# Patient Record
Sex: Female | Born: 1955
Health system: Southern US, Community
[De-identification: ages and names within clinical notes are randomized; demographics above are authoritative.]

## PROBLEM LIST (undated history)

## (undated) DIAGNOSIS — T4145XA Adverse effect of unspecified anesthetic, initial encounter: Secondary | ICD-10-CM

## (undated) DIAGNOSIS — T8859XA Other complications of anesthesia, initial encounter: Secondary | ICD-10-CM

## (undated) DIAGNOSIS — E119 Type 2 diabetes mellitus without complications: Secondary | ICD-10-CM

## (undated) DIAGNOSIS — N951 Menopausal and female climacteric states: Secondary | ICD-10-CM

## (undated) DIAGNOSIS — M7750 Other enthesopathy of unspecified foot: Secondary | ICD-10-CM

## (undated) DIAGNOSIS — M109 Gout, unspecified: Secondary | ICD-10-CM

## (undated) DIAGNOSIS — R740 Nonspecific elevation of levels of transaminase and lactic acid dehydrogenase [LDH]: Secondary | ICD-10-CM

## (undated) DIAGNOSIS — K760 Fatty (change of) liver, not elsewhere classified: Principal | ICD-10-CM

## (undated) DIAGNOSIS — K589 Irritable bowel syndrome without diarrhea: Secondary | ICD-10-CM

## (undated) DIAGNOSIS — I10 Essential (primary) hypertension: Secondary | ICD-10-CM

## (undated) DIAGNOSIS — K529 Noninfective gastroenteritis and colitis, unspecified: Secondary | ICD-10-CM

## (undated) DIAGNOSIS — G35 Multiple sclerosis: Secondary | ICD-10-CM

## (undated) DIAGNOSIS — K7581 Nonalcoholic steatohepatitis (NASH): Secondary | ICD-10-CM

## (undated) DIAGNOSIS — Z86010 Personal history of colonic polyps: Secondary | ICD-10-CM

## (undated) DIAGNOSIS — Z973 Presence of spectacles and contact lenses: Secondary | ICD-10-CM

## (undated) DIAGNOSIS — K219 Gastro-esophageal reflux disease without esophagitis: Secondary | ICD-10-CM

## (undated) DIAGNOSIS — Z9889 Other specified postprocedural states: Secondary | ICD-10-CM

## (undated) DIAGNOSIS — B009 Herpesviral infection, unspecified: Secondary | ICD-10-CM

## (undated) DIAGNOSIS — E785 Hyperlipidemia, unspecified: Secondary | ICD-10-CM

## (undated) DIAGNOSIS — E039 Hypothyroidism, unspecified: Secondary | ICD-10-CM

## (undated) DIAGNOSIS — I82409 Acute embolism and thrombosis of unspecified deep veins of unspecified lower extremity: Secondary | ICD-10-CM

## (undated) DIAGNOSIS — R112 Nausea with vomiting, unspecified: Secondary | ICD-10-CM

## (undated) DIAGNOSIS — I2699 Other pulmonary embolism without acute cor pulmonale: Secondary | ICD-10-CM

## (undated) DIAGNOSIS — G589 Mononeuropathy, unspecified: Secondary | ICD-10-CM

## (undated) DIAGNOSIS — R7401 Elevation of levels of liver transaminase levels: Secondary | ICD-10-CM

## (undated) DIAGNOSIS — E8840 Mitochondrial metabolism disorder, unspecified: Secondary | ICD-10-CM

## (undated) HISTORY — DX: Menopausal and female climacteric states: N95.1

## (undated) HISTORY — DX: Irritable bowel syndrome, unspecified: K58.9

## (undated) HISTORY — DX: Gastro-esophageal reflux disease without esophagitis: K21.9

## (undated) HISTORY — DX: Herpesviral infection, unspecified: B00.9

## (undated) HISTORY — DX: Fatty (change of) liver, not elsewhere classified: K76.0

## (undated) HISTORY — DX: Mitochondrial metabolism disorder, unspecified: E88.40

## (undated) HISTORY — DX: Multiple sclerosis: G35

## (undated) HISTORY — DX: Noninfective gastroenteritis and colitis, unspecified: K52.9

## (undated) HISTORY — DX: Gout, unspecified: M10.9

## (undated) HISTORY — DX: Type 2 diabetes mellitus without complications: E11.9

## (undated) HISTORY — DX: Nonspecific elevation of levels of transaminase and lactic acid dehydrogenase (ldh): R74.0

## (undated) HISTORY — DX: Acute embolism and thrombosis of unspecified deep veins of unspecified lower extremity: I82.409

## (undated) HISTORY — DX: Essential (primary) hypertension: I10

## (undated) HISTORY — DX: Hyperlipidemia, unspecified: E78.5

## (undated) HISTORY — DX: Hypothyroidism, unspecified: E03.9

## (undated) HISTORY — PX: COLONOSCOPY: SHX174

## (undated) HISTORY — DX: Other enthesopathy of unspecified foot and ankle: M77.50

## (undated) HISTORY — DX: Other pulmonary embolism without acute cor pulmonale: I26.99

## (undated) HISTORY — PX: TONSILLECTOMY: SUR1361

## (undated) HISTORY — DX: Elevation of levels of liver transaminase levels: R74.01

## (undated) HISTORY — DX: Mononeuropathy, unspecified: G58.9

---

## 1988-06-23 HISTORY — PX: THYROIDECTOMY, PARTIAL: SHX18

## 2002-06-23 HISTORY — PX: ABDOMINOPLASTY: SUR9

## 2002-06-23 HISTORY — PX: CHOLECYSTECTOMY: SHX55

## 2005-06-12 ENCOUNTER — Other Ambulatory Visit: Admission: RE | Admit: 2005-06-12 | Discharge: 2005-06-12 | Payer: Self-pay | Admitting: Internal Medicine

## 2005-08-18 ENCOUNTER — Encounter: Admission: RE | Admit: 2005-08-18 | Discharge: 2005-08-18 | Payer: Self-pay | Admitting: Internal Medicine

## 2005-10-07 ENCOUNTER — Encounter: Admission: RE | Admit: 2005-10-07 | Discharge: 2005-10-07 | Payer: Self-pay | Admitting: Internal Medicine

## 2005-12-26 ENCOUNTER — Emergency Department (HOSPITAL_COMMUNITY): Admission: EM | Admit: 2005-12-26 | Discharge: 2005-12-26 | Payer: Self-pay | Admitting: Emergency Medicine

## 2006-01-27 ENCOUNTER — Ambulatory Visit (HOSPITAL_COMMUNITY): Admission: RE | Admit: 2006-01-27 | Discharge: 2006-01-27 | Payer: Self-pay | Admitting: Internal Medicine

## 2006-04-03 ENCOUNTER — Ambulatory Visit (HOSPITAL_COMMUNITY): Admission: RE | Admit: 2006-04-03 | Discharge: 2006-04-03 | Payer: Self-pay | Admitting: Internal Medicine

## 2006-10-02 ENCOUNTER — Encounter: Admission: RE | Admit: 2006-10-02 | Discharge: 2006-10-02 | Payer: Self-pay | Admitting: Gastroenterology

## 2006-11-04 ENCOUNTER — Encounter (INDEPENDENT_AMBULATORY_CARE_PROVIDER_SITE_OTHER): Payer: Self-pay | Admitting: Specialist

## 2006-11-04 ENCOUNTER — Ambulatory Visit (HOSPITAL_COMMUNITY): Admission: RE | Admit: 2006-11-04 | Discharge: 2006-11-04 | Payer: Self-pay | Admitting: Gastroenterology

## 2006-11-22 DIAGNOSIS — I2699 Other pulmonary embolism without acute cor pulmonale: Secondary | ICD-10-CM

## 2006-11-22 DIAGNOSIS — I82409 Acute embolism and thrombosis of unspecified deep veins of unspecified lower extremity: Secondary | ICD-10-CM

## 2006-11-22 HISTORY — PX: OTHER SURGICAL HISTORY: SHX169

## 2006-11-22 HISTORY — DX: Acute embolism and thrombosis of unspecified deep veins of unspecified lower extremity: I82.409

## 2006-11-22 HISTORY — DX: Other pulmonary embolism without acute cor pulmonale: I26.99

## 2006-12-04 ENCOUNTER — Encounter (INDEPENDENT_AMBULATORY_CARE_PROVIDER_SITE_OTHER): Payer: Self-pay | Admitting: Surgery

## 2006-12-04 ENCOUNTER — Inpatient Hospital Stay (HOSPITAL_COMMUNITY): Admission: RE | Admit: 2006-12-04 | Discharge: 2006-12-10 | Payer: Self-pay | Admitting: Surgery

## 2006-12-13 ENCOUNTER — Encounter (INDEPENDENT_AMBULATORY_CARE_PROVIDER_SITE_OTHER): Payer: Self-pay | Admitting: Surgery

## 2006-12-13 ENCOUNTER — Inpatient Hospital Stay (HOSPITAL_COMMUNITY): Admission: EM | Admit: 2006-12-13 | Discharge: 2006-12-28 | Payer: Self-pay | Admitting: Emergency Medicine

## 2006-12-13 ENCOUNTER — Ambulatory Visit: Payer: Self-pay | Admitting: Pulmonary Disease

## 2006-12-28 ENCOUNTER — Encounter: Payer: Self-pay | Admitting: Pulmonary Disease

## 2006-12-28 ENCOUNTER — Ambulatory Visit: Payer: Self-pay | Admitting: Vascular Surgery

## 2006-12-29 ENCOUNTER — Ambulatory Visit: Payer: Self-pay | Admitting: Pulmonary Disease

## 2006-12-30 ENCOUNTER — Ambulatory Visit: Payer: Self-pay | Admitting: Pulmonary Disease

## 2006-12-31 ENCOUNTER — Ambulatory Visit: Payer: Self-pay | Admitting: Pulmonary Disease

## 2006-12-31 LAB — CONVERTED CEMR LAB: Prothrombin Time: 16.2 s — ABNORMAL HIGH (ref 10.0–14.0)

## 2007-01-01 ENCOUNTER — Encounter: Admission: RE | Admit: 2007-01-01 | Discharge: 2007-01-01 | Payer: Self-pay | Admitting: Surgery

## 2007-01-01 ENCOUNTER — Ambulatory Visit: Payer: Self-pay | Admitting: Pulmonary Disease

## 2007-01-01 LAB — CONVERTED CEMR LAB
AST: 36 units/L (ref 0–37)
Albumin: 3.1 g/dL — ABNORMAL LOW (ref 3.5–5.2)
Alkaline Phosphatase: 213 units/L — ABNORMAL HIGH (ref 39–117)
BUN: 9 mg/dL (ref 6–23)
Basophils Absolute: 0 10*3/uL (ref 0.0–0.1)
Basophils Relative: 0.1 % (ref 0.0–1.0)
CO2: 22 meq/L (ref 19–32)
Chloride: 102 meq/L (ref 96–112)
Creatinine, Ser: 0.7 mg/dL (ref 0.4–1.2)
HCT: 35.2 % — ABNORMAL LOW (ref 36.0–46.0)
Hemoglobin: 12.1 g/dL (ref 12.0–15.0)
INR: 1.8 (ref 0.9–2.0)
Monocytes Absolute: 0.4 10*3/uL (ref 0.2–0.7)
Neutrophils Relative %: 71.8 % (ref 43.0–77.0)
Potassium: 5.1 meq/L (ref 3.5–5.1)
Prothrombin Time: 16.6 s — ABNORMAL HIGH (ref 10.0–14.0)
RBC: 4.01 M/uL (ref 3.87–5.11)
RDW: 14.4 % (ref 11.5–14.6)
Sodium: 138 meq/L (ref 135–145)
Total Bilirubin: 1.3 mg/dL — ABNORMAL HIGH (ref 0.3–1.2)
Total Protein: 7.5 g/dL (ref 6.0–8.3)

## 2007-01-04 ENCOUNTER — Ambulatory Visit: Payer: Self-pay | Admitting: Pulmonary Disease

## 2007-01-04 LAB — CONVERTED CEMR LAB
Basophils Absolute: 0.1 10*3/uL (ref 0.0–0.1)
Eosinophils Absolute: 0.2 10*3/uL (ref 0.0–0.6)
Eosinophils Relative: 3.2 % (ref 0.0–5.0)
HCT: 37.4 % (ref 36.0–46.0)
Hemoglobin: 12.8 g/dL (ref 12.0–15.0)
Lymphocytes Relative: 21.3 % (ref 12.0–46.0)
MCHC: 34.3 g/dL (ref 30.0–36.0)
MCV: 87.5 fL (ref 78.0–100.0)
Monocytes Absolute: 0.5 10*3/uL (ref 0.2–0.7)
Neutro Abs: 4.2 10*3/uL (ref 1.4–7.7)
Neutrophils Relative %: 66.8 % (ref 43.0–77.0)
Prothrombin Time: 22.9 s — ABNORMAL HIGH (ref 10.0–14.0)
WBC: 6.4 10*3/uL (ref 4.5–10.5)

## 2007-01-05 ENCOUNTER — Ambulatory Visit: Payer: Self-pay

## 2007-01-06 ENCOUNTER — Ambulatory Visit: Payer: Self-pay

## 2007-01-07 ENCOUNTER — Encounter: Admission: RE | Admit: 2007-01-07 | Discharge: 2007-01-07 | Payer: Self-pay | Admitting: Surgery

## 2007-01-10 ENCOUNTER — Emergency Department (HOSPITAL_COMMUNITY): Admission: EM | Admit: 2007-01-10 | Discharge: 2007-01-10 | Payer: Self-pay | Admitting: Emergency Medicine

## 2007-01-12 ENCOUNTER — Ambulatory Visit: Payer: Self-pay | Admitting: Pulmonary Disease

## 2007-01-15 ENCOUNTER — Ambulatory Visit: Payer: Self-pay | Admitting: Pulmonary Disease

## 2007-01-15 LAB — CONVERTED CEMR LAB
Basophils Relative: 0.9 % (ref 0.0–1.0)
Eosinophils Absolute: 0.2 10*3/uL (ref 0.0–0.6)
Eosinophils Relative: 3.6 % (ref 0.0–5.0)
HCT: 35.1 % — ABNORMAL LOW (ref 36.0–46.0)
Hemoglobin: 12.2 g/dL (ref 12.0–15.0)
INR: 2.2 — ABNORMAL HIGH (ref 0.9–2.0)
Lymphocytes Relative: 20 % (ref 12.0–46.0)
MCV: 86.7 fL (ref 78.0–100.0)
Platelets: 412 10*3/uL — ABNORMAL HIGH (ref 150–400)
RBC: 4.05 M/uL (ref 3.87–5.11)

## 2007-01-21 ENCOUNTER — Ambulatory Visit: Payer: Self-pay | Admitting: Pulmonary Disease

## 2007-01-21 LAB — CONVERTED CEMR LAB
INR: 2.1 — ABNORMAL HIGH (ref 0.9–2.0)
Prothrombin Time: 18.1 s — ABNORMAL HIGH (ref 10.0–14.0)

## 2007-01-26 ENCOUNTER — Ambulatory Visit: Payer: Self-pay | Admitting: Pulmonary Disease

## 2007-01-26 LAB — CONVERTED CEMR LAB: INR: 2.2 — ABNORMAL HIGH (ref 0.9–2.0)

## 2007-02-17 ENCOUNTER — Ambulatory Visit: Payer: Self-pay | Admitting: Pulmonary Disease

## 2007-02-17 LAB — CONVERTED CEMR LAB
INR: 1.6 — ABNORMAL HIGH (ref 0.8–1.0)
Prothrombin Time: 15.8 s — ABNORMAL HIGH (ref 10.9–13.3)

## 2007-02-18 ENCOUNTER — Ambulatory Visit: Payer: Self-pay | Admitting: Pulmonary Disease

## 2007-02-18 LAB — CONVERTED CEMR LAB
ALT: 80 units/L — ABNORMAL HIGH (ref 0–35)
AST: 60 units/L — ABNORMAL HIGH (ref 0–37)
Albumin: 3.6 g/dL (ref 3.5–5.2)
Alkaline Phosphatase: 149 units/L — ABNORMAL HIGH (ref 39–117)
BUN: 9 mg/dL (ref 6–23)
Basophils Relative: 0.3 % (ref 0.0–1.0)
GFR calc Af Amer: 136 mL/min
GFR calc non Af Amer: 112 mL/min
Monocytes Relative: 12.2 % — ABNORMAL HIGH (ref 3.0–11.0)
Platelets: 267 10*3/uL (ref 150–400)
Potassium: 3.6 meq/L (ref 3.5–5.1)
RDW: 17.4 % — ABNORMAL HIGH (ref 11.5–14.6)
Total Bilirubin: 0.6 mg/dL (ref 0.3–1.2)
Total Protein: 6.9 g/dL (ref 6.0–8.3)

## 2007-02-26 ENCOUNTER — Ambulatory Visit: Payer: Self-pay | Admitting: Pulmonary Disease

## 2007-02-26 LAB — CONVERTED CEMR LAB: Prothrombin Time: 19.2 s — ABNORMAL HIGH (ref 10.9–13.3)

## 2007-03-29 ENCOUNTER — Ambulatory Visit: Payer: Self-pay | Admitting: Pulmonary Disease

## 2007-03-29 LAB — CONVERTED CEMR LAB
INR: 1.9 — ABNORMAL HIGH (ref 0.8–1.0)
Prothrombin Time: 17 s — ABNORMAL HIGH (ref 10.9–13.3)

## 2007-04-06 DIAGNOSIS — I82409 Acute embolism and thrombosis of unspecified deep veins of unspecified lower extremity: Secondary | ICD-10-CM | POA: Insufficient documentation

## 2007-04-06 DIAGNOSIS — E039 Hypothyroidism, unspecified: Secondary | ICD-10-CM | POA: Insufficient documentation

## 2007-04-06 DIAGNOSIS — G35 Multiple sclerosis: Secondary | ICD-10-CM

## 2007-04-06 DIAGNOSIS — F411 Generalized anxiety disorder: Secondary | ICD-10-CM | POA: Insufficient documentation

## 2007-04-06 DIAGNOSIS — K219 Gastro-esophageal reflux disease without esophagitis: Secondary | ICD-10-CM | POA: Insufficient documentation

## 2007-04-07 ENCOUNTER — Ambulatory Visit: Payer: Self-pay | Admitting: Pulmonary Disease

## 2007-04-10 DIAGNOSIS — I2699 Other pulmonary embolism without acute cor pulmonale: Secondary | ICD-10-CM

## 2007-04-24 HISTORY — PX: OTHER SURGICAL HISTORY: SHX169

## 2007-04-28 ENCOUNTER — Encounter (INDEPENDENT_AMBULATORY_CARE_PROVIDER_SITE_OTHER): Payer: Self-pay | Admitting: Surgery

## 2007-04-28 ENCOUNTER — Inpatient Hospital Stay (HOSPITAL_COMMUNITY): Admission: RE | Admit: 2007-04-28 | Discharge: 2007-05-03 | Payer: Self-pay | Admitting: Surgery

## 2007-05-07 ENCOUNTER — Encounter: Payer: Self-pay | Admitting: Cardiology

## 2007-05-07 LAB — CONVERTED CEMR LAB
INR: 1.1 — ABNORMAL HIGH (ref 0.8–1.0)
Prothrombin Time: 12.9 s (ref 10.9–13.3)

## 2007-05-10 ENCOUNTER — Encounter: Payer: Self-pay | Admitting: Pulmonary Disease

## 2007-05-10 ENCOUNTER — Encounter: Payer: Self-pay | Admitting: Cardiology

## 2007-05-10 LAB — CONVERTED CEMR LAB
Prothrombin Time: 16.1 s — ABNORMAL HIGH (ref 10.9–13.3)
Prothrombin Time: 16.1 s — ABNORMAL HIGH (ref 10.9–13.3)

## 2007-05-12 ENCOUNTER — Ambulatory Visit: Payer: Self-pay

## 2007-05-12 LAB — CONVERTED CEMR LAB: INR: 1.9 — ABNORMAL HIGH (ref 0.8–1.0)

## 2007-05-14 ENCOUNTER — Ambulatory Visit: Payer: Self-pay | Admitting: Pulmonary Disease

## 2007-05-24 ENCOUNTER — Ambulatory Visit: Payer: Self-pay | Admitting: Pulmonary Disease

## 2007-05-24 LAB — CONVERTED CEMR LAB: INR: 2.5 — ABNORMAL HIGH (ref 0.8–1.0)

## 2007-06-04 ENCOUNTER — Ambulatory Visit: Payer: Self-pay | Admitting: Pulmonary Disease

## 2007-06-04 LAB — CONVERTED CEMR LAB: Prothrombin Time: 16.5 s — ABNORMAL HIGH (ref 10.9–13.3)

## 2007-06-07 LAB — CONVERTED CEMR LAB: INR: 1.8 — ABNORMAL HIGH (ref 0.8–1.0)

## 2007-06-11 ENCOUNTER — Ambulatory Visit: Payer: Self-pay | Admitting: Pulmonary Disease

## 2007-07-14 ENCOUNTER — Ambulatory Visit: Payer: Self-pay | Admitting: Pulmonary Disease

## 2007-07-14 LAB — CONVERTED CEMR LAB: Prothrombin Time: 17.1 s — ABNORMAL HIGH (ref 10.9–13.3)

## 2007-07-21 ENCOUNTER — Encounter: Payer: Self-pay | Admitting: Pulmonary Disease

## 2007-07-21 ENCOUNTER — Ambulatory Visit: Payer: Self-pay | Admitting: Pulmonary Disease

## 2007-08-04 ENCOUNTER — Ambulatory Visit: Payer: Self-pay | Admitting: Pulmonary Disease

## 2007-08-25 ENCOUNTER — Ambulatory Visit: Payer: Self-pay | Admitting: Pulmonary Disease

## 2007-08-25 LAB — CONVERTED CEMR LAB
INR: 2.3 — ABNORMAL HIGH (ref 0.8–1.0)
Prothrombin Time: 18.9 s — ABNORMAL HIGH (ref 10.9–13.3)

## 2007-09-03 ENCOUNTER — Ambulatory Visit: Payer: Self-pay | Admitting: Internal Medicine

## 2007-09-07 ENCOUNTER — Encounter: Payer: Self-pay | Admitting: Internal Medicine

## 2007-10-04 ENCOUNTER — Ambulatory Visit: Payer: Self-pay | Admitting: Cardiology

## 2008-02-10 ENCOUNTER — Ambulatory Visit: Payer: Self-pay | Admitting: Pulmonary Disease

## 2008-02-10 LAB — CONVERTED CEMR LAB: Prothrombin Time: 25.1 s — ABNORMAL HIGH (ref 10.9–13.3)

## 2008-02-14 ENCOUNTER — Encounter: Admission: RE | Admit: 2008-02-14 | Discharge: 2008-02-14 | Payer: Self-pay | Admitting: Internal Medicine

## 2008-03-30 ENCOUNTER — Ambulatory Visit (HOSPITAL_COMMUNITY): Admission: RE | Admit: 2008-03-30 | Discharge: 2008-03-30 | Payer: Self-pay | Admitting: Internal Medicine

## 2008-04-05 ENCOUNTER — Encounter: Admission: RE | Admit: 2008-04-05 | Discharge: 2008-04-05 | Payer: Self-pay | Admitting: Internal Medicine

## 2008-04-10 IMAGING — CT CT ABDOMEN W/ CM
2 of 5 series · 16 of 46 positions shown, 18 images · IV contrast (READICAT/WATER)
Comparison: 01/01/2007

ABDOMEN CT WITH CONTRAST:

CLINICAL DATA: Right colectomy. Abscess.
TECHNIQUE: Multidetector CT imaging of the abdomen and pelvis was performed
following the standard protocol during bolus administration of intravenous
contrast.

Contrast:  100 cc Omnipaque 300

[Series 2: abdomen w/ · axial · 0.86mm/px · z∈[-422,+3]mm · 13 of 97 slices shown, 15 images]
[im 6/97  soft-tissue]
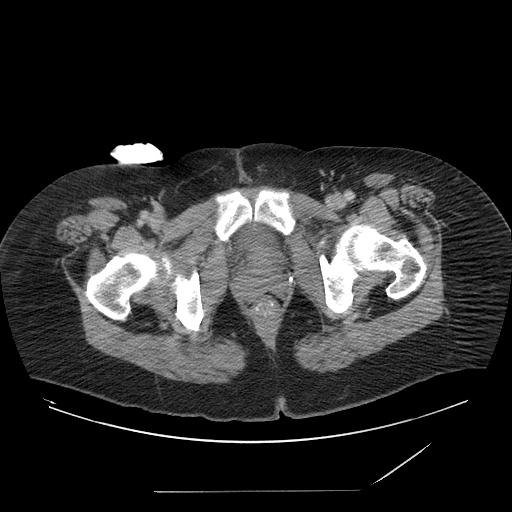
[im 6/97  bone]
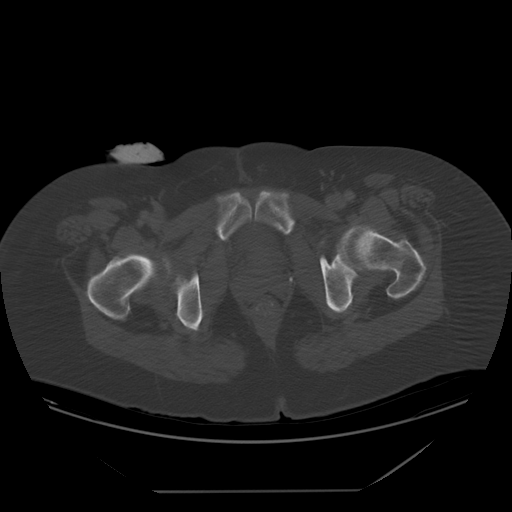
[im 16/97  soft-tissue]
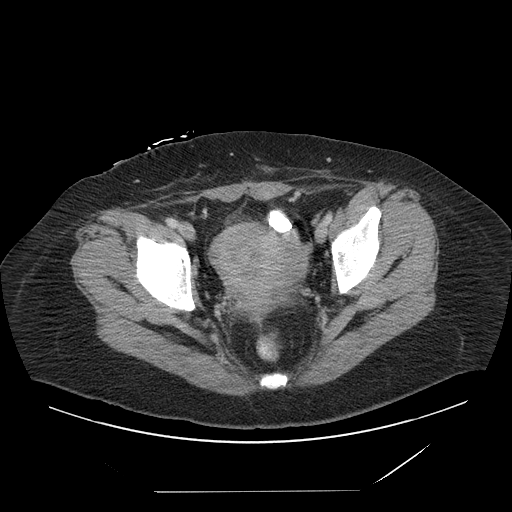
[im 21/97  soft-tissue]
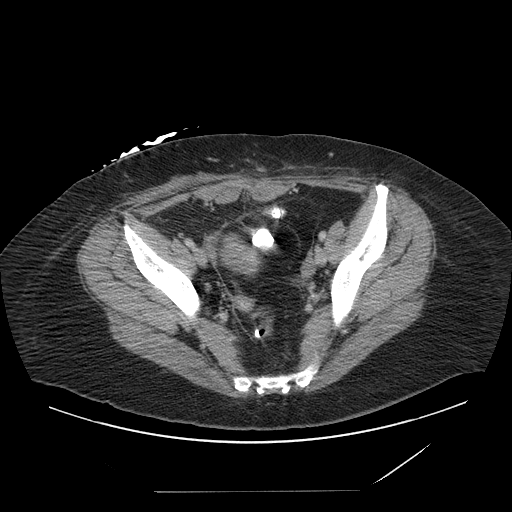
[im 26/97  soft-tissue]
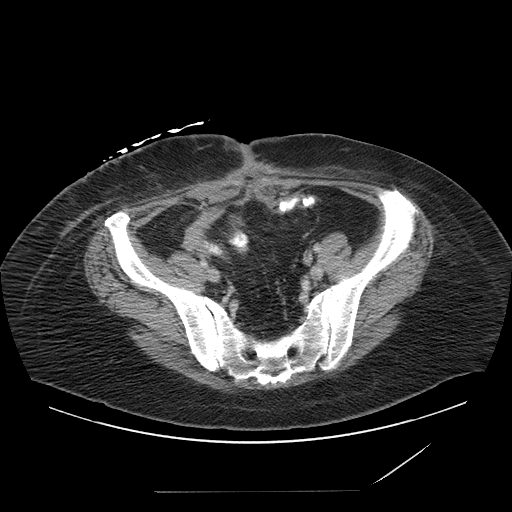
[im 36/97  soft-tissue]
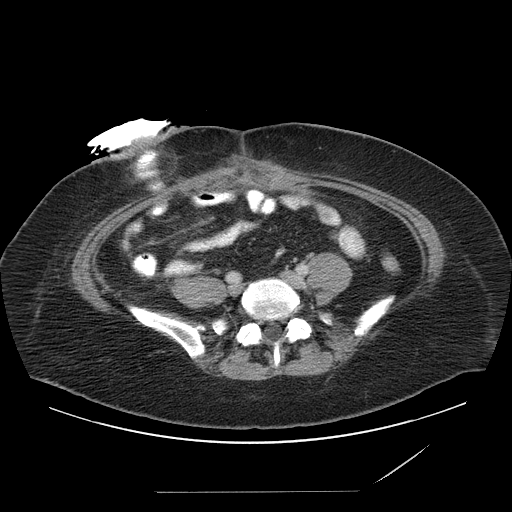
[im 41/97  soft-tissue]
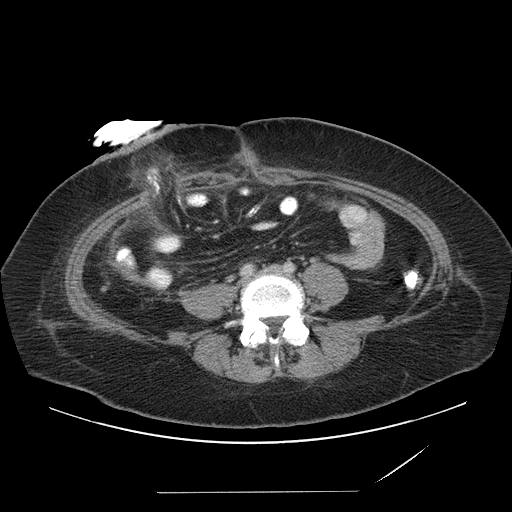
[im 51/97  soft-tissue]
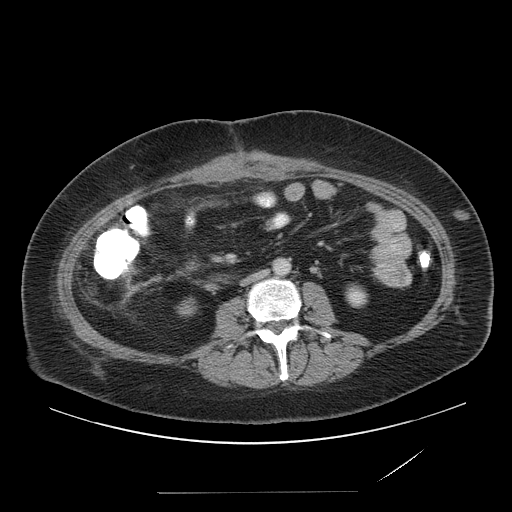
[im 56/97  soft-tissue]
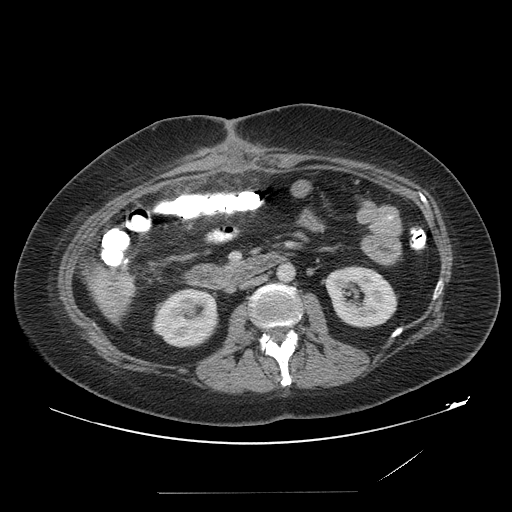
[im 61/97  soft-tissue]
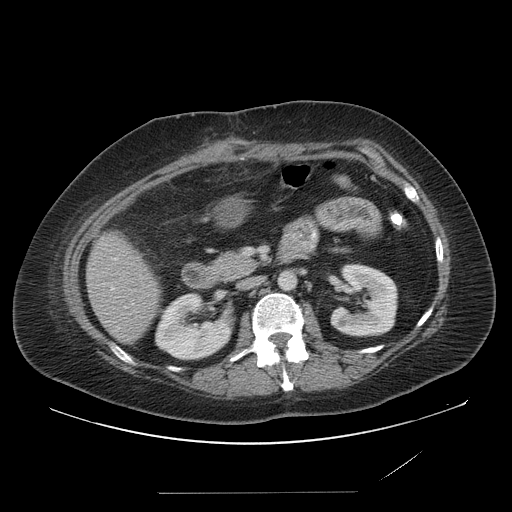
[im 61/97  bone]
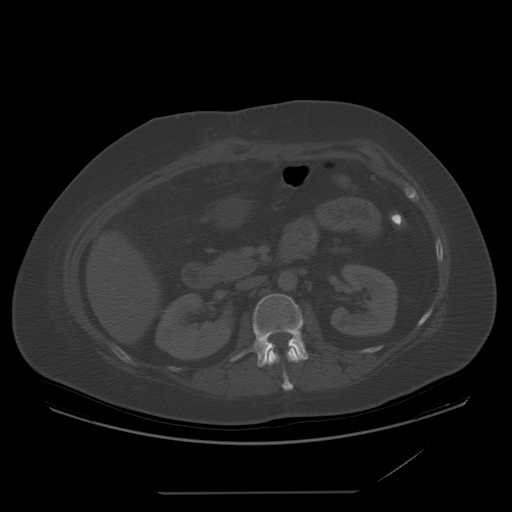
[im 71/97  soft-tissue]
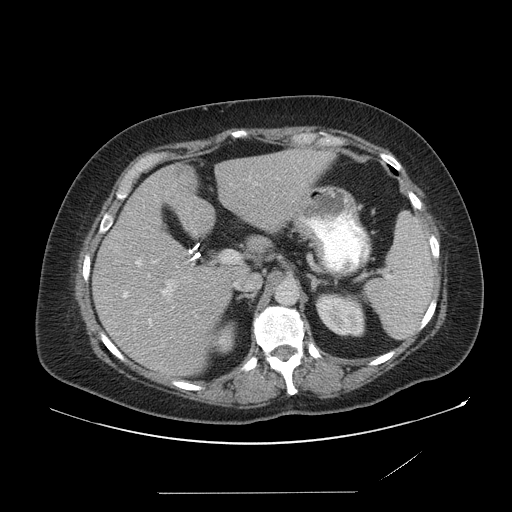
[im 76/97  soft-tissue]
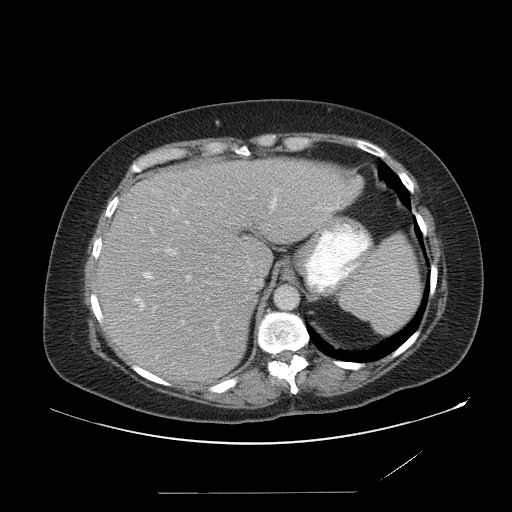
[im 81/97  soft-tissue]
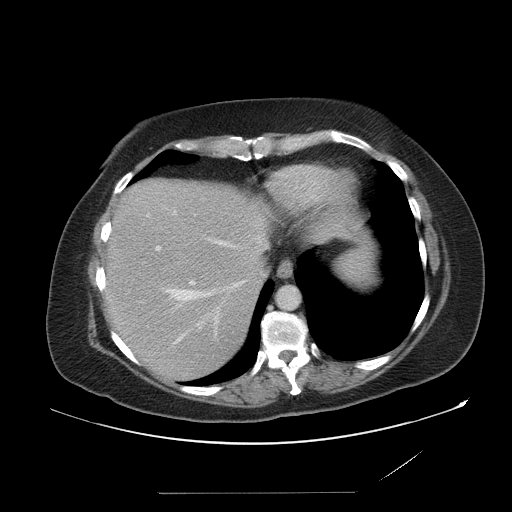
[im 91/97  soft-tissue]
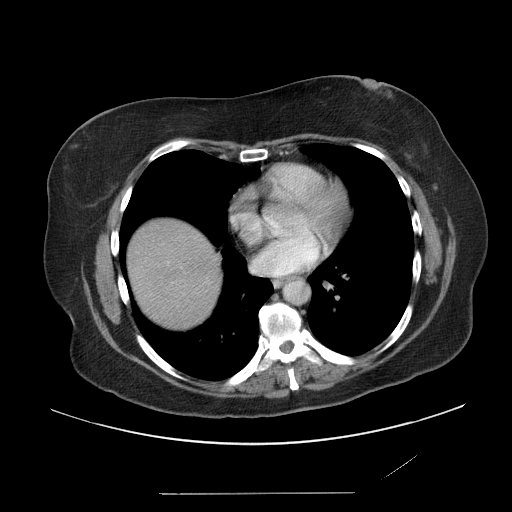

[Series 401: coronal · coronal · 0.99mm/px · 3 of 137 slices shown]
[im 46/137  soft-tissue]
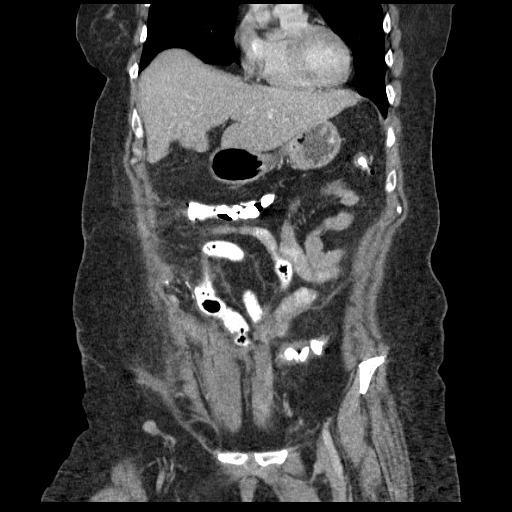
[im 61/137  soft-tissue]
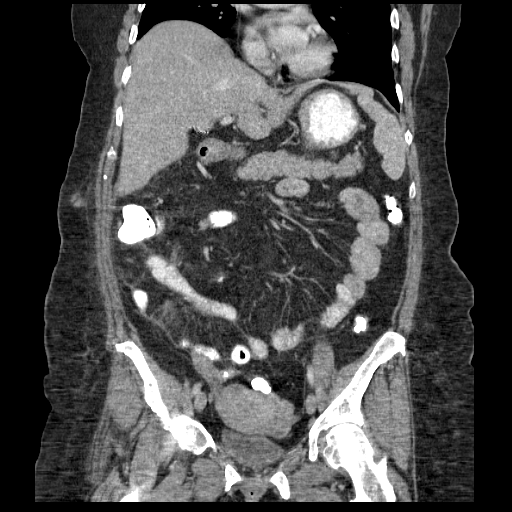
[im 76/137  soft-tissue]
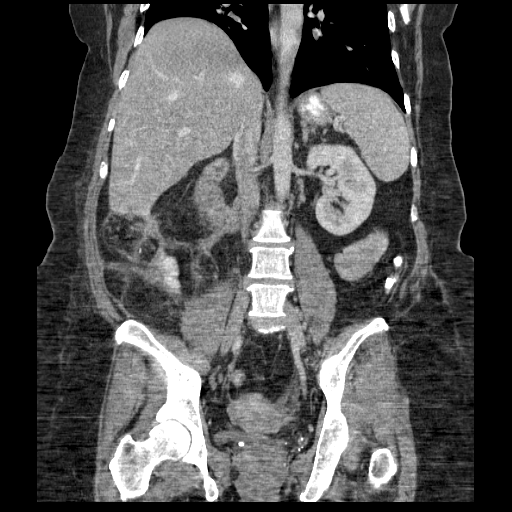

[16 of 46 positions shown; findings below may reference images not displayed]

FINDINGS: Focal low attenuation in the liver at the falciform ligament is in a
characteristic location for focal fatty infiltration. The spleen, stomach,
duodenum, pancreas, adrenal glands, and kidneys have normal imaging features.
Small lymph nodes in the hepatoduodenal ligament are stable. There is no
abdominal aortic aneurysm.

There is a persistent soft tissue focus in the right colectomy bed on today's
study, measuring 3.9 x 1.1 cm compared to 4.1 x 1.7 cm previously. I cannot say
at this time that this is a fluid collection and may represent an area of
residual inflammation/granulation. It does appear slightly smaller than on the
previous exam.

There is ill-definition of fascial planes in the midline anterior abdominal
wall. Several tiny air locules are seen in the soft tissues superficial to the
rectus fascia, at the midline. This may be related to a residual midline wound.
The patient did appear to have some packing material in the midline on the
previous study. There is not a focal fluid collection in the ventral abdominal
wall to suggest an associated abdominal wall abscess.
IMPRESSION: Interval slight decrease in size of the previous small fluid collection in the
right colectomy bed. This does not have features on today's study for a definite
fluid collection at this location and may represent focal
inflammation/granulation. 

There is persistent gas in the midline anterior abdominal wall presumably
secondary to a midline wound. No associated fluid collection is identified in
this region although infection is a possibility.

PELVIS CT WITH CONTRAST:
FINDINGS: There is no intraperitoneal free fluid. No pelvic lymphadenopathy.
Fibroid changes seen in the uterus. There is no adnexal mass. Right lower
quadrant ileostomy is stable. Some mild stranding in the deep right hemipelvis
is stable in the interval.

Degenerative changes are noted in the facets of the lower lumbar spine.
IMPRESSION: No appreciable interval change.

## 2008-04-27 ENCOUNTER — Ambulatory Visit (HOSPITAL_COMMUNITY): Admission: RE | Admit: 2008-04-27 | Discharge: 2008-04-27 | Payer: Self-pay | Admitting: Internal Medicine

## 2008-05-03 ENCOUNTER — Ambulatory Visit: Payer: Self-pay | Admitting: Cardiology

## 2008-05-19 ENCOUNTER — Encounter: Payer: Self-pay | Admitting: Cardiology

## 2008-05-19 ENCOUNTER — Ambulatory Visit: Payer: Self-pay | Admitting: Cardiology

## 2008-08-08 ENCOUNTER — Inpatient Hospital Stay (HOSPITAL_COMMUNITY): Admission: RE | Admit: 2008-08-08 | Discharge: 2008-08-11 | Payer: Self-pay | Admitting: Surgery

## 2008-08-17 ENCOUNTER — Encounter: Payer: Self-pay | Admitting: Internal Medicine

## 2008-09-06 ENCOUNTER — Encounter: Payer: Self-pay | Admitting: Internal Medicine

## 2008-09-21 ENCOUNTER — Telehealth: Payer: Self-pay | Admitting: Internal Medicine

## 2008-09-21 DIAGNOSIS — Z860100 Personal history of colon polyps, unspecified: Secondary | ICD-10-CM | POA: Insufficient documentation

## 2008-09-21 HISTORY — DX: Personal history of colon polyps, unspecified: Z86.0100

## 2008-09-21 HISTORY — DX: Personal history of colonic polyps: Z86.010

## 2008-09-25 ENCOUNTER — Encounter: Payer: Self-pay | Admitting: Internal Medicine

## 2008-09-28 ENCOUNTER — Encounter: Admission: RE | Admit: 2008-09-28 | Discharge: 2008-09-28 | Payer: Self-pay | Admitting: Internal Medicine

## 2008-10-04 ENCOUNTER — Encounter (INDEPENDENT_AMBULATORY_CARE_PROVIDER_SITE_OTHER): Payer: Self-pay

## 2008-10-19 ENCOUNTER — Encounter (INDEPENDENT_AMBULATORY_CARE_PROVIDER_SITE_OTHER): Payer: Self-pay

## 2008-11-16 ENCOUNTER — Encounter: Payer: Self-pay | Admitting: Internal Medicine

## 2008-11-17 ENCOUNTER — Ambulatory Visit: Payer: Self-pay | Admitting: Internal Medicine

## 2008-11-17 ENCOUNTER — Encounter: Payer: Self-pay | Admitting: Internal Medicine

## 2008-11-17 ENCOUNTER — Ambulatory Visit (HOSPITAL_COMMUNITY): Admission: RE | Admit: 2008-11-17 | Discharge: 2008-11-17 | Payer: Self-pay | Admitting: Internal Medicine

## 2008-11-23 DIAGNOSIS — E785 Hyperlipidemia, unspecified: Secondary | ICD-10-CM | POA: Insufficient documentation

## 2008-11-24 ENCOUNTER — Ambulatory Visit: Payer: Self-pay | Admitting: Internal Medicine

## 2008-12-01 LAB — CONVERTED CEMR LAB
Cholesterol: 229 mg/dL — ABNORMAL HIGH (ref 0–200)
HDL: 30.2 mg/dL — ABNORMAL LOW (ref >39.00)
Total CHOL/HDL Ratio: 8
Triglycerides: 310 mg/dL — ABNORMAL HIGH (ref 0.0–149.0)
VLDL: 62 mg/dL — ABNORMAL HIGH (ref 0.0–40.0)

## 2009-02-07 ENCOUNTER — Ambulatory Visit: Payer: Self-pay | Admitting: Internal Medicine

## 2009-02-08 LAB — CONVERTED CEMR LAB
Basophils Absolute: 0 10*3/uL (ref 0.0–0.1)
Chloride: 101 meq/L (ref 96–112)
Eosinophils Absolute: 0.2 10*3/uL (ref 0.0–0.7)
GFR calc non Af Amer: 69.38 mL/min (ref >60)
Lymphocytes Relative: 20.9 % (ref 12.0–46.0)
MCHC: 33.4 g/dL (ref 30.0–36.0)
Neutrophils Relative %: 69.3 % (ref 43.0–77.0)
Platelets: 311 10*3/uL (ref 150.0–400.0)
Potassium: 3.5 meq/L (ref 3.5–5.1)
RDW: 13.5 % (ref 11.5–14.6)

## 2009-02-16 ENCOUNTER — Ambulatory Visit: Payer: Self-pay | Admitting: Internal Medicine

## 2009-02-16 ENCOUNTER — Encounter (INDEPENDENT_AMBULATORY_CARE_PROVIDER_SITE_OTHER): Payer: Self-pay

## 2009-02-16 DIAGNOSIS — E119 Type 2 diabetes mellitus without complications: Secondary | ICD-10-CM

## 2009-02-16 DIAGNOSIS — I10 Essential (primary) hypertension: Secondary | ICD-10-CM

## 2009-02-16 LAB — CONVERTED CEMR LAB
Cholesterol: 216 mg/dL — ABNORMAL HIGH (ref 0–200)
HDL: 32.3 mg/dL — ABNORMAL LOW (ref >39.00)
Hgb A1c MFr Bld: 6.1 % (ref 4.6–6.5)
VLDL: 67.4 mg/dL — ABNORMAL HIGH (ref 0.0–40.0)

## 2009-02-19 ENCOUNTER — Telehealth: Payer: Self-pay | Admitting: Internal Medicine

## 2009-02-21 ENCOUNTER — Telehealth: Payer: Self-pay | Admitting: Internal Medicine

## 2009-02-28 ENCOUNTER — Encounter: Payer: Self-pay | Admitting: Internal Medicine

## 2009-02-28 ENCOUNTER — Telehealth: Payer: Self-pay | Admitting: Internal Medicine

## 2009-03-27 ENCOUNTER — Ambulatory Visit: Payer: Self-pay | Admitting: Internal Medicine

## 2009-03-27 DIAGNOSIS — M722 Plantar fascial fibromatosis: Secondary | ICD-10-CM | POA: Insufficient documentation

## 2009-03-27 DIAGNOSIS — M109 Gout, unspecified: Secondary | ICD-10-CM

## 2009-04-16 ENCOUNTER — Encounter: Admission: RE | Admit: 2009-04-16 | Discharge: 2009-04-16 | Payer: Self-pay | Admitting: Internal Medicine

## 2009-06-11 ENCOUNTER — Ambulatory Visit: Payer: Self-pay | Admitting: Internal Medicine

## 2009-06-18 ENCOUNTER — Encounter: Payer: Self-pay | Admitting: Internal Medicine

## 2009-06-18 ENCOUNTER — Telehealth: Payer: Self-pay | Admitting: Internal Medicine

## 2009-06-18 ENCOUNTER — Encounter: Payer: Self-pay | Admitting: Sports Medicine

## 2009-06-18 DIAGNOSIS — N951 Menopausal and female climacteric states: Secondary | ICD-10-CM | POA: Insufficient documentation

## 2009-06-19 ENCOUNTER — Ambulatory Visit: Payer: Self-pay | Admitting: Internal Medicine

## 2009-06-25 ENCOUNTER — Ambulatory Visit: Payer: Self-pay | Admitting: Sports Medicine

## 2009-06-25 DIAGNOSIS — M7750 Other enthesopathy of unspecified foot: Secondary | ICD-10-CM | POA: Insufficient documentation

## 2009-07-05 ENCOUNTER — Telehealth: Payer: Self-pay | Admitting: Internal Medicine

## 2009-07-10 ENCOUNTER — Telehealth: Payer: Self-pay | Admitting: Internal Medicine

## 2009-07-16 ENCOUNTER — Encounter: Payer: Self-pay | Admitting: Internal Medicine

## 2009-07-23 ENCOUNTER — Ambulatory Visit: Payer: Self-pay | Admitting: Internal Medicine

## 2009-07-23 DIAGNOSIS — H6690 Otitis media, unspecified, unspecified ear: Secondary | ICD-10-CM | POA: Insufficient documentation

## 2009-07-26 ENCOUNTER — Encounter: Payer: Self-pay | Admitting: Internal Medicine

## 2009-08-23 ENCOUNTER — Encounter: Payer: Self-pay | Admitting: Internal Medicine

## 2009-09-15 ENCOUNTER — Encounter: Payer: Self-pay | Admitting: Emergency Medicine

## 2009-09-15 ENCOUNTER — Inpatient Hospital Stay (HOSPITAL_COMMUNITY): Admission: EM | Admit: 2009-09-15 | Discharge: 2009-09-16 | Payer: Self-pay | Admitting: Internal Medicine

## 2009-09-15 ENCOUNTER — Ambulatory Visit: Payer: Self-pay | Admitting: Interventional Radiology

## 2009-09-16 ENCOUNTER — Encounter: Payer: Self-pay | Admitting: Internal Medicine

## 2009-09-17 ENCOUNTER — Telehealth: Payer: Self-pay | Admitting: Internal Medicine

## 2009-09-26 ENCOUNTER — Telehealth: Payer: Self-pay | Admitting: Internal Medicine

## 2009-10-02 ENCOUNTER — Ambulatory Visit (HOSPITAL_COMMUNITY): Admission: RE | Admit: 2009-10-02 | Discharge: 2009-10-02 | Payer: Self-pay | Admitting: Psychiatry

## 2009-10-09 ENCOUNTER — Telehealth: Payer: Self-pay | Admitting: Internal Medicine

## 2009-10-15 ENCOUNTER — Encounter (INDEPENDENT_AMBULATORY_CARE_PROVIDER_SITE_OTHER): Payer: Self-pay | Admitting: *Deleted

## 2009-10-30 ENCOUNTER — Encounter: Payer: Self-pay | Admitting: Internal Medicine

## 2009-11-05 ENCOUNTER — Encounter: Admission: RE | Admit: 2009-11-05 | Discharge: 2009-11-05 | Payer: Self-pay | Admitting: Internal Medicine

## 2009-11-06 ENCOUNTER — Ambulatory Visit: Payer: Self-pay | Admitting: Family Medicine

## 2009-11-14 ENCOUNTER — Telehealth: Payer: Self-pay | Admitting: Internal Medicine

## 2009-11-20 ENCOUNTER — Ambulatory Visit: Payer: Self-pay | Admitting: Family Medicine

## 2009-11-26 ENCOUNTER — Telehealth: Payer: Self-pay | Admitting: Internal Medicine

## 2009-11-27 ENCOUNTER — Telehealth: Payer: Self-pay | Admitting: Internal Medicine

## 2009-12-05 ENCOUNTER — Telehealth: Payer: Self-pay | Admitting: Internal Medicine

## 2009-12-10 ENCOUNTER — Encounter: Payer: Self-pay | Admitting: Internal Medicine

## 2009-12-25 ENCOUNTER — Telehealth: Payer: Self-pay | Admitting: Internal Medicine

## 2009-12-27 ENCOUNTER — Encounter: Payer: Self-pay | Admitting: Internal Medicine

## 2009-12-28 ENCOUNTER — Telehealth: Payer: Self-pay | Admitting: Internal Medicine

## 2009-12-28 ENCOUNTER — Encounter: Payer: Self-pay | Admitting: Internal Medicine

## 2009-12-31 ENCOUNTER — Encounter: Payer: Self-pay | Admitting: Internal Medicine

## 2010-01-16 ENCOUNTER — Encounter: Payer: Self-pay | Admitting: Internal Medicine

## 2010-01-16 ENCOUNTER — Encounter (HOSPITAL_COMMUNITY)
Admission: RE | Admit: 2010-01-16 | Discharge: 2010-03-19 | Payer: Self-pay | Source: Home / Self Care | Admitting: Internal Medicine

## 2010-01-17 ENCOUNTER — Telehealth: Payer: Self-pay | Admitting: Internal Medicine

## 2010-01-23 ENCOUNTER — Telehealth: Payer: Self-pay | Admitting: Internal Medicine

## 2010-01-31 ENCOUNTER — Telehealth: Payer: Self-pay | Admitting: Internal Medicine

## 2010-02-04 ENCOUNTER — Telehealth: Payer: Self-pay | Admitting: Internal Medicine

## 2010-02-07 ENCOUNTER — Telehealth: Payer: Self-pay | Admitting: Internal Medicine

## 2010-02-11 ENCOUNTER — Encounter: Payer: Self-pay | Admitting: Internal Medicine

## 2010-02-11 ENCOUNTER — Telehealth: Payer: Self-pay | Admitting: Internal Medicine

## 2010-02-13 ENCOUNTER — Encounter: Payer: Self-pay | Admitting: Internal Medicine

## 2010-02-26 ENCOUNTER — Telehealth: Payer: Self-pay | Admitting: Internal Medicine

## 2010-03-13 ENCOUNTER — Encounter: Payer: Self-pay | Admitting: Internal Medicine

## 2010-04-03 ENCOUNTER — Encounter: Payer: Self-pay | Admitting: Internal Medicine

## 2010-04-03 LAB — CONVERTED CEMR LAB
ALT: 89 units/L
AST: 75 units/L
BUN: 11 mg/dL
Basophils Relative: 1 %
Chloride: 101 meq/L
Cholesterol: 149 mg/dL
Eosinophils Relative: 3 %
HCT: 38.9 %
HDL: 37 mg/dL
Monocytes Relative: 8 %
Neutrophils Relative %: 52 %
Potassium: 4 meq/L
RBC: 4.1 M/uL
Total Bilirubin: 0.7 mg/dL
Total Protein: 7.4 g/dL
Triglyceride fasting, serum: 199 mg/dL
WBC: 9 10*3/uL

## 2010-04-08 ENCOUNTER — Encounter: Payer: Self-pay | Admitting: Internal Medicine

## 2010-04-10 ENCOUNTER — Encounter (HOSPITAL_COMMUNITY)
Admission: RE | Admit: 2010-04-10 | Discharge: 2010-07-09 | Payer: Self-pay | Source: Home / Self Care | Attending: Internal Medicine | Admitting: Internal Medicine

## 2010-04-12 ENCOUNTER — Encounter: Payer: Self-pay | Admitting: Internal Medicine

## 2010-04-12 ENCOUNTER — Telehealth: Payer: Self-pay | Admitting: Internal Medicine

## 2010-04-23 ENCOUNTER — Telehealth: Payer: Self-pay | Admitting: Internal Medicine

## 2010-05-08 ENCOUNTER — Encounter: Payer: Self-pay | Admitting: Internal Medicine

## 2010-05-21 ENCOUNTER — Ambulatory Visit: Payer: Self-pay | Admitting: Internal Medicine

## 2010-05-21 ENCOUNTER — Encounter: Payer: Self-pay | Admitting: Internal Medicine

## 2010-05-22 ENCOUNTER — Ambulatory Visit: Payer: Self-pay | Admitting: Internal Medicine

## 2010-05-27 ENCOUNTER — Ambulatory Visit: Payer: Self-pay | Admitting: Internal Medicine

## 2010-05-27 ENCOUNTER — Encounter: Payer: Self-pay | Admitting: Internal Medicine

## 2010-05-27 LAB — CONVERTED CEMR LAB
ALT: 87 units/L — ABNORMAL HIGH (ref 0–35)
Alkaline Phosphatase: 109 units/L (ref 39–117)
BUN: 12 mg/dL (ref 6–23)
Basophils Relative: 0.4 % (ref 0.0–3.0)
Bilirubin, Direct: 0.1 mg/dL (ref 0.0–0.3)
CO2: 30 meq/L (ref 19–32)
Chloride: 101 meq/L (ref 96–112)
Eosinophils Relative: 5.9 % — ABNORMAL HIGH (ref 0.0–5.0)
Glucose, Bld: 125 mg/dL — ABNORMAL HIGH (ref 70–99)
HDL: 33.9 mg/dL — ABNORMAL LOW (ref >39.00)
Hgb A1c MFr Bld: 6.5 % (ref 4.6–6.5)
LDL Cholesterol: 85 mg/dL (ref 0–99)
Lymphocytes Relative: 35.2 % (ref 12.0–46.0)
MCV: 95.9 fL (ref 78.0–100.0)
Monocytes Absolute: 0.5 10*3/uL (ref 0.1–1.0)
Neutrophils Relative %: 52 % (ref 43.0–77.0)
Platelets: 274 10*3/uL (ref 150.0–400.0)
Potassium: 4.2 meq/L (ref 3.5–5.1)
RBC: 3.93 M/uL (ref 3.87–5.11)
Sodium: 140 meq/L (ref 135–145)
Total Bilirubin: 0.7 mg/dL (ref 0.3–1.2)
Total CHOL/HDL Ratio: 4
Total Protein: 7 g/dL (ref 6.0–8.3)
VLDL: 31.6 mg/dL (ref 0.0–40.0)
WBC: 7.9 10*3/uL (ref 4.5–10.5)

## 2010-05-28 ENCOUNTER — Telehealth: Payer: Self-pay | Admitting: Internal Medicine

## 2010-05-28 ENCOUNTER — Encounter: Payer: Self-pay | Admitting: Cardiovascular Disease

## 2010-05-28 ENCOUNTER — Ambulatory Visit: Payer: Self-pay | Admitting: Cardiovascular Disease

## 2010-06-10 ENCOUNTER — Telehealth (INDEPENDENT_AMBULATORY_CARE_PROVIDER_SITE_OTHER): Payer: Self-pay | Admitting: Radiology

## 2010-06-10 ENCOUNTER — Encounter: Payer: Self-pay | Admitting: Internal Medicine

## 2010-06-11 ENCOUNTER — Ambulatory Visit: Payer: Self-pay

## 2010-06-11 ENCOUNTER — Ambulatory Visit (HOSPITAL_COMMUNITY)
Admission: RE | Admit: 2010-06-11 | Discharge: 2010-06-11 | Payer: Self-pay | Source: Home / Self Care | Attending: Cardiovascular Disease | Admitting: Cardiovascular Disease

## 2010-06-11 ENCOUNTER — Encounter (HOSPITAL_COMMUNITY)
Admission: RE | Admit: 2010-06-11 | Discharge: 2010-07-23 | Payer: Self-pay | Source: Home / Self Care | Attending: Cardiology | Admitting: Cardiology

## 2010-06-11 ENCOUNTER — Encounter: Payer: Self-pay | Admitting: Cardiovascular Disease

## 2010-06-11 ENCOUNTER — Encounter: Payer: Self-pay | Admitting: Cardiology

## 2010-06-11 ENCOUNTER — Encounter: Payer: Self-pay | Admitting: *Deleted

## 2010-06-19 ENCOUNTER — Ambulatory Visit: Payer: Self-pay | Admitting: Cardiology

## 2010-06-20 ENCOUNTER — Encounter
Admission: RE | Admit: 2010-06-20 | Discharge: 2010-06-20 | Payer: Self-pay | Source: Home / Self Care | Attending: Internal Medicine | Admitting: Internal Medicine

## 2010-06-20 LAB — HM MAMMOGRAPHY

## 2010-06-25 ENCOUNTER — Encounter: Payer: Self-pay | Admitting: Internal Medicine

## 2010-06-26 ENCOUNTER — Encounter: Payer: Self-pay | Admitting: Internal Medicine

## 2010-07-01 ENCOUNTER — Telehealth: Payer: Self-pay | Admitting: Internal Medicine

## 2010-07-05 ENCOUNTER — Encounter: Payer: Self-pay | Admitting: Internal Medicine

## 2010-07-08 ENCOUNTER — Telehealth: Payer: Self-pay | Admitting: Internal Medicine

## 2010-07-08 ENCOUNTER — Encounter: Payer: Self-pay | Admitting: Internal Medicine

## 2010-07-09 ENCOUNTER — Encounter (HOSPITAL_COMMUNITY)
Admission: RE | Admit: 2010-07-09 | Discharge: 2010-07-23 | Payer: Self-pay | Source: Home / Self Care | Attending: Internal Medicine | Admitting: Internal Medicine

## 2010-07-10 ENCOUNTER — Encounter: Payer: Self-pay | Admitting: Internal Medicine

## 2010-07-13 ENCOUNTER — Encounter: Payer: Self-pay | Admitting: *Deleted

## 2010-07-14 ENCOUNTER — Encounter: Payer: Self-pay | Admitting: Psychiatry

## 2010-07-14 ENCOUNTER — Encounter: Payer: Self-pay | Admitting: Pulmonary Disease

## 2010-07-16 ENCOUNTER — Encounter: Payer: Self-pay | Admitting: Internal Medicine

## 2010-07-17 ENCOUNTER — Encounter: Payer: Self-pay | Admitting: Internal Medicine

## 2010-07-17 ENCOUNTER — Encounter
Admission: RE | Admit: 2010-07-17 | Discharge: 2010-07-23 | Payer: Self-pay | Source: Home / Self Care | Attending: Internal Medicine | Admitting: Internal Medicine

## 2010-07-18 ENCOUNTER — Telehealth: Payer: Self-pay | Admitting: Internal Medicine

## 2010-07-19 ENCOUNTER — Telehealth: Payer: Self-pay | Admitting: Internal Medicine

## 2010-07-23 NOTE — Progress Notes (Signed)
Summary: med refills  Phone Note Refill Request Message from:  Patient  Refills Requested: Medication #1:  DIFLUCAN 150 MG TABS take 1 tab once daily repeat daily as needed  Medication #2:  METROGEL-VAGINAL 0.75 % GEL apply PV at bedtime x 5 days [facility]  Initial call taken by: Orlan Leavens RMA,  January 31, 2010 1:49 PM  Follow-up for Phone Call        Per Dr. Felicity Coyer pls send refills into [facility] Follow-up by: Orlan Leavens RMA,  January 31, 2010 1:50 PM    Prescriptions: DIFLUCAN 150 MG TABS (FLUCONAZOLE) take 1 tab once daily repeat daily as needed  #3 x 0   Entered by:   Orlan Leavens RMA   Authorized by:   Newt Lukes MD   Signed by:   Orlan Leavens RMA on 01/31/2010   Method used:   Electronically to        [facility] [address] [phone]* (retail)       [address] [place], [place]  [address]       Ph: [phone]       Fax: [phone]   RxID:   [id number] METROGEL-VAGINAL 0.75 % GEL (METRONIDAZOLE) apply PV at bedtime x 5 days  #1 tube x 0   Entered by:   Orlan Leavens RMA   Authorized by:   Newt Lukes MD   Signed by:   Orlan Leavens RMA on 01/31/2010   Method used:   Electronically to        [facility] [address] [phone]* (retail)       [address] [place], [place]  [address]       Ph: [phone]       Fax: [phone]   RxID:   [id number]

## 2010-07-23 NOTE — Progress Notes (Signed)
Summary: CALL A NURSE  Phone Note Other Incoming   Summary of Call: Call-A-Nurse Triage Call Report Caswell Corwin Operator: [id number] Triage Record Num: Call Date & Time: Valen Mascaro Patient Name: 09/14/2009 7:28:12PM Rene Paci PCP: [phone] Patient Phone: PCP Fax : [phone] Patient Gender: Female Mar 11, 1956 Patient DOB: Practice Name: [facility] Reason for Call: Pt calling that she returned from [place] 09/14/09 @ 2400 and now a fever of 102.6. Now having more diarrhea , no vomiting and back hurts. Had the flu and had a Z-Pack and then had ear inf and then had vaginitis and used cream. Temp started 09/13/09. No specific sx of UTI. Triaged abd pain and fever and home care and call back inst given. Inst to call in AM 09/15/09 for appt. Abdominal Pain / Discomfort Protocol(s) Used: Fever - Adult Protocol(s) Used: See Provider within 24 hours Recommended Outcome per Protocol: Reason for Outcome: Abdominal pain is present Pain/discomfort over bladder AND urinary tract symptoms Care Advice: Call provider if urine becomes pink, red, or smoky in color.  ~ Call provider if you develop flank or low back pain, fever, chills, or generally feel sick.  ~ Increase intake of fluids. Try to drink 8 oz. (.2 liter) every hour when awake, including unsweetened cranberry juice, unless on restricted fluids for other medical reasons. Take sips of fluid or eat ice chips if nauseated or vomiting.  ~ Limit carbonated, alcoholic, and caffeinated beverages such as coffee, tea and soda. Avoid OTC cold and allergy medications that contain caffeine. Limit intake of tomatoes, fruit juices (except for unsweetened cranberry juice), dairy products, spicy foods, sugar, and artificial sweeteners (aspartame or saccharine). Stop or decrease smoking. Reducing exposure to bladder irritants may help lessen urgency.  ~ Consider acetaminophen as directed on label or by pharmacist/provider for pain or  fever.   Follow-up for Phone Call        PRECAUTIONS: - Use only If there is no history of liver disease, alcoholism, or intake of three or more alcohol drinks per day. - If approved by provider when breastfeeding. - Do not exceed recommended dose or frequency.  ~ Analgesic Advice: Consider aspirin, ibuprofen, naproxen or ketoprofen for pain or fever as directed on label or by pharmacist/provider. PRECAUTION: - If over 61 years of age, should not take longer than 1 week without consulting provider. EXCEPTIONS: - Should not be used if taking blood thinners. - Or if have history of sensitivity/allergy to any of these medications; or history of ulcer or kidney disease.  ~ Page 1 of 1 09/14/2009 7:48:11PM [id number] Follow-up by: Lamar Sprinkles, CMA,  September 17, 2009 3:03 PM  Additional Follow-up for Phone Call Additional follow up Details #1::        noted - thx Additional Follow-up by: Newt Lukes MD,  September 17, 2009 3:28 PM

## 2010-07-23 NOTE — Progress Notes (Signed)
Summary: zovirax  Phone Note Call from Patient Call back at Work Phone [phone]   Caller: Patient Reason for Call: Refill Medication, Talk to Nurse Summary of Call: Requesting refill on Zovirax. Need to go to [facility]. Is this ok? Initial call taken by: Orlan Leavens RMA,  April 23, 2010 9:08 AM  Follow-up for Phone Call        yes - ok to fill as prev rx'd - thanks Follow-up by: Newt Lukes MD,  April 23, 2010 9:15 AM  Additional Follow-up for Phone Call Additional follow up Details #1::        Notified pt rx sent to [facility].  Additional Follow-up by: Orlan Leavens RMA,  April 23, 2010 12:05 PM    Prescriptions: ZOVIRAX 400 MG TABS (ACYCLOVIR) take 1 three times a day for 5 days  #30 x 1   Entered by:   Orlan Leavens RMA   Authorized by:   Newt Lukes MD   Signed by:   Orlan Leavens RMA on 04/23/2010   Method used:   Electronically to        [facility]* (retail)       [address]       [address] Shipping/mailing       [place], [place]  [address]       Ph: [phone]       Fax: [phone]   RxID:   [id number]

## 2010-07-23 NOTE — Assessment & Plan Note (Signed)
Summary: ORTHOTICS,MC   Vital Signs:  Patient profile:   55 year old female BP sitting:   140 / 79  Vitals Entered By: Lillia Pauls CMA (Nov 20, 2009 2:22 PM)  History of Present Illness: Patient seen in f/u with B lateral foot pain - fitted with thin orthotic and base. Feet are feeling much better, but many pairs of shoes cannot fit her with the thin orthotic. Here to recheck and for orthotic production for dress shoes  quite a bit better  for first time she had a gout attach in RT foot recently -- none recently and feet feeling much better   ROS: o/w has MS, other medical problems, generally doing OK right now. Recent hosp noted.  PE:  WDWN, NAD  slight tremor   bilateral pronation with RT > left on standing walking gait confirms that she pronates and more so on RT sitting she actually has a high arch shape more suggestive of cavus foot but this collapses partially with standing sinus tarsi swollen on both feet  on RT ant third of PF there is a nodule that is tender and thickened suggestive of partial tear with retraction, less tender    Allergies: No Known Drug Allergies  Past History:  Past medical, surgical, family and social histories (including risk factors) reviewed, and no changes noted (except as noted below).  Past Medical History: Reviewed history from 07/23/2009 and no changes required. Hypertension Diabetes mellitus type 2, diet controlled Multiple Sclerosis Anxiety Pulmonary Embolus/DVT - postsurg - 6/08 Hypothyroidism GERD  physician rooster - GI-[facility] neuro - Jeffries cards - [facility]  Past Surgical History: Reviewed history from 02/16/2009 and no changes required. Laparoscopic cecal polyp resection 6/08 (Right hemicolectomy) Ex lap and ileostomy/Hartmann's pouch for anastamotic leak 6/08 Ileostomy reversal 11/08 Cholecystectomy Cesarean section x 2 Abdominoplasty Total thyroidectomy - 1990  Family History: Reviewed history from  02/16/2009 and no changes required. mom - A&W - hx breast cancer dad - died - stomach cancer age 74 both GMs - DM one sis - A&W  Social History: Reviewed history from 02/07/2009 and no changes required. Full Time Married  Patient has never smoked.  Alcohol Use - no Daily Caffeine Use  Physical Exam  Ears:  no external deformities.   Nose:  no external deformity.   Lungs:  normal respiratory effort, no intercostal retractions, and no accessory muscle use.   Psych:  Cognition and judgment appear intact. Alert and cooperative with normal attention span and concentration. No apparent delusions, illusions, hallucinations   Impression & Recommendations:  Problem # 1:  PLANTAR FASCIITIS, RIGHT (ICD-728.71)  Patient was fitted for a standard, cushioned, semi-rigid orthotic - thin fast tech dress orthotic.  The orthotic was heated and the patient stood on the orthotic blank positioned on the orthotic stand. The patient was positioned in subtalar neutral position and 10 degrees of ankle dorsiflexion in a weight bearing stance. After molding, a blue poron base was applied to the orthotic blank.   The blank was ground to a stable position for weight bearing. size:8 w base: blue poron posting: none additional orthotic padding: none   >30 minutes spent in face to face time with patient, prep time, discussion. feet are feeling better. base of prior orthotic also ground down some  Orders: [employer], each unit [phone])  Problem # 2:  FOOT PAIN, BILATERAL (ICD-729.5)  Orders: [employer], each unit (U0454)  Problem # 3:  SINUS TARSI SYNDROME (ICD-726.79)  Orders: [employer], each unit (  Z6109)  Problem # 4:  ACUTE GOUTY ARTHROPATHY (ICD-274.01)  Her updated medication list for this problem includes:    Allopurinol 100 Mg Tabs (Allopurinol) .Marland Kitchen... 1 by mouth once daily  Orders: [employer], each unit [phone])  Problem # 5:  DIABETES MELLITUS, TYPE II  (ICD-250.00)  Her updated medication list for this problem includes:    Benicar Hct 20-12.5 Mg Tabs (Olmesartan medoxomil-hctz) .Marland Kitchen... 1 by mouth once daily  Orders: [employer], each unit [phone])  Problem # 6:  MULTIPLE SCLEROSIS (ICD-340)  Orders: [employer], each unit (B1478)  Complete Medication List: 1)  Synthroid 100 Mcg Tabs (Levothyroxine sodium) .... Take one by mouth once daily 2)  Inderal La 120 Mg Xr24h-cap (Propranolol hcl) .Marland Kitchen.. 1 cap once daily 3)  Prilosec 20 Mg Cpdr (Omeprazole) .... Take one tab by mouth once daily 4)  Benicar Hct 20-12.5 Mg Tabs (Olmesartan medoxomil-hctz) .Marland Kitchen.. 1 by mouth once daily 5)  Provigil 200 Mg Tabs (Modafinil) .... Take one tab by mouth two times a day as needed 6)  Imodium A-d 2 Mg Tabs (Loperamide hcl) .... 6-8 tablets daily prn 7)  Copaxone 20 Mg/ml Kit (Glatiramer acetate) .... Daily injection 8)  Multivitamins Tabs (Multiple vitamin) .Marland Kitchen.. 1 by mouth once daily 9)  Cvs Calcium-600 600 Mg Tabs (Calcium carbonate) .Marland Kitchen.. 1 by mouth qd 10)  Lomotil 2.5-0.025 Mg Tabs (Diphenoxylate-atropine) .Marland Kitchen.. 1-2 by mouth q 6 hours as needed diarrhea 11)  Lovaza 1 Gm Caps (Omega-3-acid ethyl esters) .... Take 2 tablets two times a day 12)  Lipitor 10 Mg Tabs (Atorvastatin calcium) .... Take 1 by mouth once daily 13)  Freestyle Lite Test Strp (Glucose blood) .... Check blood sugar two times a day 14)  Freestyle Lancets Misc (Lancets) .... Use as directed 15)  Furosemide 40 Mg Tabs (Furosemide) .... Take 1 once daily prn 16)  Zovirax 400 Mg Tabs (Acyclovir) .... Take 1 three times a day for 5 days 17)  Allopurinol 100 Mg Tabs (Allopurinol) .Marland Kitchen.. 1 by mouth once daily 18)  Metrogel-vaginal 0.75 % Gel (Metronidazole) .... Apply pv at bedtime x 5 days 19)  Diflucan 150 Mg Tabs (Fluconazole) .... Take 1 tab once daily repeat daily as needed 20)  Clonazepam 0.5 Mg Tabs (Clonazepam) .... Take 1 three times a day as needed

## 2010-07-23 NOTE — Assessment & Plan Note (Signed)
Summary: chest pain/cd   Vital Signs:  Patient profile:   55 year old female O2 Sat:      96 % on Room air Pulse rate:   74 / minute BP sitting:   120 / 82  (left arm) Cuff size:   large  Vitals Entered By: Orlan Leavens RMA (May 27, 2010 8:58 AM)  O2 Flow:  Room air CC: chest pain Is Patient Diabetic? Yes Did you bring your meter with you today? No Pain Assessment Patient in pain? no      Comments Pt states las night was having some chest pain. Radiating down into rib cage. SOB, Tingley in (L) arm.    Primary Care Vadie Principato:  Newt Lukes MD  CC:  chest pain.  History of Present Illness:       This is a 55 year old female who presents with Chest pain.  The symptoms began 12-24 hrs ago.  On a scale of 1 to 10, the intensity is described as a 6.  no pain present at this time but severe last PM. >13yr hx same symptoms intermittent, prev attributed to stress, but not stressed at this time. no known CAD, last stress test >63months.  The patient reports resting chest pain and palpitations, but denies exertional chest pain, nausea, vomiting, diaphoresis, shortness of breath, dizziness, light headedness, syncope, and indigestion.  The pain is described as intermittent, pressure-like, and band-like "tingling and fluttering".  The pain is located in the left anterior chest, right anterior chest, and neck.  The pain radiates to the left arm, right arm, and jaw.  Episodes of chest pain last 10-20 minutes.  The pain is brought on or made worse by upper body movement and, previously, caused by emotional stress.  The pain is relieved or improved with time and NSAIDs.    also reviewed chronic med issues DM2 - on amyaryl - no hypoglycemic symptoms/events, AM fasting cbgs <120s -   MS - follows with dr. Lin Givens in [place] - recent exac in symptoms summer 2011 - on tysabri infusions since 01/2010 - currently on medical leave for same per neuro  dyslipidemia - reports compliance with ongoing  medical treatment and no changes in medication dose or frequency. denies adverse side effects related to current therapy.   hypothyroid - no recent changes in dosing - no skin changes, +weight changes - lost 20lbs/last 8mos unintentional  HTN - reports compliance with ongoing medical treatment and no changes in medication dose or frequency. denies adverse side effects related to current therapy.   Clinical Review Panels:  Lipid Management   Cholesterol:  149 (04/03/2010)   LDL (bad choesterol):  72 (04/03/2010)   HDL (good cholesterol):  37 (04/03/2010)   Triglycerides:  199 (04/03/2010)  Diabetes Management   HgBA1C:  6.1 (02/16/2009)   Creatinine:  0.8 (04/03/2010)   Last Flu Vaccine:  Historical given @ [facility] (03/23/2010)  CBC   WBC:  9.0 (04/03/2010)   RBC:  4.1 (04/03/2010)   Hgb:  12.6 (04/03/2010)   Hct:  38.9 (04/03/2010)   Platelets:  311 (04/03/2010)   MCV  94.4 (04/03/2010)   MCHC  33.4 (02/07/2009)   RDW  16.2 (04/03/2010)   PMN:  52.0 (04/03/2010)   Lymphs:  20.9 (02/07/2009)   Monos:  8.0 (04/03/2010)   Eosinophils:  3.0 (04/03/2010)   Basophil:  1.0 (04/03/2010)  Complete Metabolic Panel   Glucose:  94 (04/03/2010)   Sodium:  142 (04/03/2010)   Potassium:  4.0 (04/03/2010)   Chloride:  101 (04/03/2010)   CO2:  28 (04/03/2010)   BUN:  11 (04/03/2010)   Creatinine:  0.8 (04/03/2010)   Albumin:  4.7 (04/03/2010)   Total Protein:  7.4 (04/03/2010)   Calcium:  10.0 (04/03/2010)   Total Bili:  0.7 (04/03/2010)   Alk Phos:  122 (04/03/2010)   SGPT (ALT):  89.0 (04/03/2010)   SGOT (AST):  75.0 (04/03/2010)   Current Medications (verified): 1)  Synthroid 100 Mcg  Tabs (Levothyroxine Sodium) .... Take One By Mouth Once Daily 2)  Inderal La 120 Mg Xr24h-Cap (Propranolol Hcl) .Marland Kitchen.. 1 Cap Once Daily 3)  Prilosec 20 Mg  Cpdr (Omeprazole) .... Take One Tab By Mouth Once Daily 4)  Benicar Hct 20-12.5 Mg Tabs (Olmesartan Medoxomil-Hctz) .Marland Kitchen.. 1 By Mouth Once Daily 5)   Provigil 200 Mg  Tabs (Modafinil) .... Take One Tab By Mouth Two Times A Day As Needed 6)  Multivitamins  Tabs (Multiple Vitamin) .Marland Kitchen.. 1 By Mouth Once Daily 7)  Cvs Calcium-600 600 Mg Tabs (Calcium Carbonate) .Marland Kitchen.. 1 By Mouth Qd 8)  Lomotil 2.5-0.025 Mg  Tabs (Diphenoxylate-Atropine) .Marland Kitchen.. 1-2 By Mouth Q 6 Hours As Needed Diarrhea 9)  Lovaza 1 Gm Caps (Omega-3-Acid Ethyl Esters) .... Take 2 Tablets Two Times A Day 10)  Lipitor 10 Mg Tabs (Atorvastatin Calcium) .... Take 1 By Mouth Once Daily 11)  Freestyle Lite Test  Strp (Glucose Blood) .... Check Blood Sugar Two Times A Day 12)  Freestyle Lancets  Misc (Lancets) .... Use As Directed 13)  Furosemide 40 Mg Tabs (Furosemide) .... Take 1 Once Daily 14)  Zovirax 400 Mg Tabs (Acyclovir) .... Take 1 Three Times A Day For 5 Days As Needed 15)  Allopurinol 100 Mg Tabs (Allopurinol) .Marland Kitchen.. 1 By Mouth Once Daily 16)  Clonazepam 0.5 Mg Tabs (Clonazepam) .... Take 1 Three Times A Day As Needed 17)  Glimepiride 1 Mg Tabs (Glimepiride) .Marland Kitchen.. 1 By Mouth Every Morning 18)  Tysabri 300 Mg/22ml Conc (Natalizumab) .... Infusion Every 30days  Allergies (verified): No Known Drug Allergies  Past History:  Past Medical History: Hypertension Diabetes mellitus type 2 Multiple Sclerosis  Anxiety Pulmonary Embolus/DVT - postsurg - 11/2006 Hypothyroidism GERD  physician roster - GI - Leone Payor neuro - Jeffries cards - [facility] gyn - tavvon  Review of Systems  The patient denies fever, peripheral edema, headaches, and severe indigestion/heartburn.    Physical Exam  General:  alert, well-developed, well-nourished, and cooperative to examination.    Chest Wall:  No deformities, masses, or tenderness noted. Lungs:  normal respiratory effort, no intercostal retractions or use of accessory muscles; normal breath sounds bilaterally - no crackles and no wheezes.    Heart:  normal rate, regular rhythm, no murmur, and no rub. BLE without edema.    Impression &  Recommendations:  Problem # 1:  CHEST PAIN (ICD-786.50) long intermitt hx same, prev attributed to stress nonexertional and bilateral symptoms - none at this time  - refer for stress test -  to be seen by cards prior to ensure no cardiac cath needed given CRF: age and DM, HTN, chol also CXR - daily asa rec until further cardiac eval complete Orders: EKG w/ Interpretation (93000) T-2 View CXR (71020TC) Cardiology Referral (Cardiology)  Complete Medication List: 1)  Synthroid 100 Mcg Tabs (Levothyroxine sodium) .... Take one by mouth once daily 2)  Inderal La 120 Mg Xr24h-cap (Propranolol hcl) .Marland Kitchen.. 1 cap once daily 3)  Prilosec 20 Mg Cpdr (Omeprazole) .Marland KitchenMarland KitchenMarland Kitchen  Take one tab by mouth once daily 4)  Benicar Hct 20-12.5 Mg Tabs (Olmesartan medoxomil-hctz) .Marland Kitchen.. 1 by mouth once daily 5)  Provigil 200 Mg Tabs (Modafinil) .... Take one tab by mouth two times a day as needed 6)  Multivitamins Tabs (Multiple vitamin) .Marland Kitchen.. 1 by mouth once daily 7)  Cvs Calcium-600 600 Mg Tabs (Calcium carbonate) .Marland Kitchen.. 1 by mouth qd 8)  Lomotil 2.5-0.025 Mg Tabs (Diphenoxylate-atropine) .Marland Kitchen.. 1-2 by mouth q 6 hours as needed diarrhea 9)  Lovaza 1 Gm Caps (Omega-3-acid ethyl esters) .... Take 2 tablets two times a day 10)  Lipitor 10 Mg Tabs (Atorvastatin calcium) .... Take 1 by mouth once daily 11)  Freestyle Lite Test Strp (Glucose blood) .... Check blood sugar two times a day 12)  Freestyle Lancets Misc (Lancets) .... Use as directed 13)  Furosemide 40 Mg Tabs (Furosemide) .... Take 1 once daily 14)  Zovirax 400 Mg Tabs (Acyclovir) .... Take 1 three times a day for 5 days as needed 15)  Allopurinol 100 Mg Tabs (Allopurinol) .Marland Kitchen.. 1 by mouth once daily 16)  Clonazepam 0.5 Mg Tabs (Clonazepam) .... Take 1 three times a day as needed 17)  Glimepiride 1 Mg Tabs (Glimepiride) .Marland Kitchen.. 1 by mouth every morning 18)  Tysabri 300 Mg/49ml Conc (Natalizumab) .... Infusion every 30days 19)  Aspirin 81 Mg Tbec (Aspirin) .Marland Kitchen.. 1 by mouth  once daily  Patient Instructions: 1)  your ekg and exam look ok today - 2)  continue aspirin daily until fiurther cardiac evaluation complete 3)  we'll make referral to cardiology for definitive stress testing to exclude heart discease as cause of your recurrent sypmtoms  . Our office will contact you regarding this appointment once made.  4)  test(s) ordered today - your results will be called to you after review 5)  Please schedule a follow-up appointment as needed.   Orders Added: 1)  EKG w/ Interpretation [93000] 2)  T-2 View CXR [71020TC] 3)  Est. Patient Level IV [33007] 4)  Cardiology Referral [Cardiology]

## 2010-07-23 NOTE — Progress Notes (Signed)
Summary: Follow-up re: diarrheal illness  Medications Added CIPRO 500 MG TABS (CIPROFLOXACIN HCL) 1 by mouth bid METRONIDAZOLE 500 MG TABS (METRONIDAZOLE) 1 by mouth every 8 hrs       Phone Note Outgoing Call   Call placed by: [name] MD, [name],  September 17, 2009 5:41 PM Summary of Call: left message that I was aware she was ill with diarrhearecently and trying to follow-up with her [name] MD, [facility]  September 17, 2009 5:41 PM   Follow-up for Phone Call        she was hospitalized with fever and colitis (on CT) that developed after a trip to [place]. May have been somewhat ill before the trip. home from hospitalx 3 days and now back to work she has not had further fever c diff neg x 2, o&P neg, blood x neg she is better with less diarrhea (on her usual Lomotil)  but is having nausea and AM vomiting Plan is to hol metrondaziole and stay on cipro. she will f/u me tomorrow. Follow-up by: [name] MD, [name],  September 19, 2009 2:29 PM    New/Updated Medications: CIPRO 500 MG TABS (CIPROFLOXACIN HCL) 1 by mouth bid METRONIDAZOLE 500 MG TABS (METRONIDAZOLE) 1 by mouth every 8 hrs  Appended Document: Follow-up re: diarrheal illness she has had Augmentin and a Z-pack within past couple of months so C. diff not entirely excluded she may need empiric vancomycin or a flex sig depending upon clinical course

## 2010-07-23 NOTE — Op Note (Signed)
Summary: [name]    [name]    Imported By: Lennie Odor 05/13/2010 14:23:31  _____________________________________________________________________  External Attachment:    Type:   Image     Comment:   External Document

## 2010-07-23 NOTE — Progress Notes (Signed)
Summary: clonazepam & Provigil  Phone Note Call from Patient   Caller: Patient Reason for Call: Refill Medication Summary of Call: Pt is requesting rx for Clonazepam & Provigil 200mg  take 1 two times a day to be sent to [facility] 90 day supply is this ok? Initial call taken by: Orlan Leavens,  October 09, 2009 8:30 AM  Follow-up for Phone Call        ok to fill once dose and freq of clonazepam known - thanks Follow-up by: Newt Lukes MD,  October 09, 2009 9:21 AM  Additional Follow-up for Phone Call Additional follow up Details #1::        Contacted [facility] to get dosage of clonazepam. was taking 0.5 three times a day. last filled 04/20/09. Sending new rx to [facility] per md  Additional Follow-up by: Orlan Leavens,  October 09, 2009 9:30 AM    Additional Follow-up for Phone Call Additional follow up Details #2::    Notified [facility] spoke with crystal/pharmacist gave verbal auth for clonazepam & provigil refills. updated EMR. Pt is aware. Follow-up by: Orlan Leavens,  October 09, 2009 10:12 AM  New/Updated Medications: PROVIGIL 200 MG  TABS (MODAFINIL) take one tab by mouth two times a day as needed CLONAZEPAM 0.5 MG TABS (CLONAZEPAM) take 1 three times a day as needed Prescriptions: CLONAZEPAM 0.5 MG TABS (CLONAZEPAM) take 1 three times a day as needed  #90 x 1   Entered by:   Orlan Leavens   Authorized by:   Newt Lukes MD   Signed by:   Orlan Leavens on 10/09/2009   Method used:   Telephoned to ...       [facility]* (retail)       [address]       [address] Shipping/mailing       [place], [place]  [address]       Ph: [phone]       Fax: [phone]   RxID:   [id number] PROVIGIL 200 MG  TABS (MODAFINIL) take one tab by mouth two times a day as needed  #60 x 1   Entered by:   Orlan Leavens   Authorized by:   Newt Lukes MD   Signed by:   Orlan Leavens on 10/09/2009   Method used:   Telephoned to ...       [facility]* (retail)       [address]       [address] Shipping/mailing       [place], [place]  [address]       Ph: [phone]       Fax: [phone]   RxID:   [id number]

## 2010-07-23 NOTE — Assessment & Plan Note (Signed)
Summary: np3/chest pain/lg      Allergies Added: NKDA  Primary Provider:  Newt Lukes MD  CC:  referal from Dr. Leonides Schanz...pt states she has chest pains that comes and goes.  History of Present Illness: [name] is Catering manager of primary care.  She is a patient of Dr Daleen Squibb.  Seen by Dr Bayard Hugger and concerns about recurrent SSCP, palpitations and dyspnea.  Has had worsening MS since August.  Has significant facial tremor and difficulty with balance.  Pains are atypical, sharp, over both arms and shoulders and neck and chest  Not always exertional.  Has dyspnea with ambulation.  No cough fever, sputum or edema.  Compliant with meds.  Normal stress test and echo in 2009.    Current Problems (verified): 1)  Chest Pain  (ICD-786.50) 2)  Preventive Health Care  (ICD-V70.0) 3)  Otitis Media, Left  (ICD-382.9) 4)  Abnormality of Gait  (ICD-781.2) 5)  Sinus Tarsi Syndrome  (ICD-726.79) 6)  Menopausal Syndrome  (ICD-627.2) 7)  Foot Pain, Bilateral  (ICD-729.5) 8)  Ankle Pain, Left  (ICD-719.47) 9)  Plantar Fasciitis, Right  (ICD-728.71) 10)  Acute Gouty Arthropathy  (ICD-274.01) 11)  Hypertension  (ICD-401.9) 12)  Hypothyroidism  (ICD-244.9) 13)  Diabetes Mellitus, Type II  (ICD-250.00) 14)  Hyperlipidemia  (ICD-272.4) 15)  Multiple Sclerosis  (ICD-340) 16)  Diarrhea, Chronic  (ICD-787.91) 17)  Nonspecific Abnormal Finding in Stool Contents  (ICD-792.1) 18)  Change in Bowels  (ICD-787.99) 19)  Tubulovillous Adenoma, Colon, Hx of  (ICD-V12.72) 20)  Encounter For Long-term Use of Other Medications  (ICD-V58.69) 21)  Dyspnea  (ICD-786.05) 22)  Pulmonary Embolism  (ICD-415.19) 23)  Deep Venous Thrombophlebitis  (ICD-453.40) 24)  ? of Ibs  (ICD-564.1) 25)  ? of Panic Disorder  (ICD-300.01) 26)  Anxiety Disorder  (ICD-300.00) 27)  Gerd  (ICD-530.81)  Current Medications (verified): 1)  Synthroid 100 Mcg  Tabs (Levothyroxine Sodium) .... Take One By Mouth Once Daily 2)  Inderal La 120 Mg  Xr24h-Cap (Propranolol Hcl) .Marland Kitchen.. 1 Cap Once Daily 3)  Prilosec 20 Mg  Cpdr (Omeprazole) .... Take One Tab By Mouth Once Daily 4)  Benicar Hct 20-12.5 Mg Tabs (Olmesartan Medoxomil-Hctz) .Marland Kitchen.. 1 By Mouth Once Daily 5)  Provigil 200 Mg  Tabs (Modafinil) .... Take One Tab By Mouth Two Times A Day As Needed 6)  Multivitamins  Tabs (Multiple Vitamin) .Marland Kitchen.. 1 By Mouth Once Daily 7)  [facility] Calcium-600 600 Mg Tabs (Calcium Carbonate) .Marland Kitchen.. 1 By Mouth Qd 8)  Lomotil 2.5-0.025 Mg  Tabs (Diphenoxylate-Atropine) .Marland Kitchen.. 1-2 By Mouth Q 6 Hours As Needed Diarrhea 9)  Lovaza 1 Gm Caps (Omega-3-Acid Ethyl Esters) .... Take 2 Tablets Two Times A Day 10)  Lipitor 10 Mg Tabs (Atorvastatin Calcium) .... Take 1 By Mouth Once Daily 11)  Freestyle Lite Test  Strp (Glucose Blood) .... Check Blood Sugar Two Times A Day 12)  Freestyle Lancets  Misc (Lancets) .... Use As Directed 13)  Furosemide 40 Mg Tabs (Furosemide) .... Take 1 Once Daily 14)  Zovirax 400 Mg Tabs (Acyclovir) .... Take 1 Three Times A Day For 5 Days As Needed 15)  Allopurinol 100 Mg Tabs (Allopurinol) .Marland Kitchen.. 1 By Mouth Once Daily 16)  Clonazepam 0.5 Mg Tabs (Clonazepam) .... Take 1 Three Times A Day As Needed 17)  Glimepiride 1 Mg Tabs (Glimepiride) .Marland Kitchen.. 1 By Mouth Every Morning 18)  Tysabri 300 Mg/22ml Conc (Natalizumab) .... Infusion Every 30days 19)  Aspirin 81 Mg Tbec (Aspirin) .Marland KitchenMarland KitchenMarland Kitchen 1  By Mouth Once Daily  Allergies (verified): No Known Drug Allergies  Past History:  Past Medical History: Last updated: 05/27/2010 Hypertension Diabetes mellitus type 2 Multiple Sclerosis  Anxiety Pulmonary Embolus/DVT - postsurg - 11/2006 Hypothyroidism GERD  physician roster - GI - [facility] Payor neuro - Jeffries cards - [facility] gyn - tavvon  Past Surgical History: Last updated: 02/16/2009 Laparoscopic cecal polyp resection 6/08 (Right hemicolectomy) Ex lap and ileostomy/Hartmann's pouch for anastamotic leak 6/08 Ileostomy reversal 11/08 Cholecystectomy Cesarean  section x 2 Abdominoplasty Total thyroidectomy - 1990  Family History: Last updated: 02/16/2009 mom - A&W - hx breast cancer dad - died - stomach cancer age 64 both GMs - DM one sis - A&W  Social History: Last updated: 05/21/2010 on medical leave since 04/2010 Married - lives with spouse Patient has never smoked.  Alcohol Use - no Daily Caffeine Use  Review of Systems       Denies fever, malais, weight loss, blurry vision, decreased visual acuity, cough, sputum, hemoptysis, pleuritic pain, palpitaitons, heartburn, abdominal pain, melena, lower extremity edema, claudication, or rash.   Vital Signs:  Patient profile:   55 year old female Height:      65 inches Weight:      224 pounds BMI:     37.41 Pulse rate:   64 / minute Resp:     14 per minute BP sitting:   120 / 73  (left arm)  Vitals Entered By: Kem Parkinson (May 28, 2010 4:07 PM)  Physical Exam  General:  Affect appropriate Healthy:  appears stated age HEENT: normal Neck supple with no adenopathy JVP normal no bruits no thyromegaly Lungs clear with no wheezing and good diaphragmatic motion Heart:  S1/S2 no murmur,rub, gallop or click PMI normal Abdomen: benighn, BS positve, no tenderness, no AAA no bruit.  No HSM or HJR Distal pulses intact with no bruits No edema Neuro non-focal Skin warm and dry Facial tremor   Impression & Recommendations:  Problem # 1:  CHEST PAIN (ICD-786.50) Atypical  F/u stress myovue.  Continue ASA and BB Her updated medication list for this problem includes:    Inderal La 120 Mg Xr24h-cap (Propranolol hcl) .Marland Kitchen... 1 cap once daily    Aspirin 81 Mg Tbec (Aspirin) .Marland Kitchen... 1 by mouth once daily  Orders: Nuclear Stress Test (Nuc Stress Test)  Problem # 2:  ABNORMALITY OF GAIT (ICD-781.2) Pains may be a residual effect of MS.  Continue monthy iv infusion and F/U with neurology.    Problem # 3:  HYPERTENSION (ICD-401.9) Well controlled Her updated medication list for  this problem includes:    Inderal La 120 Mg Xr24h-cap (Propranolol hcl) .Marland Kitchen... 1 cap once daily    Benicar Hct 20-12.5 Mg Tabs (Olmesartan medoxomil-hctz) .Marland Kitchen... 1 by mouth once daily    Furosemide 40 Mg Tabs (Furosemide) .Marland Kitchen... Take 1 once daily    Aspirin 81 Mg Tbec (Aspirin) .Marland Kitchen... 1 by mouth once daily  Problem # 4:  HYPERLIPIDEMIA (ICD-272.4) At goal with no side effects Her updated medication list for this problem includes:    Lovaza 1 Gm Caps (Omega-3-acid ethyl esters) .Marland Kitchen... Take 2 tablets two times a day    Lipitor 10 Mg Tabs (Atorvastatin calcium) .Marland Kitchen... Take 1 by mouth once daily  CHOL: 150 (05/21/2010)   LDL: 85 (05/21/2010)   HDL: 33.90 (05/21/2010)   TG: 158.0 (05/21/2010)  Other Orders: Echocardiogram (Echo)  Patient Instructions: 1)  Your physician recommends that you schedule a follow-up appointment in: 2-3 WEEKS  WITH DR WALL 2)  Your physician has requested that you have an echocardiogram.  Echocardiography is a painless test that uses sound waves to create images of your heart. It provides your doctor with information about the size and shape of your heart and how well your heart's chambers and valves are working.  This procedure takes approximately one hour. There are no restrictions for this procedure. 3)  Your physician has requested that you have an exercise stress myoview.  For further information please visit [email or web address]  Please follow instruction sheet, as given.

## 2010-07-23 NOTE — Progress Notes (Signed)
Summary: sick at home, fever  Phone Note Call from Patient Call back at Home Phone [phone]   Caller: Patient Call For: Newt Lukes MD Reason for Call: Acute Illness Complaint: Cough/Sore throat Details of Complaint: fever, green sputum, chest and head congestion Details of Action Taken: Zpack to pt pharm, call to be seen if worse    New/Updated Medications: AZITHROMYCIN 250 MG TABS (AZITHROMYCIN) 2 tabs by mouth today, then 1 by mouth daily starting tomorrow Prescriptions: AZITHROMYCIN 250 MG TABS (AZITHROMYCIN) 2 tabs by mouth today, then 1 by mouth daily starting tomorrow  #6 x 0   Entered and Authorized by:   Newt Lukes MD   Signed by:   Newt Lukes MD on 07/10/2009   Method used:   Electronically to        [facility] [address] [phone]* (retail)       [address] [place], [place]  [address]       Ph: [phone]       Fax: [phone]   RxID:   [id number]

## 2010-07-23 NOTE — Medication Information (Signed)
Summary: [facility]  [facility]   Imported By: Sherian Rein 12/28/2009 13:53:16  _____________________________________________________________________  External Attachment:    Type:   Image     Comment:   External Document

## 2010-07-23 NOTE — Op Note (Signed)
Summary: [facility]  [facility]   Imported By: Sherian Rein 03/18/2010 07:13:52  _____________________________________________________________________  External Attachment:    Type:   Image     Comment:   External Document

## 2010-07-23 NOTE — Consult Note (Signed)
Summary: [facility]  [facility]   Imported By: Sherian Rein 07/31/2009 10:12:25  _____________________________________________________________________  External Attachment:    Type:   Image     Comment:   External Document

## 2010-07-23 NOTE — Assessment & Plan Note (Signed)
Summary: CPX/ RS'D FROM LAST WEEK/[facility]   Vital Signs:  Patient profile:   55 year old female Height:      65 inches (165.10 cm) Weight:      224.8 pounds (102.18 kg) BMI:     37.54 O2 Sat:      97 % on Room air Temp:     98.4 degrees F (36.89 degrees C) oral Pulse rate:   61 / minute BP sitting:   114 / 80  (left arm) Cuff size:   large  Vitals Entered By: Orlan Leavens RMA (May 21, 2010 11:39 AM)  O2 Flow:  Room air CC: CPX Is Patient Diabetic? Yes Did you bring your meter with you today? No Pain Assessment Patient in pain? no        Primary Care Provider:  Newt Lukes MD  CC:  CPX.  History of Present Illness: patient is here today for annual physical. Patient feels well and has no complaints.  not fasting but can return for labs - reviewed screening labs from recent employment labwork  also reviewed chronic med issues DM2 - on amyaryl - no hypoglycemic symptoms/events, AM fasting cbgs <120s -   MS - follows with dr. Lin Givens in [place] - recent exac in symptoms summer 2011 - on tysabri infusions since 01/2010 - currently on medical leave for same per neuro  dyslipidemia - reports compliance with ongoing medical treatment and no changes in medication dose or frequency. denies adverse side effects related to current therapy.   hypothyroid - no recent changes in dosing - no skin changes, +weight changes - lost 20lbs/last 8mos unintentional  HTN - reports compliance with ongoing medical treatment and no changes in medication dose or frequency. denies adverse side effects related to current therapy.   Preventive Screening-Counseling & Management  Alcohol-Tobacco     Alcohol drinks/day: 0     Alcohol Counseling: not indicated; patient does not drink     Smoking Status: never     Tobacco Counseling: not indicated; no tobacco use  Caffeine-Diet-Exercise     Does Patient Exercise: yes     Exercise Counseling: to improve exercise regimen     Depression Counseling:  not indicated; screening negative for depression  Safety-Violence-Falls     Seat Belt Counseling: not indicated; patient wears seat belts     Firearm Counseling: not applicable     Violence Counseling: not indicated; no violence risk noted  Clinical Review Panels:  Prevention   Last Mammogram:  BI-RADS CATEGORY 3:  Probably benign finding(s) - short interval^MM DIGITAL DIAGNOSTIC UNILAT L (11/05/2009)   Last Pap Smear:  Interpretation /Result:Negative for intraepithelial Lesion or Malignancy.    (05/13/2010)   Last Colonoscopy:  Location:  [facility].  (11/16/2008)  Immunizations   Last Tetanus Booster:  Historical (06/23/2005)   Last Flu Vaccine:  Historical given @ wrk (03/23/2010)  Lipid Management   Cholesterol:  149 (04/03/2010)   LDL (bad choesterol):  72 (04/03/2010)   HDL (good cholesterol):  37 (04/03/2010)   Triglycerides:  199 (04/03/2010)  CBC   WBC:  9.0 (04/03/2010)   RBC:  4.1 (04/03/2010)   Hgb:  12.6 (04/03/2010)   Hct:  38.9 (04/03/2010)   Platelets:  311 (04/03/2010)   MCV  94.4 (04/03/2010)   MCHC  33.4 (02/07/2009)   RDW  16.2 (04/03/2010)   PMN:  52.0 (04/03/2010)   Lymphs:  20.9 (02/07/2009)   Monos:  8.0 (04/03/2010)   Eosinophils:  3.0 (04/03/2010)  Basophil:  1.0 (04/03/2010)  Complete Metabolic Panel   Glucose:  94 (04/03/2010)   Sodium:  142 (04/03/2010)   Potassium:  4.0 (04/03/2010)   Chloride:  101 (04/03/2010)   CO2:  28 (04/03/2010)   BUN:  11 (04/03/2010)   Creatinine:  0.8 (04/03/2010)   Albumin:  4.7 (04/03/2010)   Total Protein:  7.4 (04/03/2010)   Calcium:  10.0 (04/03/2010)   Total Bili:  0.7 (04/03/2010)   Alk Phos:  122 (04/03/2010)   SGPT (ALT):  89.0 (04/03/2010)   SGOT (AST):  75.0 (04/03/2010)   Current Medications (verified): 1)  Synthroid 100 Mcg  Tabs (Levothyroxine Sodium) .... Take One By Mouth Once Daily 2)  Inderal La 120 Mg Xr24h-Cap (Propranolol Hcl) .Marland Kitchen.. 1 Cap Once Daily 3)  Prilosec 20 Mg   Cpdr (Omeprazole) .... Take One Tab By Mouth Once Daily 4)  Benicar Hct 20-12.5 Mg Tabs (Olmesartan Medoxomil-Hctz) .Marland Kitchen.. 1 By Mouth Once Daily 5)  Provigil 200 Mg  Tabs (Modafinil) .... Take One Tab By Mouth Two Times A Day As Needed 6)  Imodium A-D 2 Mg Tabs (Loperamide Hcl) .... 6-8 Tablets Daily Prn 7)  Copaxone 20 Mg/ml Kit (Glatiramer Acetate) .... Daily Injection 8)  Multivitamins  Tabs (Multiple Vitamin) .Marland Kitchen.. 1 By Mouth Once Daily 9)  Cvs Calcium-600 600 Mg Tabs (Calcium Carbonate) .Marland Kitchen.. 1 By Mouth Qd 10)  Lomotil 2.5-0.025 Mg  Tabs (Diphenoxylate-Atropine) .Marland Kitchen.. 1-2 By Mouth Q 6 Hours As Needed Diarrhea 11)  Lovaza 1 Gm Caps (Omega-3-Acid Ethyl Esters) .... Take 2 Tablets Two Times A Day 12)  Lipitor 10 Mg Tabs (Atorvastatin Calcium) .... Take 1 By Mouth Once Daily 13)  Freestyle Lite Test  Strp (Glucose Blood) .... Check Blood Sugar Two Times A Day 14)  Freestyle Lancets  Misc (Lancets) .... Use As Directed 15)  Furosemide 40 Mg Tabs (Furosemide) .... Take 1 Once Daily Prn 16)  Zovirax 400 Mg Tabs (Acyclovir) .... Take 1 Three Times A Day For 5 Days As Needed 17)  Allopurinol 100 Mg Tabs (Allopurinol) .Marland Kitchen.. 1 By Mouth Once Daily 18)  Clonazepam 0.5 Mg Tabs (Clonazepam) .... Take 1 Three Times A Day As Needed 19)  Glimepiride 1 Mg Tabs (Glimepiride) .... Take 1 By Mouth Two Times A Day For Cbg>200  Allergies (verified): No Known Drug Allergies  Past History:  Past medical, surgical, family and social histories (including risk factors) reviewed, and no changes noted (except as noted below).  Past Medical History: Hypertension Diabetes mellitus type 2 Multiple Sclerosis Anxiety Pulmonary Embolus/DVT - postsurg - 11/2006 Hypothyroidism GERD  physician roster - GI - [facility] Payor neuro - [facility] cards - [facility] gyn - tavvon  Past Surgical History: Reviewed history from 02/16/2009 and no changes required. Laparoscopic cecal polyp resection 6/08 (Right hemicolectomy) Ex lap and  ileostomy/Hartmann's pouch for anastamotic leak 6/08 Ileostomy reversal 11/08 Cholecystectomy Cesarean section x 2 Abdominoplasty Total thyroidectomy - 1990  Family History: Reviewed history from 02/16/2009 and no changes required. mom - [facility] - hx breast cancer dad - died - stomach cancer age 7 both GMs - DM one sis - [facility]  Social History: Reviewed history from 02/07/2009 and no changes required. on medical leave since 04/2010 Married - lives with spouse Patient has never smoked.  Alcohol Use - no Daily Caffeine Use Does Patient Exercise:  yes  Review of Systems       see HPI above. I have reviewed all other systems and they were negative.   Physical Exam  General:  alert, well-developed, well-nourished, and cooperative to examination.    Head:  Normocephalic and atraumatic without obvious abnormalities. No apparent alopecia or balding. Eyes:  vision grossly intact; pupils equal, round and reactive to light.  conjunctiva and lids normal.    Ears:  normal pinnae bilaterally, without erythema, swelling, or tenderness to palpation. TMs clear, without effusion, or cerumen impaction. Hearing grossly normal bilaterally  Nose:  External nasal examination shows no deformity or inflammation. Nasal mucosa are pink and moist without lesions or exudates. Mouth:  teeth and gums in good repair; mucous membranes moist, without lesions or ulcers. oropharynx clear without exudate, no erythema.  Neck:  supple, full ROM, no masses, no thyromegaly; no thyroid nodules or tenderness. no JVD or carotid bruits.   Lungs:  normal respiratory effort, no intercostal retractions or use of accessory muscles; normal breath sounds bilaterally - no crackles and no wheezes.    Heart:  normal rate, regular rhythm, no murmur, and no rub. BLE without edema.  Abdomen:  soft, non-tender, normal bowel sounds, no distention; no masses and no appreciable hepatomegaly or splenomegaly.   Genitalia:  defer gyn Msk:  No  deformity or scoliosis noted of thoracic or lumbar spine.   Neurologic:  alert & oriented X3 and cranial nerves II-XII symetrically intact.  strength normal in all extremities, sensation intact to light touch, and gait normal. speech fluent without dysarthria or aphasia; follows commands with good comprehension. head tremor (mild) Skin:  dry excema changes on anterior chest x 2 spots -  Psych:  Oriented X3, memory intact for recent and remote, normally interactive, good eye contact, not anxious appearing, not depressed appearing, and not agitated.      Impression & Recommendations:  Problem # 1:  PREVENTIVE HEALTH CARE (ICD-V70.0) Patient has been counseled on age-appropriate routine health concerns for screening and prevention. These are reviewed and up-to-date. Immunizations are up-to-date or declined. Labs ordered and ECG reviewed.  Orders: EKG w/ Interpretation (93000) TLB-BMP (Basic Metabolic Panel-BMET) (80048-METABOL) TLB-Lipid Panel (80061-LIPID) TLB-CBC Platelet - w/Differential (85025-CBCD) TLB-Hepatic/Liver Function Pnl (80076-HEPATIC) TLB-TSH (Thyroid Stimulating Hormone) (84443-TSH)  Problem # 2:  HYPOTHYROIDISM (ICD-244.9)  Her updated medication list for this problem includes:    Synthroid 100 Mcg Tabs (Levothyroxine sodium) .Marland Kitchen... Take one by mouth once daily  Labs Reviewed: TSH: 5.6 (04/03/2010)    HgBA1c: 6.1 (02/16/2009) Chol: 149 (04/03/2010)   HDL: 37 (04/03/2010)   LDL: 72 (04/03/2010)   TG: 199 (04/03/2010)  Problem # 3:  HYPERTENSION (ICD-401.9)  Her updated medication list for this problem includes:    Inderal La 120 Mg Xr24h-cap (Propranolol hcl) .Marland Kitchen... 1 cap once daily    Benicar Hct 20-12.5 Mg Tabs (Olmesartan medoxomil-hctz) .Marland Kitchen... 1 by mouth once daily    Furosemide 40 Mg Tabs (Furosemide) .Marland Kitchen... Take 1 once daily  BP today: 114/80 Prior BP: 140/79 (11/20/2009)  Labs Reviewed: K+: 4.0 (04/03/2010) Creat: : 0.8 (04/03/2010)   Chol: 149 (04/03/2010)    HDL: 37 (04/03/2010)   LDL: 72 (04/03/2010)   TG: 199 (04/03/2010)  Problem # 4:  DIABETES MELLITUS, TYPE II (ICD-250.00) consider stopping OHA again with ongoing exercise and weight loss depending on a1c Her updated medication list for this problem includes:    Benicar Hct 20-12.5 Mg Tabs (Olmesartan medoxomil-hctz) .Marland Kitchen... 1 by mouth once daily    Glimepiride 1 Mg Tabs (Glimepiride) .Marland Kitchen... 1 by mouth every morning  Orders: TLB-A1C / Hgb A1C (Glycohemoglobin) (83036-A1C)  Labs Reviewed: Creat: 0.8 (04/03/2010)  Reviewed HgBA1c results: 6.1 (02/16/2009)  Problem # 5:  HYPERLIPIDEMIA (ICD-272.4)  Her updated medication list for this problem includes:    Lovaza 1 Gm Caps (Omega-3-acid ethyl esters) .Marland Kitchen... Take 2 tablets two times a day    Lipitor 10 Mg Tabs (Atorvastatin calcium) .Marland Kitchen... Take 1 by mouth once daily  Labs Reviewed: SGOT: 75.0 (04/03/2010)   SGPT: 89.0 (04/03/2010)   HDL:37 (04/03/2010), 32.30 (02/16/2009)  LDL:72 (04/03/2010)  Chol:149 (04/03/2010), 216 (02/16/2009)  Trig:199 (04/03/2010), 337.0 (02/16/2009)  Problem # 6:  MULTIPLE SCLEROSIS (ICD-340) exac summer 2011 - med changes reviewed and current symptoms noted  now on tysabri - mgmt and subsquent med leave per neuro  Complete Medication List: 1)  Synthroid 100 Mcg Tabs (Levothyroxine sodium) .... Take one by mouth once daily 2)  Inderal La 120 Mg Xr24h-cap (Propranolol hcl) .Marland Kitchen.. 1 cap once daily 3)  Prilosec 20 Mg Cpdr (Omeprazole) .... Take one tab by mouth once daily 4)  Benicar Hct 20-12.5 Mg Tabs (Olmesartan medoxomil-hctz) .Marland Kitchen.. 1 by mouth once daily 5)  Provigil 200 Mg Tabs (Modafinil) .... Take one tab by mouth two times a day as needed 6)  Multivitamins Tabs (Multiple vitamin) .Marland Kitchen.. 1 by mouth once daily 7)  Cvs Calcium-600 600 Mg Tabs (Calcium carbonate) .Marland Kitchen.. 1 by mouth qd 8)  Lomotil 2.5-0.025 Mg Tabs (Diphenoxylate-atropine) .Marland Kitchen.. 1-2 by mouth q 6 hours as needed diarrhea 9)  Lovaza 1 Gm Caps  (Omega-3-acid ethyl esters) .... Take 2 tablets two times a day 10)  Lipitor 10 Mg Tabs (Atorvastatin calcium) .... Take 1 by mouth once daily 11)  Freestyle Lite Test Strp (Glucose blood) .... Check blood sugar two times a day 12)  Freestyle Lancets Misc (Lancets) .... Use as directed 13)  Furosemide 40 Mg Tabs (Furosemide) .... Take 1 once daily 14)  Zovirax 400 Mg Tabs (Acyclovir) .... Take 1 three times a day for 5 days as needed 15)  Allopurinol 100 Mg Tabs (Allopurinol) .Marland Kitchen.. 1 by mouth once daily 16)  Clonazepam 0.5 Mg Tabs (Clonazepam) .... Take 1 three times a day as needed 17)  Glimepiride 1 Mg Tabs (Glimepiride) .Marland Kitchen.. 1 by mouth every morning 18)  Tysabri 300 Mg/72ml Conc (Natalizumab) .... Infusion every 30days  Patient Instructions: 1)  it was good to see you today. 2)  recent labs and interval history reviewed - exam and EKG look good 3)  no medication changes today - use hydrocortisone OTC as needed for excema - will consider less glimperide with weight loss when sugars are <100 consistently 4)  return for physical labs and a1c at future date when fasting - your results will then be called to you after review in 24-48 hours from the time of test completion 5)  Please schedule a follow-up appointment in 4 months for diabetes check, call sooner if problems.    Orders Added: 1)  EKG w/ Interpretation [93000] 2)  TLB-BMP (Basic Metabolic Panel-BMET) [80048-METABOL] 3)  TLB-Lipid Panel [80061-LIPID] 4)  TLB-CBC Platelet - w/Differential [85025-CBCD] 5)  TLB-Hepatic/Liver Function Pnl [80076-HEPATIC] 6)  TLB-TSH (Thyroid Stimulating Hormone) [84443-TSH] 7)  TLB-A1C / Hgb A1C (Glycohemoglobin) [83036-A1C] 8)  Est. Patient 40-64 years [99396] 9)  Est. Patient Level III [21308]   Immunization History:  Influenza Immunization History:    Influenza:  historical given @ wrk (03/23/2010)   Immunization History:  Influenza Immunization History:    Influenza:  Historical given @  wrk (03/23/2010)   Pap Smear  Procedure date:  05/13/2010  Findings:  Interpretation /Result:Negative for intraepithelial Lesion or Malignancy.

## 2010-07-23 NOTE — Progress Notes (Signed)
Summary: [name] pt - high cbgs  Phone Note Call from Patient   Summary of Call: Pt is currently on prednisone dose pak. She has a few more days left. CBG's have been elevated. She is currently taking glimepride 2 mg two times a day. CBG 2 1/2 hours after lunch was 289. C/o blurred vision, h/a and feels overall "bad". She has not taken anything for h/a and is concerned. Please advise, pt's PCP is out of the office this afternoon.  Initial call taken by: Lamar Sprinkles, CMA,  February 07, 2010 2:35 PM  Follow-up for Phone Call        OK to try NSAID for headache. Would not treat elevated CBG at this time. Continue present meds. OV if worse Follow-up by: Jacques Navy MD,  February 07, 2010 4:21 PM  Additional Follow-up for Phone Call Additional follow up Details #1::        Pt informed, she took 2 Advil around 3pm. Will check in with patient later for update............Marland KitchenLamar Sprinkles, CMA  February 07, 2010 4:22 PM   Cbg at 5:30 is 133, Bp 140/90. She is feeling better overall with decreased h/a. Pt will continue to monitor symptoms and report back to office w/update.  Additional Follow-up by: Lamar Sprinkles, CMA,  February 07, 2010 5:40 PM    Additional Follow-up for Phone Call Additional follow up Details #2::    please remind pt to aggressively hydrate - lots of water! this will help with headache too - Follow-up by: Newt Lukes MD,  February 08, 2010 7:51 AM   Appended Document: [name] pt - high cbgs Pt informed

## 2010-07-23 NOTE — Assessment & Plan Note (Signed)
Summary: NP ORTHOTICS,[facility]   Vital Signs:  Patient profile:   55 year old female Height:      65 inches Weight:      209 pounds BP sitting:   122 / 78  Vitals Entered By: Lillia Pauls CMA (June 25, 2009 11:52 AM)  Primary Provider:  Newt Lukes MD   History of Present Illness: [name] is referred courtesy of Dr Charlett Blake she has had bilat lateral foot pain this has gotten worse over past 2 months feels better in more supportive shoe  for first time she had a gout attach in RT foot  Her other new finding is a nodule that developed in the right arch this is painful and arose at night she thought this a hematoma   Dr Charlett Blake felt problems related to arch structure and referred to see if custom orthoticis indicated   Allergies (verified): No Known Drug Allergies  Physical Exam  General:  obese ,in no acute distress; alert,appropriate and cooperative throughout examination Msk:  bilateral pronation with RT > left on standing walking gait confirms that she pronates and more so on RT sitting she actually has a high arch shape more suggestive of cavus foot but this collapses partially with standing sinus tarsi swollen on both feet  on RT ant third of PF there is a nodule that is tender and thickened suggestive of partial tear with retraction   Impression & Recommendations:  Problem # 1:  FOOT PAIN, BILATERAL (ICD-729.5)  will try her in custom orthotics to support arch I think she has findings that also suggest bilat sinus tarsi syndrome related to the arch breakdown as Dr Charlett Blake suggested  Patient was fitted for a standard, cushioned, semi-rigid orthotic.  The orthotic was heated and the patient stood on the orthotic blank positioned on the orthotic stand. The patient was positioned in subtalar neutral position and 10 degrees of ankle dorsiflexion in a weight bearing stance. After completion of molding a stable based was applied to the orthotic blank.   The blank  was ground to a stable position for weight bearing. 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 Take one by mouth once daily 2)  Inderal La 120 Mg Xr24h-cap (Propranolol hcl) .Marland Kitchen.. 1 cap once daily 3)  Prilosec 20 Mg Cpdr (Omeprazole) .... Take one tab by mouth once daily 4)  Benicar Hct 20-12.5 Mg Tabs (Olmesartan medoxomil-hctz) .Marland Kitchen.. 1 by mouth once daily 5)  Provigil 200 Mg Tabs (Modafinil) .... Take one tab by mouth once daily as needed 6)  Imodium A-d 2 Mg Tabs (  Loperamide hcl) .... 6-8 tablets daily prn 7)  Copaxone 20 Mg/ml Kit (Glatiramer acetate) .... Daily injection 8)  Multivitamins Tabs (Multiple vitamin) .Marland Kitchen.. 1 by mouth once daily 9)  Cvs Calcium-600 600 Mg Tabs (Calcium carbonate) .Marland Kitchen.. 1 by mouth qd 10)  Lomotil 2.5-0.025 Mg Tabs (Diphenoxylate-atropine) .Marland Kitchen.. 1-2 by mouth q 6 hours as needed diarrhea 11)  Lovaza 1 Gm Caps (Omega-3-acid ethyl esters) .... Take 2 tablets two times a day 12)  Lipitor 10 Mg Tabs (Atorvastatin calcium) .... Take 1 by mouth once  daily 13)  Freestyle Lite Test Strp (Glucose blood) .... Check blood sugar two times a day 14)  Freestyle Lancets Misc (Lancets) .... Use as directed 15)  Furosemide 40 Mg Tabs (Furosemide) .... Take 1 once daily prn 16)  Zovirax 400 Mg Tabs (Acyclovir) .... Take 1 three times a day for 5 days 17)  Allopurinol 100 Mg Tabs (Allopurinol) .Marland Kitchen.. 1 by mouth once daily

## 2010-07-23 NOTE — Assessment & Plan Note (Signed)
Summary: ORTHOTICS,[facility]   Vital Signs:  Patient profile:   54 year old female BP sitting:   126 / 84  Vitals Entered By: Lillia Pauls CMA (Nov 06, 2009 1:42 PM)  History of Present Illness: Patient seen in f/u with B lateral foot pain - fitted with thin orthotic and base. Feet are feeling much better, but many pairs of shoes cannot fit her with the thin orthotic.  quite a bit better  for first time she had a gout attach in RT foot recently  ROS: o/w has MS, other medical problems, generally doing OK right now. Recent hosp noted.  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  f/u after these materials are in  Problem # 2:  FOOT PAIN, BILATERAL (ICD-729.5)  Complete Medication List: 1)  Synthroid 100 Mcg Tabs (Levothyroxine sodium) .... Take one by mouth once daily 2)  Inderal La 120 Mg Xr24h-cap (Propranolol hcl) .Marland Kitchen.. 1 cap once daily 3)  Prilosec 20 Mg Cpdr (Omeprazole) .... Take one tab by mouth once daily 4)  Benicar Hct 20-12.5 Mg Tabs (Olmesartan medoxomil-hctz) .Marland Kitchen.. 1 by mouth once daily 5)  Provigil 200 Mg Tabs (Modafinil) .... Take one tab by mouth two times a day as needed 6)  Imodium A-d 2 Mg Tabs (Loperamide hcl) .... 6-8 tablets daily prn 7)  Copaxone 20 Mg/ml Kit (Glatiramer acetate) ....  Daily injection 8)  Multivitamins Tabs (Multiple vitamin) .Marland Kitchen.. 1 by mouth once daily 9)  Cvs Calcium-600 600 Mg Tabs (Calcium carbonate) .Marland Kitchen.. 1 by mouth qd 10)  Lomotil 2.5-0.025 Mg Tabs (Diphenoxylate-atropine) .Marland Kitchen.. 1-2 by mouth q 6 hours as needed diarrhea 11)  Lovaza 1 Gm Caps (Omega-3-acid ethyl esters) .... Take 2 tablets two times a day 12)  Lipitor 10 Mg Tabs (Atorvastatin calcium) .... Take 1 by mouth once daily 13)  Freestyle Lite Test Strp (Glucose blood) .... Check blood sugar two times a day 14)  Freestyle Lancets Misc (Lancets) .... Use as directed 15)  Furosemide 40 Mg Tabs (Furosemide) .... Take 1 once daily prn 16)  Zovirax 400 Mg Tabs (Acyclovir) .... Take 1 three times a day for 5 days 17)  Allopurinol 100 Mg Tabs (Allopurinol) .Marland Kitchen.. 1 by mouth once daily 18)  Metrogel-vaginal 0.75 % Gel (Metronidazole) .... Apply pv at bedtime x 5 days 19)  Diflucan 150 Mg Tabs (Fluconazole) .... Take 1 tab once daily repeat daily as needed 20)  Clonazepam 0.5 Mg Tabs (Clonazepam) .... Take 1 three times a day as needed

## 2010-07-23 NOTE — Progress Notes (Signed)
Summary: med refills  Phone Note Call from Patient   Caller: Patient Reason for Call: Refill Medication Summary of Call: Pt is requesting refills on Lipitor, omeprazole, Lomotil, synthroid, and lasix. All 90 supply sent to [facility] pharm Initial call taken by: Orlan Leavens,  Nov 14, 2009 11:46 AM  Follow-up for Phone Call        Dr. Felicity Coyer sent everything already except for Lomotil. Is it ok to refill # 90 ? Follow-up by: Orlan Leavens,  Nov 14, 2009 11:50 AM  Additional Follow-up for Phone Call Additional follow up Details #1::        yes - thanks Additional Follow-up by: Newt Lukes MD,  Nov 14, 2009 1:15 PM    Additional Follow-up for Phone Call Additional follow up Details #2::    Per patient she take 6 per day lomotil. In [place] per Gastro to take 1-2 by mouth q 6 hours as needed. If so need to correct directions. Pls advise Follow-up by: Orlan Leavens,  Nov 14, 2009 1:52 PM  Additional Follow-up for Phone Call Additional follow up Details #3:: Details for Additional Follow-up Action Taken: will defer refill on this to GI as Leone Payor sees fit  as i am not comfortable giving #180 each month! thanks - Newt Lukes MD  Nov 14, 2009 2:13 PM   Verbal OK per Dr. Leone Payor to refill #180/month.  Refill sent to [facility].  Pt notified. Francee Piccolo CMA Duncan Dull)  Nov 14, 2009 4:51 PM  Additional Follow-up by: [name] MD, [name],  Nov 15, 2009 5:54 AM  New/Updated Medications: LOMOTIL 2.5-0.025 MG  TABS (DIPHENOXYLATE-ATROPINE) 1-2 by mouth q 6 hours as needed diarrhea Prescriptions: LOMOTIL 2.5-0.025 MG  TABS (DIPHENOXYLATE-ATROPINE) 1-2 by mouth q 6 hours as needed diarrhea  #540 x 1   Entered by:   Francee Piccolo CMA (AAMA)   Authorized by:   [name] MD, [facility]   Signed by:   Francee Piccolo CMA (AAMA) on 11/14/2009   Method used:   Printed then faxed to ...       [facility]* (retail)       [address]       [address] Shipping/mailing       [place], [place]  [address]       Ph: [phone]       Fax: [phone]   RxID:   [id number] PRILOSEC 20 MG  CPDR (OMEPRAZOLE) take one tab by mouth once daily  #90 x 0   Entered by:   Orlan Leavens   Authorized by:   Newt Lukes MD   Signed by:   Orlan Leavens on 11/14/2009   Method used:   Electronically to        [facility]* (retail)       [address]       [address] Shipping/mailing       [place], [place]  [address]       Ph: [phone]       Fax: [phone]   RxID:   [id number] FUROSEMIDE 40 MG TABS (FUROSEMIDE) take 1 once daily prn  #90 x 0   Entered by:   Orlan Leavens   Authorized by:   Newt Lukes MD   Signed by:   Orlan Leavens on 11/14/2009   Method used:   Electronically to        [facility]* (retail)       [address]       [address] Shipping/mailing       [place], [place]  [address]       Ph: [phone]       Fax: [phone]   RxID:   [id number] LIPITOR 10 MG TABS (ATORVASTATIN CALCIUM) take 1 by mouth once daily  #90 x 0   Entered by:   Orlan Leavens   Authorized by:   Newt Lukes MD   Signed by:   Orlan Leavens on 11/14/2009   Method used:   Electronically to        [facility]* (retail)       [address]       [address] Shipping/mailing       [place], [place]  [address]       Ph: [phone]       Fax: [phone]   RxID:   [id number] SYNTHROID 100 MCG  TABS (LEVOTHYROXINE SODIUM) take one by mouth once daily  #90 x 0   Entered by:   Orlan Leavens   Authorized by:   Newt Lukes MD   Signed by:   Orlan Leavens on 11/14/2009   Method used:   Electronically to        [facility]* (retail)       [address]       [address] Shipping/mailing       [place], [place]  [address]       Ph: [phone]       Fax: [phone]   RxID:   [id number]   Appended Document: med refills Lomotil quantity  verified with Thayer Ohm at Outpatient Pharmacy.

## 2010-07-23 NOTE — Letter (Signed)
Summary: [facility]   Imported By: Lennie Odor 04/16/2010 11:25:07  _____________________________________________________________________  External Attachment:    Type:   Image     Comment:   External Document

## 2010-07-23 NOTE — Consult Note (Signed)
Summary: [facility]   Imported By: Marily Memos 06/25/2009 13:30:00  _____________________________________________________________________  External Attachment:    Type:   Image     Comment:   External Document

## 2010-07-23 NOTE — Progress Notes (Signed)
Summary: elev BS  Phone Note Call from Patient   Caller: Patient Reason for Call: Talk to Doctor Summary of Call: Currently taking prednisone dose pack. Blood sugar is now elevated. BS this am @ 232, Just check @ 2:05 BS 258. Pls advise on what she need to do Initial call taken by: Orlan Leavens RMA,  February 04, 2010 2:10 PM  Follow-up for Phone Call        increase glimepiride to two times a day for cbg >200 - if still >200 after 2 days of two times a day dose, call and we will continue to increase med dosing - thanks Follow-up by: Newt Lukes MD,  February 04, 2010 2:30 PM  Additional Follow-up for Phone Call Additional follow up Details #1::        Notified pt with md recommendations Additional Follow-up by: Orlan Leavens RMA,  February 04, 2010 2:35 PM    New/Updated Medications: GLIMEPIRIDE 1 MG TABS (GLIMEPIRIDE) take 1 by mouth two times a day for cbg>200

## 2010-07-23 NOTE — Letter (Signed)
Summary: Colonoscopy Date Change Letter  [facility]  [address] [place], [place] [address]   Phone: [phone]  Fax: [phone]      October 15, 2009 MRN: [id number]   [facility] [address] [place], [place]  [address]   Dear Ms. Hankey,   Previously you were recommended to have a repeat colonoscopy around this time. Your chart was recently reviewed by Dr. [name] of [facility]. Follow up colonoscopy is now recommended in May 2013. This revised recommendation is based on current, nationally recognized guidelines for colorectal cancer screening and polyp surveillance. These guidelines are endorsed by the American Cancer Society, [employer] on Colorectal Cancer as well as numerous other major medical organizations.  Please understand that our recommendation assumes that you do not have any new symptoms such as bleeding, a change in bowel habits, anemia, or significant abdominal discomfort. If you do have any concerning GI symptoms or want to discuss the guideline recommendations, please call to arrange an office visit at your earliest convenience. Otherwise we will keep you in our reminder system and contact you 1-2 months prior to the date listed above to schedule your next colonoscopy.  Thank you,  [name], M.D.  [facility] [phone]

## 2010-07-23 NOTE — Progress Notes (Signed)
Summary: benicar & glimepiride  Phone Note Call from Patient   Caller: Patient Reason for Call: Talk to Nurse Summary of Call: Pt req refill on her glimepiride and Benicar to be sent to [facility] 90 supply Initial call taken by: Orlan Leavens,  November 26, 2009 2:30 PM  Follow-up for Phone Call        Notified pt rx sent to [facility]. Follow-up by: Orlan Leavens,  November 26, 2009 2:45 PM    New/Updated Medications: GLIMEPIRIDE 1 MG TABS (GLIMEPIRIDE) take 1 by mouth once daily Prescriptions: GLIMEPIRIDE 1 MG TABS (GLIMEPIRIDE) take 1 by mouth once daily  #90 x 0   Entered by:   Orlan Leavens   Authorized by:   Newt Lukes MD   Signed by:   Orlan Leavens on 11/26/2009   Method used:   Faxed to ...       [facility]* (mail-order)             ,          Ph: [phone]       Fax: [phone]   RxID:   [id number] BENICAR HCT 20-12.5 MG TABS (OLMESARTAN MEDOXOMIL-HCTZ) 1 by mouth once daily  #90 x 0   Entered by:   Orlan Leavens   Authorized by:   Newt Lukes MD   Signed by:   Orlan Leavens on 11/26/2009   Method used:   Faxed to ...       [facility]* (mail-order)             ,          Ph: [phone]       Fax: [phone]   RxID:   [id number]

## 2010-07-23 NOTE — Letter (Signed)
Summary: *Consult Note  [facility]  [address]   [place], [place] [address]   Phone: [phone]  Fax: [phone]    Re:    KYMBERLI WIEGAND DOB:    Nov 26, 1955 Lunette Stands, MD [facility] [address] [place], [place] [address] Fax: [phone]   Dear Tobi Bastos:    Thank you for requesting that we see the above patient for consultation.  A copy of the detailed office note will be sent under separate cover, for your review.  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  I agree with you that her degree of pronation is leading to lateral foot compression and her symptoms.  I will also try to work with her to place arch support in dress shoes that will not fit an orthotic.   New Orders include:  1)  Consultation Level II [99242] 2)  Orthotic Materials, each unit [L3002]   After today's visit, the patients current medications include: 1)  SYNTHROID 100 MCG  TABS (LEVOTHYROXINE SODIUM) take one by mouth once daily 2)  INDERAL LA 120 MG XR24H-CAP (PROPRANOLOL HCL) 1 cap once daily 3)  PRILOSEC 20 MG  CPDR (OMEPRAZOLE) take one tab by mouth once daily 4)  BENICAR HCT 20-12.5 MG TABS (OLMESARTAN MEDOXOMIL-HCTZ) 1 by mouth once daily 5)  PROVIGIL 200 MG  TABS (MODAFINIL) take one tab by mouth once daily as needed 6)  IMODIUM A-D 2 MG TABS (LOPERAMIDE HCL) 6-8 tablets daily prn 7)  COPAXONE 20 MG/ML KIT (GLATIRAMER ACETATE) daily injection 8)  MULTIVITAMINS  TABS (MULTIPLE VITAMIN) 1 by mouth once daily 9)  [employer] CALCIUM-600 600 MG TABS (CALCIUM CARBONATE) 1 by mouth qd 10)  LOMOTIL 2.5-0.025 MG  TABS (DIPHENOXYLATE-ATROPINE) 1-2 by mouth q 6 hours as needed diarrhea 11)  LOVAZA 1 GM CAPS (OMEGA-3-ACID ETHYL ESTERS) Take 2 tablets two times a day 12)  LIPITOR 10 MG TABS (ATORVASTATIN  CALCIUM) take 1 by mouth once daily 13)  FREESTYLE LITE TEST  STRP (GLUCOSE BLOOD) check blood sugar two times a day 14)  FREESTYLE LANCETS  MISC (LANCETS) use as directed 15)  FUROSEMIDE 40 MG TABS (FUROSEMIDE) take 1 once daily prn 16)  ZOVIRAX 400 MG TABS (ACYCLOVIR) take 1 three times a day for 5 days 17)  ALLOPURINOL 100 MG TABS (ALLOPURINOL) 1 by mouth once daily   Thank you for this consultation.  If you have any further questions regarding the care of this patient, please do not hesitate to contact me @ [phone].  Thank you for this opportunity to look after your patient.  Sincerely,  Vincent Gros MD

## 2010-07-23 NOTE — Progress Notes (Signed)
Summary: resent benicar & glimepiride  Phone Note Call from Patient   Caller: Patient Summary of Call: Recieved call from pt rx was sent to wrong pharmacy. should have went to [facility]. contacted [facility] spoke with pharmacy tech [place]. Cx order for glimepiride & benicar, and she should'nt be cahrge for process. will send new rx to [facility]. Initial call taken by: Orlan Leavens,  November 27, 2009 3:48 PM    Prescriptions: GLIMEPIRIDE 1 MG TABS (GLIMEPIRIDE) take 1 by mouth once daily  #90 x 0   Entered by:   Orlan Leavens   Authorized by:   Newt Lukes MD   Signed by:   Orlan Leavens on 11/27/2009   Method used:   Electronically to        [facility]* (retail)       [address]       [address] Shipping/mailing       [place], [place]  [address]       Ph: [phone]       Fax: [phone]   RxID:   [id number] BENICAR HCT 20-12.5 MG TABS (OLMESARTAN MEDOXOMIL-HCTZ) 1 by mouth once daily  #90 x 0   Entered by:   Orlan Leavens   Authorized by:   Newt Lukes MD   Signed by:   Orlan Leavens on 11/27/2009   Method used:   Electronically to        [facility]* (retail)       [address]       [address] Shipping/mailing       [place], [place]  [address]       Ph: [phone]       Fax: [phone]   RxID:   [id number]

## 2010-07-23 NOTE — Progress Notes (Signed)
Summary: omeprazole  Phone Note Refill Request Message from:  Fax from Pharmacy on January 23, 2010 10:45 AM  Refills Requested: Medication #1:  PRILOSEC 20 MG  CPDR take one tab by mouth once daily   Last Refilled: 11/14/2009  Method Requested: Electronic Initial call taken by: Orlan Leavens RMA,  January 23, 2010 10:46 AM    Prescriptions: PRILOSEC 20 MG  CPDR (OMEPRAZOLE) take one tab by mouth once daily  #90 x 0   Entered by:   Orlan Leavens RMA   Authorized by:   Newt Lukes MD   Signed by:   Orlan Leavens RMA on 01/23/2010   Method used:   Electronically to        [facility]* (retail)       [address]       [address] Shipping/mailing       [place], [place]  [address]       Ph: [phone]       Fax: [phone]   RxID:   [id number]

## 2010-07-23 NOTE — Progress Notes (Signed)
Summary: test strips  Phone Note Refill Request Message from:  Patient on February 07, 2010 1:55 PM  Refills Requested: Medication #1:  FREESTYLE LITE TEST  STRP check blood sugar two times a day   Dosage confirmed as above?Dosage Confirmed   Last Refilled: 02/19/2009  Method Requested: Electronic Initial call taken by: Brenton Grills MA,  February 07, 2010 1:55 PM    Prescriptions: FREESTYLE LITE TEST  STRP (GLUCOSE BLOOD) check blood sugar two times a day  #100 x 6   Entered by:   Brenton Grills MA   Authorized by:   Newt Lukes MD   Signed by:   Brenton Grills MA on 02/07/2010   Method used:   Electronically to        [facility] [address] [phone]* (retail)       [address] [place], [place]  [address]       Ph: [phone]       Fax: [phone]   RxID:   [id number]

## 2010-07-23 NOTE — Progress Notes (Signed)
Summary: pap smear  Phone Note Call from Patient   Caller: Patient Reason for Call: Referral Summary of Call: Pt is needing referral to GYN. Need annual pap smear. (Dr. Olivia Mackie) Initial call taken by: Orlan Leavens,  December 05, 2009 4:34 PM  Follow-up for Phone Call        order done - Follow-up by: Newt Lukes MD,  December 05, 2009 10:52 PM

## 2010-07-23 NOTE — Medication Information (Signed)
Summary: [facility]  [facility]   Imported By: Sherian Rein 02/13/2010 15:06:26  _____________________________________________________________________  External Attachment:    Type:   Image     Comment:   External Document

## 2010-07-23 NOTE — Progress Notes (Signed)
Summary: labs  Phone Note Call from Patient Call back at Work Phone [phone]   Caller: Patient Reason for Call: Talk to Doctor, Lab or Test Results Details for Reason: FYI Summary of Call: Recieved results from health screening. Liver functions are abnormal. Req rsults to be fax over to Dr. Riley Lam @ [phone]. Also want to let md know will make appt for CPX to discuss other labs. Will enter in EMR Initial call taken by: Orlan Leavens RMA,  April 12, 2010 8:34 AM  Follow-up for Phone Call        LFTs not sig different than 01/2007 on review - will discuss further at OV - thanks Follow-up by: Newt Lukes MD,  April 12, 2010 9:23 AM  Additional Follow-up for Phone Call Additional follow up Details #1::        Notified pt with md response, also fax labs to Dr. Riley Lam Additional Follow-up by: Orlan Leavens RMA,  April 12, 2010 9:50 AM      -  Date:  04/03/2010    Cholesterol: 149    LDL: 72    HDL: 37    Triglycerides: 644    WBC: 9.0    HGB: 12.6    HCT: 38.9    RBC: 4.1    PLT: 311    MCV: 94.4    RDW: 16.2    Neutrophil: 52.0    Lymphs: 36.0    Monos: 8.0    Eos: 3.0    Basophil: 1.0    TSH: 5.6    BG Random: 94    BUN: 11    Creatinine: 0.8    Sodium: 142    Potassium: 4.0    Chloride: 101    CO2 Total: 28    SGOT (AST): 75.0    SGPT (ALT): 89.0    T. Bilirubin: 0.7    Alk Phos: 122    Calcium: 10.0    Total Protein: 7.4    Albumin: 4.7

## 2010-07-23 NOTE — Op Note (Signed)
Summary: Infusion order/[place]  Infusion order/[place]   Imported By: Sherian Rein 04/12/2010 10:48:19  _____________________________________________________________________  External Attachment:    Type:   Image     Comment:   External Document

## 2010-07-23 NOTE — Progress Notes (Signed)
Summary: Med refills  Phone Note Refill Request Message from:  Fax from Pharmacy on February 26, 2010 9:52 AM  Refills Requested: Medication #1:  BENICAR HCT 20-12.5 MG TABS 1 by mouth once daily  Medication #2:  PROVIGIL 200 MG  TABS take one tab by mouth two times a day as needed  Medication #3:  GLIMEPIRIDE 1 MG TABS take 1 by mouth two times a day for cbg>200.  Medication #4:  FUROSEMIDE 40 MG TABS take 1 once daily prn [facility] [phone] LAST OV 07/23/09 Pt is overdue for CPX Is it ok to send # 90   Method Requested: Electronic Next Appointment Scheduled: NONE Initial call taken by: Orlan Leavens RMA,  February 26, 2010 9:54 AM  Follow-up for Phone Call        yes - ok to fill but please remind pt to sched for cpx and labs - thanks! Follow-up by: Newt Lukes MD,  February 26, 2010 9:58 AM  Additional Follow-up for Phone Call Additional follow up Details #1::        Sent to pharmacy will notify pt CPX is overdue no addtional refills until appt Additional Follow-up by: Orlan Leavens RMA,  February 26, 2010 10:04 AM    Prescriptions: PROVIGIL 200 MG  TABS (MODAFINIL) take one tab by mouth two times a day as needed  #60 x 1   Entered by:   Orlan Leavens RMA   Authorized by:   Newt Lukes MD   Signed by:   Orlan Leavens RMA on 02/26/2010   Method used:   Historical   RxID:   [id number] GLIMEPIRIDE 1 MG TABS (GLIMEPIRIDE) take 1 by mouth two times a day for cbg>200  #90 x 0   Entered by:   Orlan Leavens RMA   Authorized by:   Newt Lukes MD   Signed by:   Orlan Leavens RMA on 02/26/2010   Method used:   Electronically to        [facility]* (retail)       [address]       [address] Shipping/mailing       [place], [place]  [address]       Ph: [phone]       Fax: [phone]   RxID:   [id number] FUROSEMIDE 40 MG TABS (FUROSEMIDE) take 1 once daily prn  #90 x 0   Entered by:   Orlan Leavens RMA   Authorized by:   Newt Lukes MD   Signed by:   Orlan Leavens RMA on 02/26/2010   Method used:   Electronically to        [facility]* (retail)       [address]       [address] Shipping/mailing       [place], [place]  [address]       Ph: [phone]       Fax: [phone]   RxID:   [id number] LIPITOR 10 MG TABS (ATORVASTATIN CALCIUM) take 1 by mouth once daily  #90 x 0   Entered by:   Orlan Leavens RMA   Authorized by:   Newt Lukes MD   Signed by:   Orlan Leavens RMA on 02/26/2010   Method used:   Electronically to        [facility]* (retail)       [address]  [address] Shipping/mailing       [place], [place]  [address]       Ph: [phone]       Fax: [phone]   RxID:   [id number] SYNTHROID 100 MCG  TABS (LEVOTHYROXINE SODIUM) take one by mouth once daily  #90 x 0   Entered by:   Orlan Leavens RMA   Authorized by:   Newt Lukes MD   Signed by:   Orlan Leavens RMA on 02/26/2010   Method used:   Electronically to        [facility]* (retail)       [address]       [address] Shipping/mailing       [place], [place]  [address]       Ph: [phone]       Fax: [phone]   RxID:   [id number] BENICAR HCT 20-12.5 MG TABS (OLMESARTAN MEDOXOMIL-HCTZ) 1 by mouth once daily  #90 x 0   Entered by:   Orlan Leavens RMA   Authorized by:   Newt Lukes MD   Signed by:   Orlan Leavens RMA on 02/26/2010   Method used:   Electronically to        [facility]* (retail)       [address]       [address] Shipping/mailing       [place], [place]  [address]       Ph: [phone]       Fax: [phone]   RxID:   [id number]

## 2010-07-23 NOTE — Letter (Signed)
Summary: [facility]  [facility]   Imported By: Lester [place] 11/05/2009 08:57:08  _____________________________________________________________________  External Attachment:    Type:   Image     Comment:   External Document

## 2010-07-23 NOTE — Assessment & Plan Note (Signed)
Summary: EAR ACHE/NWS   Vital Signs:  Patient profile:   55 year old female Height:      65 inches (165.10 cm) Weight:      209 pounds (95.00 kg) O2 Sat:      96 % on Room air Temp:     99.0 degrees F (37.22 degrees C) oral Pulse rate:   70 / minute BP sitting:   130 / 84  (left arm) Cuff size:   large  Vitals Entered By: Orlan Leavens (July 23, 2009 3:47 PM)  O2 Flow:  Room air CC: (L) EARACHE Is Patient Diabetic? Yes Did you bring your meter with you today? No Pain Assessment Patient in pain? no        Primary Care Provider:  Newt Lukes MD  CC:  (L) EARACHE.  History of Present Illness: c/o left ear ache pain is moderate 4-5/10 and constantly present a/w decreased hearing on left side no ringing or fluid sounds -  no drainage from this ear +inc pain with chewing +hx same symptoms last year with ear infection - eventually ruptured (same ear as now) and drained +tender to touch at left mandible region but now overt swelling noted  of note, [place]   Current Medications (verified): 1)  Synthroid 100 Mcg  Tabs (Levothyroxine Sodium) .... Take One By Mouth Once Daily 2)  Inderal La 120 Mg Xr24h-Cap (Propranolol Hcl) .Marland Kitchen.. 1 Cap Once Daily 3)  Prilosec 20 Mg  Cpdr (Omeprazole) .... Take One Tab By Mouth Once Daily 4)  Benicar Hct 20-12.5 Mg Tabs (Olmesartan Medoxomil-Hctz) .Marland Kitchen.. 1 By Mouth Once Daily 5)  Provigil 200 Mg  Tabs (Modafinil) .... Take One Tab By Mouth Once Daily As Needed 6)  Imodium A-D 2 Mg Tabs (Loperamide Hcl) .... 6-8 Tablets Daily Prn 7)  Copaxone 20 Mg/ml Kit (Glatiramer Acetate) .... Daily Injection 8)  Multivitamins  Tabs (Multiple Vitamin) .Marland Kitchen.. 1 By Mouth Once Daily 9)  Cvs Calcium-600 600 Mg Tabs (Calcium Carbonate) .Marland Kitchen.. 1 By Mouth Qd 10)  Lomotil 2.5-0.025 Mg  Tabs (Diphenoxylate-Atropine) .Marland Kitchen.. 1-2 By Mouth Q 6 Hours As Needed Diarrhea 11)  Lovaza 1 Gm Caps (Omega-3-Acid Ethyl Esters) .... Take 2 Tablets  Two Times A Day 12)  Lipitor 10 Mg Tabs (Atorvastatin Calcium) .... Take 1 By Mouth Once Daily 13)  Freestyle Lite Test  Strp (Glucose Blood) .... Check Blood Sugar Two Times A Day 14)  Freestyle Lancets  Misc (Lancets) .... Use As Directed 15)  Furosemide 40 Mg Tabs (Furosemide) .... Take 1 Once Daily Prn 16)  Zovirax 400 Mg Tabs (Acyclovir) .... Take 1 Three Times A Day For 5 Days 17)  Allopurinol 100 Mg Tabs (Allopurinol) .Marland Kitchen.. 1 By Mouth Once Daily  Allergies (verified): No Known Drug Allergies  Past History:  Past Medical History: Hypertension Diabetes mellitus type 2, diet controlled Multiple Sclerosis Anxiety Pulmonary Embolus/DVT - postsurg - 6/08 Hypothyroidism GERD  physician rooster - [facility] neuro - Jeffries cards - [place]  Review of Systems       The patient complains of decreased hearing.  The patient denies fever, hoarseness, and headaches.    Physical Exam  General:  alert, well-developed, well-nourished, and cooperative to examination.    Eyes:  vision grossly intact; pupils equal, round and reactive to light.  conjunctiva and lids normal.    Ears:  left ear with min erthema and crusting of canal, no edema - no obvious disruption  of TM and no active dranage - hazy TM L>R which is normal - Mouth:  teeth and gums in good repair; mucous membranes moist, without lesions or ulcers. oropharynx clear without exudate, no erythema.  Msk:  +pain over palpation of mandible region with mild swelling on left side as compared to right Cervical Nodes:  +LAD with tenderness on left anterior chain   Impression & Recommendations:  Problem # 1:  OTITIS MEDIA, LEFT (ICD-382.9)  hx same and rupture of this eardrum last year -  pt with DM and immune suppressants for MS tx ongoing... tx with systemic abx as well as cipro HC given hx rupture though TM appears intact at this time and no overt externa infx at present consider ENT eval if recurrent symptoms  Her updated  medication list for this problem includes:    Amoxicillin-pot Clavulanate 875-125 Mg Tabs (Amoxicillin-pot clavulanate) .Marland Kitchen... 1 by mouth two times a day x 7 days  Orders: Prescription Created Electronically [phone])  Complete Medication List: 1)  Synthroid 100 Mcg Tabs (Levothyroxine sodium) .... Take one by mouth once daily 2)  Inderal La 120 Mg Xr24h-cap (Propranolol hcl) .Marland Kitchen.. 1 cap once daily 3)  Prilosec 20 Mg Cpdr (Omeprazole) .... Take one tab by mouth once daily 4)  Benicar Hct 20-12.5 Mg Tabs (Olmesartan medoxomil-hctz) .Marland Kitchen.. 1 by mouth once daily 5)  Provigil 200 Mg Tabs (Modafinil) .... Take one tab by mouth once daily as needed 6)  Imodium A-d 2 Mg Tabs (Loperamide hcl) .... 6-8 tablets daily prn 7)  Copaxone 20 Mg/ml Kit (Glatiramer acetate) .... Daily injection 8)  Multivitamins Tabs (Multiple vitamin) .Marland Kitchen.. 1 by mouth once daily 9)  Cvs Calcium-600 600 Mg Tabs (Calcium carbonate) .Marland Kitchen.. 1 by mouth qd 10)  Lomotil 2.5-0.025 Mg Tabs (Diphenoxylate-atropine) .Marland Kitchen.. 1-2 by mouth q 6 hours as needed diarrhea 11)  Lovaza 1 Gm Caps (Omega-3-acid ethyl esters) .... Take 2 tablets two times a day 12)  Lipitor 10 Mg Tabs (Atorvastatin calcium) .... Take 1 by mouth once daily 13)  Freestyle Lite Test Strp (Glucose blood) .... Check blood sugar two times a day 14)  Freestyle Lancets Misc (Lancets) .... Use as directed 15)  Furosemide 40 Mg Tabs (Furosemide) .... Take 1 once daily prn 16)  Zovirax 400 Mg Tabs (Acyclovir) .... Take 1 three times a day for 5 days 17)  Allopurinol 100 Mg Tabs (Allopurinol) .Marland Kitchen.. 1 by mouth once daily 18)  Amoxicillin-pot Clavulanate 875-125 Mg Tabs (Amoxicillin-pot clavulanate) .Marland Kitchen.. 1 by mouth two times a day x 7 days 19)  Cipro Hc 0.2-1 % Susp (Ciprofloxacin-hydrocortisone) .... 3 drops to left ear two times a day x 7days  Patient Instructions: 1)  antibiotics - pills and drops as discussed - your prescriptions have been electronically submitted to your pharmacy.  Please take as directed. Contact our office if you believe you're having problems with the medication(s).  Prescriptions: CIPRO HC 0.2-1 % SUSP (CIPROFLOXACIN-HYDROCORTISONE) 3 drops to left ear two times a day x 7days  #1 x 0   Entered and Authorized by:   Newt Lukes MD   Signed by:   Newt Lukes MD on 07/23/2009   Method used:   Electronically to        [facility] [address] [phone]* (retail)       [address] [place], [place]  [address]       Ph: [phone]       Fax:  [phone]   RxID:   [id number] AMOXICILLIN-POT CLAVULANATE 875-125 MG TABS (AMOXICILLIN-POT CLAVULANATE) 1 by mouth two times a day x 7 days  #14 x 0   Entered and Authorized by:   Newt Lukes MD   Signed by:   Newt Lukes MD on 07/23/2009   Method used:   Electronically to        [facility] [address] [phone]* (retail)       [address] [place], [place]  [address]       Ph: [phone]       Fax: [phone]   RxID:   [id number]

## 2010-07-23 NOTE — Progress Notes (Signed)
Summary: med refills  Phone Note Refill Request Message from:  Fax from Pharmacy on May 28, 2010 2:02 PM  Refills Requested: Medication #1:  FUROSEMIDE 40 MG TABS take 1 once daily  Medication #2:  LIPITOR 10 MG TABS take 1 by mouth once daily  Medication #3:  BENICAR HCT 20-12.5 MG TABS 1 by mouth once daily  Medication #4:  GLIMEPIRIDE 1 MG TABS 1 by mouth every morning [facility]/ [phone]   Method Requested: Electronic Initial call taken by: Orlan Leavens RMA,  May 28, 2010 2:03 PM    Prescriptions: GLIMEPIRIDE 1 MG TABS (GLIMEPIRIDE) 1 by mouth every morning  #90 x 3   Entered by:   Orlan Leavens RMA   Authorized by:   Newt Lukes MD   Signed by:   Orlan Leavens RMA on 05/28/2010   Method used:   Electronically to        [facility]* (retail)       [address]       [address] Shipping/mailing       [place], [place]  [address]       Ph: [phone]       Fax: [phone]   RxID:   [id number] FUROSEMIDE 40 MG TABS (FUROSEMIDE) take 1 once daily  #90 x 3   Entered by:   Orlan Leavens RMA   Authorized by:   Newt Lukes MD   Signed by:   Orlan Leavens RMA on 05/28/2010   Method used:   Electronically to        [facility]* (retail)       [address]       [address] Shipping/mailing       [place], [place]  [address]       Ph: [phone]       Fax: [phone]   RxID:   [id number] LIPITOR 10 MG TABS (ATORVASTATIN CALCIUM) take 1 by mouth once daily  #90 x 3   Entered by:   Orlan Leavens RMA   Authorized by:   Newt Lukes MD   Signed by:   Orlan Leavens RMA on 05/28/2010   Method used:   Electronically to        [facility]* (retail)       [address]       [address] Shipping/mailing       [place], [place]  [address]       Ph: [phone]       Fax: [phone]   RxID:   [id number] BENICAR HCT 20-12.5 MG TABS (OLMESARTAN MEDOXOMIL-HCTZ) 1 by mouth once daily   #90 x 3   Entered by:   Orlan Leavens RMA   Authorized by:   Newt Lukes MD   Signed by:   Orlan Leavens RMA on 05/28/2010   Method used:   Electronically to        [facility]* (retail)       [address]       [address] Shipping/mailing       [place], [place]  [address]       Ph: [phone]       Fax: [phone]   RxID:   [id number] SYNTHROID 100 MCG  TABS (LEVOTHYROXINE SODIUM) take one by mouth once daily  #90 x 3   Entered by:   Orlan Leavens  RMA   Authorized by:   Newt Lukes MD   Signed by:   Orlan Leavens RMA on 05/28/2010   Method used:   Electronically to        [facility]* (retail)       [address]       [address] Shipping/mailing       [place], [place]  [address]       Ph: [phone]       Fax: [phone]   RxID:   [id number]

## 2010-07-23 NOTE — Letter (Signed)
Summary: [facility]   Imported By: Sherian Rein 06/26/2009 09:48:47  _____________________________________________________________________  External Attachment:    Type:   Image     Comment:   External Document

## 2010-07-23 NOTE — Letter (Signed)
Summary: Generic Letter  [facility]  [address] [place], [place] [address]   Phone: [phone]  Fax: [phone]    02/13/2010  [facility] [address] [place], [place]  [address]  Dear Ms. Hiltunen and [employer] Stay,    My pateint, [name], has been prescribed Tysabri for her MS and is scheduled for infusion today. I am aware she has been on recent prednisone for relapse MS symptoms as per her neurologist. It is ok to proceed with Tysabri infusion today.      Sincerely,    Rene Paci MD

## 2010-07-23 NOTE — Op Note (Signed)
Summary: Tysabri/[place]  Tysabri/[facility]   Imported By: Sherian Rein 01/18/2010 08:29:01  _____________________________________________________________________  External Attachment:    Type:   Image     Comment:   External Document

## 2010-07-23 NOTE — Discharge Summary (Signed)
Summary: Colitis  [name], [name]                ACCOUNT NO.:  [id number]      MEDICAL RECORD NO.:  [id number]          PATIENT TYPE:  INP      LOCATION:  1517                         FACILITY:  [facility]      PHYSICIAN:  Hind I Elsaid, MD      DATE OF BIRTH:  25-Jul-1955      DATE OF ADMISSION:  09/15/2009   DATE OF DISCHARGE:  09/16/2009                                  DISCHARGE SUMMARY      PRIMARY CARE PHYSICIAN:  [name], MD.      GASTROENTEROLOGY:  [name], MD,FACG      DISCHARGE DIAGNOSES:   1. Acute infectious colitis.   2. Chronic diarrhea.   3. Mild dehydration.   4. Diabetes mellitus type 2.   5. Hypertension.   6. History of multiple sclerosis.   7. Abnormal LFTs which resolved.      DISCHARGE MEDICATIONS:   1. Cipro 500 mg p.o. b.i.d. for 10 days.   2. Flagyl 500 mg p.o. q.8 hours for 10 days.   3. Benicar/HCT 20/12.5 one tablet.   4. Lomotil 1-2 tabs q.6 hours p.r.n.   5. Inderal 120 mg daily.   6. Clonazepam 0.5 mg q.8 hours.   7. Copaxone 20 mg injection daily.   8. Lasix 20 mg daily p.r.n.   9. Restasis both eyes one drop daily.   10.Provigil modafinil 200 mg daily.   11.Omeprazole 20 mg daily.   12.Multivitamin one tab daily.   13.Lovaza 1 gram p.o. b.i.d.   14.Lipitor 10 mg daily.   15.Ambien 1 mg p.o. daily.   16.Calcium carbonate 1200 mg p.o. daily.   17.Naproxen p.o. daily p.r.n.      CONSULTATIONS:  Dr. Elnoria Howard consulted covering for Dr. Leone Payor.      HISTORY OF PRESENT ILLNESS:  This is a 55 year old female status post   hemicolectomy for cecal tubovillous adenoma who has subsequent chronic   diarrhea fairly well-controlled with Lomotil who has been on a course of   antibiotic within the past 2 months for an ear infection who now   presents with severe and profuse watery diarrhea 2 days after returning   from [place].  The patient reported temperature of 102 and stools which   are much more loose and not controlled by Lomotil  but appeared to be   aggravated by eating.  In the emergency room the patient was found to   have temperature of 98.3 and blood pressure 114/75, pulse rate 75.   Abdomen examination was soft.  Blood workup was hemoglobin of 12.4,   white blood cells of 6, sodium 142 and BUN 10, creatinine of 8.1 to have   mildly elevated alkaline phosphatase of 152 and mild elevation of AST.   Lipase was normal.  A CT scan of the abdomen was done which showed   evidence of colitis involving the transverse and descending colon, no   associated obstruction or perforation.  The patient was admitted for   evaluation of colitis.  Dr. Elnoria Howard,  gastroenterology, was consulted and he   agreed with fluids started and to rule out C. difficile.  The patient   was placed on Cipro and Flagyl but the patient's symptoms completely   improved with current plan.  Even today the patient recommended to be   discharged home and Dr. Elnoria Howard agreed with discharge.  Accordingly the   patient will be discharged with Cipro and Flagyl p.o.  I do not have the   results of the C. difficile colitis test now but I took the patient's   number to call at [phone] to inform the results of the C.   difficile.  If the patient has positive C. difficile we will discontinue   the Cipro and continue with the Flagyl, in circumstances as we mentioned   if the patient has no more diarrhea, no more abdominal pain, no more   fever and also the patient's blood cultures are pending at this time.  I   have read the report and no growth to date.  The patient was advised to   follow up with Dr. Leone Payor within the next 1-2 weeks.               Hind Bosie Helper, MD            HIE/MEDQ  D:  09/16/2009  T:  09/16/2009  Job:  [id number]

## 2010-07-23 NOTE — Progress Notes (Signed)
Summary: Provigil PA  Phone Note Call from Patient   Caller: Patient Summary of Call: Pt is needing prior auth on med Provigil. Recieved fax from [facility] cone stating Quanity Limits; Limitation exceeded. Pls call [phone]. ID # [id number] Initial call taken by: Orlan Leavens,  December 25, 2009 2:43 PM  Follow-up for Phone Call        Will attempt to complete via cover my meds website, what dx should be used for this prescription? Follow-up by: Lucious Groves,  December 26, 2009 3:46 PM  Additional Follow-up for Phone Call Additional follow up Details #1::        MS - icd-9 340 Additional Follow-up by: Newt Lukes MD,  December 26, 2009 4:04 PM    Additional Follow-up for Phone Call Additional follow up Details #2::    Form completed and faxed, will await insurance company reply. Follow-up by: Lucious Groves,  December 27, 2009 1:53 PM   Appended Document: Provigil PA Checked on status via automated system, approved until 12-31-2010.

## 2010-07-23 NOTE — Progress Notes (Signed)
Summary: Rx refill req  Phone Note Refill Request Message from:  Fax from Pharmacy on May 28, 2010 2:06 PM  Refills Requested: Medication #1:  CLONAZEPAM 0.5 MG TABS take 1 three times a day as needed # 90   Last Refilled: 04/03/2010 [facility] Is this ok to refill?  Initial call taken by: Orlan Leavens RMA,  May 28, 2010 2:06 PM  Follow-up for Phone Call        yes - ok to fill as prev rx'd Follow-up by: Newt Lukes MD,  May 28, 2010 4:30 PM    Prescriptions: CLONAZEPAM 0.5 MG TABS (CLONAZEPAM) take 1 three times a day as needed  #90 x 1   Entered by:   Margaret Pyle, CMA   Authorized by:   Newt Lukes MD   Signed by:   Margaret Pyle, CMA on 05/28/2010   Method used:   Telephoned to ...       [facility]* (retail)       [address]       [address] Shipping/mailing       [place], [place]  [address]       Ph: [phone]       Fax: [phone]   RxID:   [id number]

## 2010-07-23 NOTE — Letter (Signed)
Summary: [facility]  Tysabri Order/[facility]   Imported By: Sherian Rein 01/01/2010 14:46:19  _____________________________________________________________________  External Attachment:    Type:   Image     Comment:   External Document

## 2010-07-23 NOTE — Medication Information (Signed)
Summary: Prior Auth/[facility]  Prior Auth/[facility]   Imported By: Lester Byron 01/02/2010 07:46:28  _____________________________________________________________________  External Attachment:    Type:   Image     Comment:   External Document

## 2010-07-23 NOTE — Progress Notes (Signed)
Summary: out work  Advice worker from Patient   Caller: Patient Reason for Call: Talk to Doctor Details for Reason: FYI Summary of Call: Pt want to inform md that her neurologist (Dr. Leotis Shames) took her out of work for a week due to MS relapse. Initial call taken by: Orlan Leavens RMA,  February 11, 2010 1:24 PM  Follow-up for Phone Call        noted - work note copied into EMR - Follow-up by: Newt Lukes MD,  February 11, 2010 3:36 PM

## 2010-07-23 NOTE — Progress Notes (Signed)
Summary: bodyaches  Phone Note Call from Patient   Caller: Patient ext [phone] Reason for Call: Talk to Doctor Summary of Call: Pt states she had her 1st Tysabri injection. Not feeling well this am. Having bodyaches, hands are shakey. Want to ask md waht she can take ovc. Normally take advil. Is this ok or do you recommend something else. Initial call taken by: Orlan Leavens RMA,  January 17, 2010 9:19 AM  Follow-up for Phone Call        rec both advil and tylenol: take advil 400mg  followed by tylenol 100mg  4 hours later, then advil 400mg  4 hours after that, etc - (alternate the 2 meds for optimal symptom relief) - thanks Follow-up by: Newt Lukes MD,  January 17, 2010 10:04 AM  Additional Follow-up for Phone Call Additional follow up Details #1::        Pt notified  Additional Follow-up by: Orlan Leavens RMA,  January 17, 2010 10:16 AM

## 2010-07-23 NOTE — Letter (Signed)
Summary: [facility]   Imported By: Sherian Rein 02/13/2010 12:21:55  _____________________________________________________________________  External Attachment:    Type:   Image     Comment:   External Document

## 2010-07-23 NOTE — Progress Notes (Signed)
Summary: Inderal La  Phone Note Call from Patient   Caller: Patient Reason for Call: Refill Medication, Talk to Nurse Summary of Call: Req 30 day supply on inderal La [facility] Initial call taken by: Orlan Leavens,  July 05, 2009 3:01 PM    Prescriptions: INDERAL LA 120 MG XR24H-CAP (PROPRANOLOL HCL) 1 cap once daily  #30 x 0   Entered by:   Orlan Leavens   Authorized by:   Newt Lukes MD   Signed by:   Orlan Leavens on 07/05/2009   Method used:   Electronically to        [facility] [address] [phone]* (retail)       [address] [place], [place]  [address]       Ph: [phone]       Fax: [phone]   RxID:   [id number]

## 2010-07-23 NOTE — Miscellaneous (Signed)
Summary: metro gel & diflucan  Medications Added METROGEL-VAGINAL 0.75 % GEL (METRONIDAZOLE) apply PV at bedtime x 5 days DIFLUCAN 150 MG TABS (FLUCONAZOLE) take 1 tab once daily repeat daily as needed       Clinical Lists Changes  Medications: Added new medication of METROGEL-VAGINAL 0.75 % GEL (METRONIDAZOLE) apply PV at bedtime x 5 days - Signed Added new medication of DIFLUCAN 150 MG TABS (FLUCONAZOLE) take 1 tab once daily repeat daily as needed - Signed Rx of METROGEL-VAGINAL 0.75 % GEL (METRONIDAZOLE) apply PV at bedtime x 5 days;  #1 tube x 0;  Signed;  Entered by: Orlan Leavens;  Authorized by: Newt Lukes MD;  Method used: Electronically to [facility] [address] [phone]*, [address] [place], [place], [place]  [address], Ph: [phone], Fax: [phone] Rx of DIFLUCAN 150 MG TABS (FLUCONAZOLE) take 1 tab once daily repeat daily as needed;  #3 x 0;  Signed;  Entered by: Orlan Leavens;  Authorized by: Newt Lukes MD;  Method used: Electronically to [facility] [address] [phone]*, [address] [place], [place], [place]  [address], Ph: [phone], Fax: [phone]    Prescriptions: DIFLUCAN 150 MG TABS (FLUCONAZOLE) take 1 tab once daily repeat daily as needed  #3 x 0   Entered by:   Orlan Leavens   Authorized by:   Newt Lukes MD   Signed by:   Orlan Leavens on 08/23/2009   Method used:   Electronically to        [facility] [address] [phone]* (retail)       [address] [place], [place]  [address]       Ph: [phone]       Fax: [phone]   RxID:   [id number] METROGEL-VAGINAL 0.75 % GEL (METRONIDAZOLE) apply PV at bedtime x 5 days  #1 tube x 0   Entered by:   Orlan Leavens   Authorized by:   Newt Lukes MD   Signed by:   Orlan Leavens on 08/23/2009   Method used:   Electronically to        [facility] [address] [phone]* (retail)       [address] [place], [place]  [address]       Ph: [phone]       Fax: [phone]   RxID:   [id number]

## 2010-07-23 NOTE — Progress Notes (Signed)
Summary: med refills/90 day  Phone Note Call from [name]   Caller: [name] Reason for Call: Refill Medication, Talk to Nurse Summary of Call: Requesting Allopurinol, Propranolol and Lovaza to be sent to [facility] pharmacy. Changing over from [facility]. Initial call taken by: Orlan Leavens,  September 26, 2009 12:37 PM    Prescriptions: INDERAL LA 120 MG XR24H-CAP (PROPRANOLOL HCL) 1 cap once daily  #90 x 3   Entered by:   Orlan Leavens   Authorized by:   Newt Lukes MD   Signed by:   Orlan Leavens on 09/26/2009   Method used:   Electronically to        [facility]* (retail)       [address]       [address] Shipping/mailing       [place], [place]  [address]       Ph: [phone]       Fax: [phone]   RxID:   [id number] ALLOPURINOL 100 MG TABS (ALLOPURINOL) 1 by mouth once daily  #90 x 3   Entered by:   Orlan Leavens   Authorized by:   Newt Lukes MD   Signed by:   Orlan Leavens on 09/26/2009   Method used:   Electronically to        [facility]* (retail)       [address]       [address] Shipping/mailing       [place], [place]  [address]       Ph: [phone]       Fax: [phone]   RxID:   [id number] LOVAZA 1 GM CAPS (OMEGA-3-ACID ETHYL ESTERS) Take 2 tablets two times a day  #360 x 3   Entered by:   Orlan Leavens   Authorized by:   Newt Lukes MD   Signed by:   Orlan Leavens on 09/26/2009   Method used:   Electronically to        [facility]* (retail)       [address]       [address] Shipping/mailing       [place], [place]  [address]       Ph: [phone]       Fax: [phone]   RxID:   [id number]

## 2010-07-23 NOTE — Medication Information (Signed)
Summary: Tysabri/[facility]  Tysabri[facility]   Imported By: Sherian Rein 12/12/2009 11:28:51  _____________________________________________________________________  External Attachment:    Type:   Image     Comment:   External Document

## 2010-07-23 NOTE — Miscellaneous (Signed)
Summary: neuro refer   Clinical Lists Changes  Orders: Added new Referral order of Neurology Referral (Neuro) - Signed

## 2010-07-23 NOTE — Progress Notes (Signed)
Summary: REQ FOR ORDERS -Tysabri  Phone Note From Other Clinic   Caller: Case Manager -[facility] program Summary of Call: Patient needs orders to short stay for Tysabri treatment. Please include medication order, office note (if required), pre-meds etc with order.  Initial call taken by: Lamar Sprinkles, CMA,  December 28, 2009 12:29 PM  Follow-up for Phone Call        please check with [facility] short stay: do they have preprinted oreders for tysabri per protocol? if not, i will need short stay order form on my desk to complete - thanks Follow-up by: Newt Lukes MD,  December 28, 2009 12:41 PM  Additional Follow-up for Phone Call Additional follow up Details #1::        Spoke with Touch program. They do not have preprinted order forms. Please add to order *please fill out pre-infusion paperwork. Spoke w/short stay, they are sending sample order form and blank forms. Each order will be good x 3 mths.  Additional Follow-up by: Lamar Sprinkles, CMA,  December 28, 2009 2:58 PM    Additional Follow-up for Phone Call Additional follow up Details #2::    Forms recieved and avail for review............Marland KitchenLamar Sprinkles, CMA  December 28, 2009 3:54 PM  returned to [facility] on 7/8 afternoon; anything next..?.Newt Lukes MD  December 31, 2009 8:50 AM   Pt had original rx of order from Neuro, confirmed orders were same. Completed form faxed to short stay, pt informed and she may arrange apt at short stay at her convenience. New orders are to be faxed to short stay every 3 mths. Follow-up by: Lamar Sprinkles, CMA,  December 31, 2009 9:14 AM

## 2010-07-25 NOTE — Progress Notes (Signed)
Summary: tysabri infusion orders  Phone Note From Other Clinic   Caller: [facility] short stay Call For: Dr. Felicity Coyer Summary of Call: MD recieved fax from [facility]  long short stay. Needing new order for Tysabri injection for 07/09/10. Completed order faxing back to short stay @ [phone] Initial call taken by: Orlan Leavens RMA,  July 08, 2010 8:46 AM  Follow-up for Phone Call        signed - thx Follow-up by: Newt Lukes MD,  July 08, 2010 8:56 AM

## 2010-07-25 NOTE — Op Note (Signed)
Summary: Infusion Order/[facility] [place] [facility]  Infusion Order/[place] Short Stay   Imported By: Lester [place] 07/10/2010 08:28:14  _____________________________________________________________________  External Attachment:    Type:   Image     Comment:   External Document

## 2010-07-25 NOTE — Progress Notes (Signed)
Summary: nuc pre-procedure  Phone Note Outgoing Call   Call placed by: Domenic Polite, CNMT,  June 10, 2010 10:48 AM Call placed to: Patient Reason for Call: Confirm/change Appt Summary of Call: Left message with information on Myoview Information Sheet (see scanned document for details).      Nuclear Med Background Indications for Stress Test: Evaluation for Ischemia   History: Echo  History Comments: Echo EF=60-65%  Symptoms: Chest Pain, DOE    Nuclear Pre-Procedure Cardiac Risk Factors: Hypertension, Lipids, NIDDM Height (in): 65

## 2010-07-25 NOTE — Progress Notes (Signed)
Summary: REQ FOR RX  Phone Note Call from Patient   Summary of Call: Patient is requesting rx for vaginitis which is side effect of Tysabri. Please advise.  Initial call taken by: Lamar Sprinkles, CMA,  July 18, 2010 11:18 AM  Follow-up for Phone Call        metrogel as before - rx done in emr but not sent until you can verify approp pharmacy - thanks Follow-up by: Newt Lukes MD,  July 18, 2010 12:12 PM  Additional Follow-up for Phone Call Additional follow up Details #1::        Pt informed  Additional Follow-up by: Lamar Sprinkles, CMA,  July 18, 2010 12:39 PM    New/Updated Medications: METROGEL-VAGINAL 0.75 % GEL (METRONIDAZOLE) apply PV at bedtime x 5 nights Prescriptions: METROGEL-VAGINAL 0.75 % GEL (METRONIDAZOLE) apply PV at bedtime x 5 nights  #1 tube x 1   Entered by:   Lamar Sprinkles, CMA   Authorized by:   Newt Lukes MD   Signed by:   Lamar Sprinkles, CMA on 07/18/2010   Method used:   Electronically to        [facility] [address] [phone]* (retail)       [address] [place], [place]  [address]       Ph: [phone]       Fax: [phone]   RxID:   [id number] METROGEL-VAGINAL 0.75 % GEL (METRONIDAZOLE) apply PV at bedtime x 5 nights  #1 tube x 1   Entered and Authorized by:   Newt Lukes MD   Signed by:   Newt Lukes MD on 07/18/2010   Method used:   Historical   RxID:   [id number]

## 2010-07-25 NOTE — Assessment & Plan Note (Signed)
Summary: f/u echo/myoview/sl      Allergies Added: NKDA  Visit Type:  3 wk f/u Primary Provider:  Newt Lukes MD  CC:  pt denies any cardiac complaints today.  History of Present Illness: [name] returns today for evaluation and management chest discomfort. She is not having it at the present time. It occurs at rest and generally hit her in the center of the chest and goes around her left side. She's had negative chest x-ray, stress nuclear study that shows no ischemia with an ejection fraction of 77%, and a 2-D echocardiogram is stable from 2009. Specifically, she has normal left ventricular function, mild mitral regurgitation, mild left atrial enlargement, and a pulmonary artery pressure 35 mm.  She's had a history of pulmonary emboli but this does not sound like a recurrence of that. She denies any dyspnea on exertion, hemoptysis, has not been tachycardic. She denies any localized swelling or injury to her legs.  Her biggest problem is the progression of her multiple sclerosis. She is seeking disability for this.  Current Medications (verified): 1)  Synthroid 100 Mcg  Tabs (Levothyroxine Sodium) .... Take One By Mouth Once Daily 2)  Inderal La 120 Mg Xr24h-Cap (Propranolol Hcl) .Marland Kitchen.. 1 Cap Once Daily 3)  Prilosec 20 Mg  Cpdr (Omeprazole) .... Take One Tab By Mouth Once Daily 4)  Benicar Hct 20-12.5 Mg Tabs (Olmesartan Medoxomil-Hctz) .Marland Kitchen.. 1 By Mouth Once Daily 5)  Provigil 200 Mg  Tabs (Modafinil) .... Take One Tab By Mouth Two Times A Day As Needed 6)  Multivitamins  Tabs (Multiple Vitamin) .Marland Kitchen.. 1 By Mouth Once Daily 7)  Cvs Calcium-600 600 Mg Tabs (Calcium Carbonate) .Marland Kitchen.. 1 By Mouth Qd 8)  Lomotil 2.5-0.025 Mg  Tabs (Diphenoxylate-Atropine) .Marland Kitchen.. 1-2 By Mouth Q 6 Hours As Needed Diarrhea 9)  Lovaza 1 Gm Caps (Omega-3-Acid Ethyl Esters) .... Take 2 Tablets Two Times A Day 10)  Lipitor 10 Mg Tabs (Atorvastatin Calcium) .... Take 1 By Mouth Once Daily 11)  Freestyle Lite Test  Strp  (Glucose Blood) .... Check Blood Sugar Two Times A Day 12)  Freestyle Lancets  Misc (Lancets) .... Use As Directed 13)  Furosemide 40 Mg Tabs (Furosemide) .... Take 1 Once Daily 14)  Zovirax 400 Mg Tabs (Acyclovir) .... Take 1 Three Times A Day For 5 Days As Needed 15)  Allopurinol 100 Mg Tabs (Allopurinol) .Marland Kitchen.. 1 By Mouth Once Daily 16)  Clonazepam 0.5 Mg Tabs (Clonazepam) .... Take 1 Three Times A Day As Needed 17)  Glimepiride 1 Mg Tabs (Glimepiride) .Marland Kitchen.. 1 By Mouth Every Morning 18)  Tysabri 300 Mg/41ml Conc (Natalizumab) .... Infusion Every 30days 19)  Aspirin 81 Mg Tbec (Aspirin) .Marland Kitchen.. 1 By Mouth Once Daily  Allergies (verified): No Known Drug Allergies  Past History:  Past Medical History: Last updated: 05/27/2010 Hypertension Diabetes mellitus type 2 Multiple Sclerosis  Anxiety Pulmonary Embolus/DVT - postsurg - 11/2006 Hypothyroidism GERD  physician roster - GI - [facility] neuro - [facility] cards - [facility] - tavvon  Past Surgical History: Last updated: 02/16/2009 Laparoscopic cecal polyp resection 6/08 (Right hemicolectomy) Ex lap and ileostomy/Hartmann's pouch for anastamotic leak 6/08 Ileostomy reversal 11/08 Cholecystectomy Cesarean section x 2 Abdominoplasty Total thyroidectomy - 1990  Family History: Last updated: 02/16/2009 mom - A&W - hx breast cancer dad - died - stomach cancer age 10 both GMs - DM one sis - A&W  Social History: Last updated: 05/21/2010 on medical leave since 04/2010 Married - lives with spouse Patient  has never smoked.  Alcohol Use - no Daily Caffeine Use  Risk Factors: Alcohol Use: 0 (05/21/2010) Exercise: yes (05/21/2010)  Risk Factors: Smoking Status: never (05/21/2010)  Vital Signs:  Patient profile:   55 year old female Height:      65 inches Weight:      226.50 pounds BMI:     37.83 Pulse rate:   68 / minute Pulse rhythm:   regular BP sitting:   136 / 70  (left arm) Cuff size:   large  Vitals Entered By:  Danielle Rankin, CMA (June 19, 2010 4:26 PM)  Physical Exam  General:  obese.  no acute distress Head:  normocephalic and atraumatic Eyes:  PERRLA/EOM intact; conjunctiva and lids normal. Neck:  Neck supple, no JVD. No masses, thyromegaly or abnormal cervical nodes. Lungs:  clear to auscultation percussion without rubs Heart:  regular rate and rhythm, normal S1-S2, no carotid bruits Msk:  Back normal, normal gait. Muscle strength and tone normal. Pulses:  pulses normal in all 4 extremities Extremities:  No clubbing or cyanosis. Neurologic:  Alert and oriented x 3. Skin:  Intact without lesions or rashes. Psych:  Normal affect.   Impression & Recommendations:  Problem # 1:  CHEST PAIN (ICD-786.50) Assessment Improved I have reassured her that this is not cardiacor pulmonary. I would advise she gets some liquid antacid to take p.r.n. and also to take her Prilosec p.r.n. for possible reflux and esophageal spasm. Her updated medication list for this problem includes:    Inderal La 120 Mg Xr24h-cap (Propranolol hcl) .Marland Kitchen... 1 cap once daily    Aspirin 81 Mg Tbec (Aspirin) .Marland Kitchen... 1 by mouth once daily  Patient Instructions: 1)  Your physician recommends that you schedule a follow-up appointment  as needed with Dr. Daleen Squibb 2)  Your physician recommends that you continue on your current medications as directed. Please refer to the Current Medication list given to you today.

## 2010-07-25 NOTE — Letter (Signed)
Summary: [facility]  [facility]   Imported By: Lester [place] 07/10/2010 07:59:19  _____________________________________________________________________  External Attachment:    Type:   Image     Comment:   External Document

## 2010-07-25 NOTE — Assessment & Plan Note (Signed)
Summary: Cardiology Nuclear Testing  Nuclear Med Background Indications for Stress Test: Evaluation for Ischemia   History: Echo, GXT  History Comments: 06/11/10 Echo:EF=60-65%, mild MR  Symptoms: Chest Pressure, Chest Tightness, Palpitations, SOB  Symptoms Comments: CP>jaw and (R) arm. Last episode of CP:one week ago.   Nuclear Pre-Procedure Cardiac Risk Factors: Hypertension, Lipids, NIDDM, Obesity Caffeine/Decaff Intake: none NPO After: 7:00 PM Lungs: Clear. IV 0.9% NS with Angio Cath: 22g     IV Site: L Forearm IV Started by: Cathlyn Parsons, RN Chest Size (in) 36     Cup Size B     Height (in): 65 Weight (lb): 229 BMI: 38.25 Tech Comments: Inderal held x 24 hours.  No meds. taken this a.m.  Nuclear Med Study 1 or 2 day study:  1 day     Stress Test Type:  Stress Reading MD:  Cassell Clement, MD     Referring MD:  Valera Castle, MD Resting Radionuclide:  Technetium 70m Tetrofosmin     Resting Radionuclide Dose:  11 mCi  Stress Radionuclide:  Technetium 40m Tetrofosmin     Stress Radionuclide Dose:  33 mCi   Stress Protocol Exercise Time (min):  8:00 min     Max HR:  151 bpm     Predicted Max HR:  165 bpm  Max Systolic BP: 192 mm Hg     Percent Max HR:  91.52 %     METS: 10.1 Rate Pressure Product:  11914    Stress Test Technologist:  Rea College, CMA-N     Nuclear Technologist:  Doyne Keel, CNMT  Rest Procedure  Myocardial perfusion imaging was performed at rest 45 minutes following the intravenous administration of Technetium 75m Tetrofosmin.  Stress Procedure  The patient exercised for eight minutes.  The patient stopped due to fatigue and denied any chest pain.  There were no diagnostic ST-T wave changes.  Technetium 7m Tetrofosmin was injected at peak exercise and myocardial perfusion imaging was performed after a brief delay.  QPS Raw Data Images:  Normal; no motion artifact; normal heart/lung ratio. Stress Images:  Normal homogeneous uptake in all  areas of the myocardium. Rest Images:  Normal homogeneous uptake in all areas of the myocardium. Subtraction (SDS):  No evidence of ischemia. Transient Ischemic Dilatation:  .94  (Normal <1.22)  Lung/Heart Ratio:  .32  (Normal <0.45)  Quantitative Gated Spect Images QGS EDV:  77 ml QGS ESV:  18 ml QGS EF:  77 % QGS cine images:  No wall motion abnormalities  Findings Normal nuclear study      Overall Impression  Exercise Capacity: Good exercise capacity. BP Response: Normal blood pressure response. Clinical Symptoms: No chest pain ECG Impression: Insignificant upsloping ST segment depression. Overall Impression: Normal stress nuclear study.  Appended Document: Cardiology Nuclear Testing normal!!!  Appended Document: Cardiology Nuclear Testing Pt aware of results. Mylo Red RN

## 2010-07-25 NOTE — Letter (Signed)
Summary: [facility]   Imported By: Lester [place] 06/27/2010 10:47:04  _____________________________________________________________________  External Attachment:    Type:   Image     Comment:   External Document

## 2010-07-25 NOTE — Op Note (Signed)
Summary: Appt cancelled/[facility]  Appt cancelled/[facility]   Imported By: Sherian Rein 07/15/2010 09:45:01  _____________________________________________________________________  External Attachment:    Type:   Image     Comment:   External Document

## 2010-07-25 NOTE — Progress Notes (Signed)
Summary: Provigil  Phone Note Refill Request Message from:  Fax from Pharmacy on July 01, 2010 9:35 AM  Refills Requested: Medication #1:  PROVIGIL 200 MG  TABS take one tab by mouth two times a day as needed # 60   Last Refilled: 05/28/2010 [facility] Last ov 05/27/10 Is this ok to refill?   Method Requested: Fax to Local Pharmacy Next Appointment Scheduled: none Initial call taken by: Orlan Leavens RMA,  July 01, 2010 9:36 AM  Follow-up for Phone Call        yes ok to fill as requested -  Follow-up by: Newt Lukes MD,  July 01, 2010 9:37 AM    Prescriptions: PROVIGIL 200 MG  TABS (MODAFINIL) take one tab by mouth two times a day as needed  #60 x 1   Entered by:   Orlan Leavens RMA   Authorized by:   Newt Lukes MD   Signed by:   Orlan Leavens RMA on 07/01/2010   Method used:   Printed then faxed to ...       [facility]* (retail)       [address]       [address] Shipping/mailing       [place], [place]  [address]       Ph: [phone]       Fax: [phone]   RxID:   [id number]  Faxed script to [facility]...07/01/10@10 :45am/LMB

## 2010-07-25 NOTE — Medication Information (Signed)
Summary: [facility]  [facility]   Imported By: Lester [place] 06/27/2010 10:32:31  _____________________________________________________________________  External Attachment:    Type:   Image     Comment:   External Document

## 2010-07-25 NOTE — Progress Notes (Signed)
Summary: VAL PT/Please advise  Phone Note Call from Patient   Summary of Call: Dr Felicity Coyer is out, Please advise -  Please tell Vikki Ports I am having a MS relapse and they have put me on prednisone. I hate prednisone. My  BS readings have been high 200s up and down.  When they get to 280 can I take 2 pills for that not counting the normal am pill?  Initial call taken by: Lamar Sprinkles, CMA,  July 19, 2010 11:33 AM  Follow-up for Phone Call        for Blood sugars running greater than 200 consistently may increase glimeperide to 2 mg daily.  Follow-up by: Jacques Navy MD,  July 19, 2010 1:14 PM  Additional Follow-up for Phone Call Additional follow up Details #1::        Pt informed  Additional Follow-up by: Lamar Sprinkles, CMA,  July 19, 2010 2:45 PM

## 2010-07-25 NOTE — Medication Information (Signed)
Summary: [name]   Imported By: Lester [place] 07/10/2010 08:03:15  _____________________________________________________________________  External Attachment:    Type:   Image     Comment:   External Document

## 2010-07-25 NOTE — Progress Notes (Signed)
Summary: dm supplies via [facility]  Phone Note From Other Clinic   [facility]er: [email or web address] Summary of Call: MD recieved fax from [facility] requesting a rx for Truetrack Glucometer, and test strips, lancets and alcohol pads to be sent to [facility] pharmacy. Per MD ok to send Initial call taken by: Orlan Leavens RMA,  July 08, 2010 8:37 AM  Follow-up for Phone Call        yes - ok to send - thanks Follow-up by: Newt Lukes MD,  July 08, 2010 8:55 AM    New/Updated Medications: TRUERESULT BLOOD GLUCOSE W/DEVICE KIT (BLOOD GLUCOSE MONITORING SUPPL) use as directed TRUETEST TEST  STRP (GLUCOSE BLOOD) use two times a day check blood sugars TRUEPLUS LANCETS 28G  MISC (LANCETS) use as directed ALCOHOL PREPS  PADS (ALCOHOL SWABS) use as directed Prescriptions: ALCOHOL PREPS  PADS (ALCOHOL SWABS) use as directed  #180 x 1   Entered by:   Orlan Leavens RMA   Authorized by:   Newt Lukes MD   Signed by:   Newt Lukes MD on 07/08/2010   Method used:   Electronically to        [facility]* (retail)       [address]       [address] Shipping/mailing       [place], [place]  [address]       Ph: [phone]       Fax: [phone]   RxID:   [id number] TRUEPLUS LANCETS 28G  MISC (LANCETS) use as directed  #180 x 1   Entered by:   Orlan Leavens RMA   Authorized by:   Newt Lukes MD   Signed by:   Newt Lukes MD on 07/08/2010   Method used:   Electronically to        [facility]* (retail)       [address]       [address] Shipping/mailing       [place], [place]  [address]       Ph: [phone]       Fax: [phone]   RxID:   [id number] TRUETEST TEST  STRP (GLUCOSE BLOOD) use two times a day check blood sugars  #180 x 1   Entered by:   Orlan Leavens RMA   Authorized by:   Newt Lukes MD   Signed by:   Newt Lukes MD on 07/08/2010   Method used:   Electronically to        [facility]* (retail)       [address]       [address] Shipping/mailing       [place], [place]  [address]       Ph: [phone]       Fax: [phone]   RxID:   [id number] TRUERESULT BLOOD GLUCOSE W/DEVICE KIT (BLOOD GLUCOSE MONITORING SUPPL) use as directed  #1 x 0   Entered by:   Orlan Leavens RMA   Authorized by:   Newt Lukes MD   Signed by:   Newt Lukes MD on 07/08/2010   Method used:   Electronically to        [facility]* (retail)       [address]       [address] Shipping/mailing       [place], [place]  [address]  Ph: [phone]       Fax: [phone]   RxID:   [id number]

## 2010-07-25 NOTE — Medication Information (Signed)
Summary: Approved/[facility]  Prescribing Program  Approved/[facility]  Prescribing Program   Imported By: Lester [place] 06/28/2010 10:30:19  _____________________________________________________________________  External Attachment:    Type:   Image     Comment:   External Document

## 2010-07-29 ENCOUNTER — Telehealth: Payer: Self-pay | Admitting: Internal Medicine

## 2010-07-30 ENCOUNTER — Telehealth (INDEPENDENT_AMBULATORY_CARE_PROVIDER_SITE_OTHER): Payer: Self-pay | Admitting: *Deleted

## 2010-07-31 ENCOUNTER — Other Ambulatory Visit: Payer: Self-pay | Admitting: Internal Medicine

## 2010-07-31 ENCOUNTER — Encounter (INDEPENDENT_AMBULATORY_CARE_PROVIDER_SITE_OTHER): Payer: Self-pay | Admitting: *Deleted

## 2010-07-31 ENCOUNTER — Telehealth: Payer: Self-pay | Admitting: Internal Medicine

## 2010-07-31 ENCOUNTER — Other Ambulatory Visit: Payer: Commercial Managed Care - PPO

## 2010-07-31 DIAGNOSIS — E119 Type 2 diabetes mellitus without complications: Secondary | ICD-10-CM

## 2010-07-31 DIAGNOSIS — R7989 Other specified abnormal findings of blood chemistry: Secondary | ICD-10-CM

## 2010-07-31 DIAGNOSIS — G35 Multiple sclerosis: Secondary | ICD-10-CM

## 2010-07-31 LAB — BASIC METABOLIC PANEL
CO2: 32 mEq/L (ref 19–32)
Calcium: 9.8 mg/dL (ref 8.4–10.5)
Chloride: 100 mEq/L (ref 96–112)
Potassium: 3.9 mEq/L (ref 3.5–5.1)
Sodium: 140 mEq/L (ref 135–145)

## 2010-07-31 LAB — HEPATIC FUNCTION PANEL
ALT: 95 U/L — ABNORMAL HIGH (ref 0–35)
AST: 64 U/L — ABNORMAL HIGH (ref 0–37)
Alkaline Phosphatase: 104 U/L (ref 39–117)
Bilirubin, Direct: 0.1 mg/dL (ref 0.0–0.3)
Total Protein: 7.2 g/dL (ref 6.0–8.3)

## 2010-07-31 NOTE — Medication Information (Signed)
Summary: Glimepiride / [facility]  Glimepiride / [facility]   Imported By: Lennie Odor 07/22/2010 15:09:59  _____________________________________________________________________  External Attachment:    Type:   Image     Comment:   External Document

## 2010-07-31 NOTE — Letter (Signed)
Summary: [facility]  [facility]   Imported By: Sherian Rein 07/24/2010 12:28:22  _____________________________________________________________________  External Attachment:    Type:   Image     Comment:   External Document

## 2010-08-01 ENCOUNTER — Telehealth: Payer: Self-pay | Admitting: Internal Medicine

## 2010-08-01 DIAGNOSIS — K760 Fatty (change of) liver, not elsewhere classified: Secondary | ICD-10-CM | POA: Insufficient documentation

## 2010-08-01 HISTORY — DX: Fatty (change of) liver, not elsewhere classified: K76.0

## 2010-08-07 ENCOUNTER — Ambulatory Visit: Payer: Self-pay

## 2010-08-08 ENCOUNTER — Telehealth: Payer: Self-pay | Admitting: Internal Medicine

## 2010-08-08 NOTE — Progress Notes (Signed)
Summary: abn elev LFTs - med? and gi eval  Phone Note Call from Patient Call back at Home Phone [phone] [facility]     Summary of Call: pt concerned with elev LFTs - stable elevation on my review of prior labs 05/21/10 (67/87) 09/14/09 (42/51) 04/26/07 (85/128) and 02/18/07 (60/80); known fatty liver on CT a/p 09/15/09 -- will hold statin at this time (on med >8mo) and refer for GI eval and opinion on same - Initial call taken by: Newt Lukes MD,  August 01, 2010 8:42 AM  New Problems: TRANSAMINASES, SERUM, ELEVATED (ICD-790.4)   New Problems: TRANSAMINASES, SERUM, ELEVATED (ICD-790.4) New/Updated Medications: LIPITOR 10 MG TABS (ATORVASTATIN CALCIUM) HOLD 08/01/09

## 2010-08-08 NOTE — Progress Notes (Signed)
  Phone Note Other Incoming   Request: Send information Summary of Call: Request for records received from [facility]. Request forwarded to [facility].

## 2010-08-08 NOTE — Progress Notes (Signed)
Summary: increase glimipiride  Phone Note Call from Patient   Caller: Patient sent e-mail Call For: Newt Lukes MD Complaint: Headache Summary of Call: Pt sent e-mail stating been working with diabetic counselors which has been good. My BS am readings are high (around 160-170), and also they were very high for 12 days while on prednisone. I am almost out of glipiride since i have been taking more than 1mg  per day during this time. Could Vikki Ports call in 2 mg pills rather than the 1mg ? I can break in in half if I need less. Normally by noon they are back to 120 or less. But I need more in am. [facility]. Pls advise Initial call taken by: Orlan Leavens RMA,  July 31, 2010 8:35 AM  Follow-up for Phone Call        med changed as requested - see erx Follow-up by: Newt Lukes MD,  July 31, 2010 9:11 AM  Additional Follow-up for Phone Call Additional follow up Details #1::        Notified pt via e-mail with md reponse Additional Follow-up by: Orlan Leavens RMA,  July 31, 2010 9:24 AM    New/Updated Medications: GLIMEPIRIDE 2 MG TABS (GLIMEPIRIDE) 1/2-1 by mouth  every morning as directed Prescriptions: GLIMEPIRIDE 2 MG TABS (GLIMEPIRIDE) 1/2-1 by mouth  every morning as directed  #30 x 3   Entered and Authorized by:   Newt Lukes MD   Signed by:   Newt Lukes MD on 07/31/2010   Method used:   Electronically to        [facility]* (retail)       [address]       [address] Shipping/mailing       [place], [place]  [address]       Ph: [phone]       Fax: [phone]   RxID:   [id number]

## 2010-08-08 NOTE — Progress Notes (Signed)
Summary: labs  Phone Note Outgoing Call   Call placed by: Orlan Leavens RMA,  July 29, 2010 9:25 AM Call placed to: Patient Summary of Call: MD recieved order from Dr. Leotis Shames requesting pt to have bmet & LFT check here. MD ok for labs to be done. Will notify pt, and also put orders in EPIC. Bmet & LFT dx 790.6, 340, 250.00.Marland KitchenMarland Kitchen2/6/12@9 :27am Initial call taken by: Orlan Leavens RMA,  July 29, 2010 9:27 AM

## 2010-08-09 ENCOUNTER — Encounter: Payer: Self-pay | Admitting: Internal Medicine

## 2010-08-09 ENCOUNTER — Encounter (INDEPENDENT_AMBULATORY_CARE_PROVIDER_SITE_OTHER): Payer: Self-pay | Admitting: *Deleted

## 2010-08-09 ENCOUNTER — Other Ambulatory Visit: Payer: Commercial Managed Care - PPO

## 2010-08-09 ENCOUNTER — Other Ambulatory Visit: Payer: Self-pay | Admitting: Internal Medicine

## 2010-08-09 ENCOUNTER — Ambulatory Visit (INDEPENDENT_AMBULATORY_CARE_PROVIDER_SITE_OTHER): Payer: Commercial Managed Care - PPO | Admitting: Internal Medicine

## 2010-08-09 DIAGNOSIS — K589 Irritable bowel syndrome without diarrhea: Secondary | ICD-10-CM | POA: Insufficient documentation

## 2010-08-09 DIAGNOSIS — R7402 Elevation of levels of lactic acid dehydrogenase (LDH): Secondary | ICD-10-CM

## 2010-08-09 DIAGNOSIS — R197 Diarrhea, unspecified: Secondary | ICD-10-CM

## 2010-08-09 DIAGNOSIS — R740 Nonspecific elevation of levels of transaminase and lactic acid dehydrogenase [LDH]: Secondary | ICD-10-CM

## 2010-08-09 LAB — CONVERTED CEMR LAB
Basophils Absolute: 0 10*3/uL (ref 0.0–0.1)
Eosinophils Absolute: 0.1 10*3/uL (ref 0.0–0.7)
Eosinophils Relative: 2 % (ref 0–5)
HCT: 39.9 % (ref 36.0–46.0)
Lymphocytes Relative: 35 % (ref 12–46)
Lymphs Abs: 2.4 10*3/uL (ref 0.7–4.0)
MCV: 89.5 fL (ref 78.0–100.0)
Neutrophils Relative %: 56 % (ref 43–77)
Platelets: 287 10*3/uL (ref 150–400)
RDW: 14.1 % (ref 11.5–15.5)
WBC: 6.9 10*3/uL (ref 4.0–10.5)

## 2010-08-09 LAB — TSH: TSH: 2.77 u[IU]/mL (ref 0.35–5.50)

## 2010-08-09 LAB — HEPATIC FUNCTION PANEL
Albumin: 4 g/dL (ref 3.5–5.2)
Alkaline Phosphatase: 111 U/L (ref 39–117)
Total Protein: 7.1 g/dL (ref 6.0–8.3)

## 2010-08-11 ENCOUNTER — Telehealth: Payer: Self-pay | Admitting: Internal Medicine

## 2010-08-12 ENCOUNTER — Encounter: Payer: Self-pay | Admitting: Internal Medicine

## 2010-08-13 ENCOUNTER — Encounter: Payer: Self-pay | Admitting: Internal Medicine

## 2010-08-13 ENCOUNTER — Encounter (INDEPENDENT_AMBULATORY_CARE_PROVIDER_SITE_OTHER): Payer: Self-pay | Admitting: *Deleted

## 2010-08-13 ENCOUNTER — Other Ambulatory Visit: Payer: Commercial Managed Care - PPO

## 2010-08-13 ENCOUNTER — Other Ambulatory Visit: Payer: Self-pay | Admitting: Internal Medicine

## 2010-08-13 DIAGNOSIS — R740 Nonspecific elevation of levels of transaminase and lactic acid dehydrogenase [LDH]: Secondary | ICD-10-CM

## 2010-08-13 LAB — CONVERTED CEMR LAB
Anti Nuclear Antibody(ANA): NEGATIVE
HCV Ab: NEGATIVE
Hepatitis B Surface Ag: NEGATIVE

## 2010-08-13 LAB — FERRITIN: Ferritin: 89.7 ng/mL (ref 10.0–291.0)

## 2010-08-14 NOTE — Assessment & Plan Note (Addendum)
Summary: abd pain     History of Present Illness Visit Type: Follow-up Visit Primary GI MD: Stan Head MD [facility] Primary Provider: Newt Lukes MD Requesting Provider: na Chief Complaint: Severe diarrhea  History of Present Illness:   55 yo ww s/p right hemicolectomy for large adenomatous polyp in the past. Has had chronic and recurrent diarrhea issues. Has been having flares of her multiple sclerosis manifested by cognitive changes and diplopia. Not responding to Copaxone and then Tysbari. Warm sensation in RLQ noticed about a week ago. She noticed swelling on the left abdomen. LLQ pain. She had been away due to her father-in-laws death. She had "brick red" stools ("not blood" x 3 days  for a while and now they are yellow-green and loose for last 1-2 days - watery. Over the past year her diarrhea had been much beter and little Lomotil over time. Her symptoms started (stool change) when she left for [place] on 2/9. She has used Lomotil sparingly - took one yesterday and no effect. About 10 stools in 24 hours,small and watery. Not during sleep though awakened at 5AM today to defecate. No incontinencebut rapd urge.  Bitten by a ca t ast week and started augmentin. GI signs sxs began before that. No fever. Off Tsybari since december - LFT's are up some. Stopped due to relapses while on it and so she stopped it and Dr. Lin Givens agreed. Due to see him soon.  Prednisone high-dose at end of January for a few days but stopped early. No antibiotics before the cat bite.   GI Review of Systems    Reports abdominal pain.     Location of  Abdominal pain: lower abdomen.    Denies acid reflux, belching, bloating, chest pain, dysphagia with liquids, dysphagia with solids, heartburn, loss of appetite, nausea, vomiting, vomiting blood, weight loss, and  weight gain.      Reports change in bowel habits and  diarrhea.     Denies anal fissure, black tarry stools, constipation, diverticulosis,  fecal incontinence, heme positive stool, hemorrhoids, irritable bowel syndrome, jaundice, light color stool, liver problems, rectal bleeding, and  rectal pain.    Current Medications (verified): 1)  Synthroid 100 Mcg  Tabs (Levothyroxine Sodium) .... Take One By Mouth Once Daily 2)  Inderal La 120 Mg Xr24h-Cap (Propranolol Hcl) .Marland Kitchen.. 1 Cap Once Daily 3)  Prilosec 20 Mg  Cpdr (Omeprazole) .... Take One Tab By Mouth Once Daily 4)  Benicar Hct 20-12.5 Mg Tabs (Olmesartan Medoxomil-Hctz) .Marland Kitchen.. 1 By Mouth Once Daily 5)  Provigil 200 Mg  Tabs (Modafinil) .... Take One Tab By Mouth Two Times A Day As Needed 6)  Multivitamins  Tabs (Multiple Vitamin) .Marland Kitchen.. 1 By Mouth Once Daily 7)  Cvs Calcium-600 600 Mg Tabs (Calcium Carbonate) .Marland Kitchen.. 1 By Mouth Qd 8)  Lomotil 2.5-0.025 Mg  Tabs (Diphenoxylate-Atropine) .... As Needed 9)  Lovaza 1 Gm Caps (Omega-3-Acid Ethyl Esters) .... Take 2 Tablets Two Times A Day 10)  Lipitor 10 Mg Tabs (Atorvastatin Calcium) .... Hold 08/01/09 11)  Truetrack Test  Strp (Glucose Blood) .... As Directed 12)  Truetrack Test  Strp (Glucose Blood) .... As Directed 13)  Furosemide 40 Mg Tabs (Furosemide) .... Take 1 Once Daily 14)  Zovirax 400 Mg Tabs (Acyclovir) .... Take 1 Three Times A Day For 5 Days As Needed 15)  Allopurinol 100 Mg Tabs (Allopurinol) .Marland Kitchen.. 1 By Mouth Once Daily 16)  Clonazepam 0.5 Mg Tabs (Clonazepam) .... Take 1 Three Times A Day  As Needed 17)  Glimepiride 2 Mg Tabs (Glimepiride) .... 1/2-1 By Mouth  Every Morning As Directed 18)  Tysabri 300 Mg/71ml Conc (Natalizumab) .... Infusion Every 30days( On Hold) 19)  Aspirin 81 Mg Tbec (Aspirin) .Marland Kitchen.. 1 By Mouth Once Daily 20)  Trueresult Blood Glucose W/device Kit (Blood Glucose Monitoring Suppl) .... Use As Directed 21)  Truetest Test  Strp (Glucose Blood) .... Use Two Times A Day Check Blood Sugars 22)  Trueplus Lancets 28g  Misc (Lancets) .... Use As Directed 23)  Alcohol Preps  Pads (Alcohol Swabs) .... Use As  Directed  Allergies (verified): No Known Drug Allergies  Past History:  Past Medical History: Reviewed history from 05/27/2010 and no changes required. Hypertension Diabetes mellitus type 2 Multiple Sclerosis  Anxiety Pulmonary Embolus/DVT - postsurg - 11/2006 Hypothyroidism GERD  physician roster - GI - Leone Payor neuro - Jeffries cards - Wall gyn - tavvon  Past Surgical History: Reviewed history from 02/16/2009 and no changes required. Laparoscopic cecal polyp resection 6/08 (Right hemicolectomy) Ex lap and ileostomy/Hartmann's pouch for anastamotic leak 6/08 Ileostomy reversal 11/08 Cholecystectomy Cesarean section x 2 Abdominoplasty Total thyroidectomy - 1990  Family History: mom - A&W - hx breast cancer dad - died - stomach cancer age 52 both GMs - DM one sis - A&W No FH of Colon Cancer:  Social History: Reviewed history from 05/21/2010 and no changes required. on medical leave since 04/2010 Married - lives with spouse Patient has never smoked.  Alcohol Use - no Daily Caffeine Use  Vital Signs:  Patient profile:   55 year old female Height:      65 inches Weight:      222 pounds BMI:     37.08 BSA:     2.07 Pulse rate:   64 / minute Pulse rhythm:   regular BP sitting:   128 / 74  (left arm) Cuff size:   large  Vitals Entered By: Ok Anis CMA (August 09, 2010 3:44 PM)  Physical Exam  General:  obese.  no acute distress Eyes:  anicteric Lungs:  Clear throughout to auscultation. Heart:  Regular rate and rhythm; no murmurs, rubs,  or bruits. Abdomen:  soft, non-tender, normal bowel sounds, no distention; no masses and no appreciable hepatomegaly or splenomegaly.   obese surgical scars and lax abd wall Extremities:  small healing puncture wound with surrounding brown skin discoloration on right forearm   Impression & Recommendations:  Problem # 1:  DIARRHEA, CHRONIC (ICD-787.91) Assessment Deteriorated ? acute infection vs IBS started  before Augmentin but now the Augmentin could be part of this - though says she has not had before  Labs as below and use more Lomotil for symptom control for now. This is not unlike previous issues she has had. Orders: TLB-TSH (Thyroid Stimulating Hormone) (84443-TSH) T-Culture, Stool ([id number]) T-C diff by PCR ([id number]) T-Fecal WBC ([id number])  Problem # 2:  TRANSAMINASES, SERUM, ELEVATED (ICD-790.4) Chronic problem and has known fatty liver on CT (last in 3/11). Some could be from medications also.   Orders: TLB-Hepatic/Liver Function Pnl ([id number]-HEPATIC)  Problem # 3:  IBS (ICD-564.1) her abdominal complaints are consistent with problems she has had in past await stool studies and completion of Augmentin  Patient Instructions: 1)  Labs and stool studies ordered for you to go to basement floor and have drawn. 2)  BRAT diet (bread,rice,applesauce and toast). 3)  Force fluids 4)  Increase Lomotil as needed for your diarrhea. 5)  Copy sent to : Raenette Rover  Felicity Coyer MD 6)  The medication list was reviewed and reconciled.  All changed / newly prescribed medications were explained.  A complete medication list was provided to the patient / caregiver.  Appended Document: abd pain  pleas also cc: Dr. Philbert Riser [facility]

## 2010-08-14 NOTE — Progress Notes (Signed)
Summary: Appt asap abnormal labs & swollen abdomen   Phone Note Call from Patient Call back at Home Phone [phone]   Call For: Dr Leone Payor Reason for Call: Talk to Nurse Summary of Call: Wants an appoinment asap. Abnormal labs, abd is swollen. Upset I would not just find [facility] & transfer call. Initial call taken by: Leanor Kail [facility],  August 08, 2010 8:33 AM  Follow-up for Phone Call        I have scheduled an appointment for her tomorrow at 3:30 Follow-up by: Darcey Nora RN, CGRN,  August 08, 2010 9:25 AM

## 2010-08-16 ENCOUNTER — Telehealth (INDEPENDENT_AMBULATORY_CARE_PROVIDER_SITE_OTHER): Payer: Self-pay

## 2010-08-20 NOTE — Progress Notes (Signed)
Summary: LFT's still elevated, more tests   Phone Note Outgoing Call   Summary of Call: Let her know labs show that LFT's (transaminases are a bit worse). liver is still working ok as far as we can tell. i still think fatty liver likely issue and/or meds but recommend some additional tests: ferritin, ANA, anti-smooth muscle Ab, ceruloplasmin, alpha-1 antitrypsin, Hep B surface antigen and a hep C ab for dx 790.4 [name] Boop MD, [facility]  August 11, 2010 6:17 PM   Follow-up for Phone Call        Left message for patient to call back [name] Nora RN, [facility]  August 12, 2010 9:16 AM  She was seen today by Dr [name], does not believe that the elevated LFT's are from Tysabri.   Patient and he are wondering if it could be from glimeperide.  She will come for lab work tomorrow or Wed. Follow-up by: [name] Nora RN, CGRN,  August 12, 2010 2:41 PM

## 2010-08-20 NOTE — Progress Notes (Signed)
Summary: LAb results and update on condition   Phone Note Call from Patient Call back at Home Phone [phone]   Caller: Patient Summary of Call: [name] called this am to inquire about her lab results.  I have reviewed with her that her labs were normal, bu Dr Elberta Leatherwood has not commented on them yet.  She wanted Dr Leone Payor to be aware that her diarrhea has impoved.  Still a little loose but not quite as often. Initial call taken by: Darcey Nora RN, CGRN,  August 16, 2010 8:58 AM  Follow-up for Phone Call        as you told her serologic testing for other liver disease wasn negative I still think fatty iver as part of metabolic syndrome is most likely cause + fecal lactoferrin but stool studies otherwise negative I suspect possible transient infection, Augmentin effects and IBS as causes of diarrhea if she is tolerating things i would not add any meds recheck LFT's in 6 weeks re 790.4 dx call back and f/u  as needed for now  Follow-up by: [name] MD, [name],  August 16, 2010 12:26 PM  Additional Follow-up for Phone Call Additional follow up Details #1::        patient advised new lab orders entered for 09/27/10 Additional Follow-up by: Darcey Nora RN, CGRN,  August 16, 2010 3:03 PM

## 2010-08-21 ENCOUNTER — Ambulatory Visit: Payer: Self-pay | Admitting: *Deleted

## 2010-09-05 ENCOUNTER — Other Ambulatory Visit: Payer: Self-pay | Admitting: Internal Medicine

## 2010-09-05 ENCOUNTER — Ambulatory Visit (INDEPENDENT_AMBULATORY_CARE_PROVIDER_SITE_OTHER): Payer: Commercial Managed Care - PPO | Admitting: Internal Medicine

## 2010-09-05 ENCOUNTER — Encounter: Payer: Self-pay | Admitting: Internal Medicine

## 2010-09-05 ENCOUNTER — Other Ambulatory Visit: Payer: Commercial Managed Care - PPO

## 2010-09-05 DIAGNOSIS — E039 Hypothyroidism, unspecified: Secondary | ICD-10-CM

## 2010-09-05 DIAGNOSIS — E119 Type 2 diabetes mellitus without complications: Secondary | ICD-10-CM

## 2010-09-05 DIAGNOSIS — G589 Mononeuropathy, unspecified: Secondary | ICD-10-CM | POA: Insufficient documentation

## 2010-09-05 DIAGNOSIS — I10 Essential (primary) hypertension: Secondary | ICD-10-CM

## 2010-09-10 NOTE — Assessment & Plan Note (Signed)
Summary: check blood sugar/#.cd   Vital Signs:  Patient profile:   55 year old female Height:      66 inches (167.64 cm) Weight:      217 pounds (98.64 kg) O2 Sat:      98 % on Room air Temp:     98.6 degrees F (37.00 degrees C) oral Pulse rate:   68 / minute BP sitting:   120 / 82  (left arm) Cuff size:   large  Vitals Entered By: Orlan Leavens RMA (September 05, 2010 10:55 AM)  O2 Flow:  Room air CC: Follow-up on BS Is Patient Diabetic? Yes Did you bring your meter with you today? Yes Pain Assessment Patient in pain? no      Comments Want to discuss getting Anxiety removed from [facility]   Primary Care Provider:  Newt Lukes MD  CC:  Follow-up on BS.  History of Present Illness: here for f/u:  reviewed chronic med issues DM2 - on amyaryl - no hypoglycemic symptoms/events, AM fasting cbg 120-160s -   MS - follows with dr. Lin Givens in [place] - recent exac in symptoms summer 2011 and flare 06/2010 - s/p tysabri infusions 01/2010 thru 12/201 - no med tx planned at this time -  on medical leave for same per neuro, pursuing disabliity  dyslipidemia - holding statin treatment due to LFTs abn - recent gi eval reviewed  hypothyroid - no recent changes in dosing - no skin changes, +weight changes, intentional - lost >30lbs thus far  HTN - reports compliance with ongoing medical treatment and no changes in medication dose or frequency. denies adverse side effects related to current therapy. ?if tx still needed given wt loss and BP 110s at home  also c/o B foot pad numbness (under MT heads) - onset 06/2010 with last flare of MS - ?MS or pred induced - pain present daily but to varing degrees of severity - unable to walk when pain sever, othertime only bothersome numbness sensation  Clinical Review Panels:  Lipid Management   Cholesterol:  150 (05/21/2010)   LDL (bad choesterol):  85 (05/21/2010)   HDL (good cholesterol):  33.90 (05/21/2010)   Triglycerides:  199  (04/03/2010)  Diabetes Management   HgBA1C:  6.5 (05/21/2010)   Creatinine:  0.7 (07/31/2010)   Last Flu Vaccine:  Historical given @ wrk (03/23/2010)  CBC   WBC:  6.9 (08/09/2010)   RBC:  4.46 (08/09/2010)   Hgb:  13.7 (08/09/2010)   Hct:  39.9 (08/09/2010)   Platelets:  287 (08/09/2010)   MCV  89.5 (08/09/2010)   MCHC  34.3 (08/09/2010)   RDW  14.1 (08/09/2010)   PMN:  56 (08/09/2010)   Lymphs:  35 (08/09/2010)   Monos:  8 (08/09/2010)   Eosinophils:  2 (08/09/2010)   Basophil:  0 (08/09/2010)  Complete Metabolic Panel   Glucose:  106 (07/31/2010)   Sodium:  140 (07/31/2010)   Potassium:  3.9 (07/31/2010)   Chloride:  100 (07/31/2010)   CO2:  32 (07/31/2010)   BUN:  14 (07/31/2010)   Creatinine:  0.7 (07/31/2010)   Albumin:  4.0 (08/09/2010)   Total Protein:  7.1 (08/09/2010)   Calcium:  9.8 (07/31/2010)   Total Bili:  0.8 (08/09/2010)   Alk Phos:  111 (08/09/2010)   SGPT (ALT):  113 (08/09/2010)   SGOT (AST):  70 (08/09/2010)   Current Medications (verified): 1)  Synthroid 100 Mcg  Tabs (Levothyroxine Sodium) .... Take One By Mouth Once Daily 2)  Inderal La 120 Mg Xr24h-Cap (Propranolol Hcl) .Marland Kitchen.. 1 Cap Once Daily 3)  Prilosec 20 Mg  Cpdr (Omeprazole) .... Take One Tab By Mouth Once Daily 4)  Benicar Hct 20-12.5 Mg Tabs (Olmesartan Medoxomil-Hctz) .Marland Kitchen.. 1 By Mouth Once Daily 5)  Provigil 200 Mg  Tabs (Modafinil) .... Take One Tab By Mouth Two Times A Day As Needed 6)  Multivitamins  Tabs (Multiple Vitamin) .Marland Kitchen.. 1 By Mouth Once Daily 7)  Cvs Calcium-600 600 Mg Tabs (Calcium Carbonate) .Marland Kitchen.. 1 By Mouth Qd 8)  Lomotil 2.5-0.025 Mg  Tabs (Diphenoxylate-Atropine) .... As Needed 9)  Lovaza 1 Gm Caps (Omega-3-Acid Ethyl Esters) .... Take 2 Tablets Two Times A Day 10)  Lipitor 10 Mg Tabs (Atorvastatin Calcium) .... Hold 08/01/09 11)  Furosemide 40 Mg Tabs (Furosemide) .... Take 1 Once Daily 12)  Zovirax 400 Mg Tabs (Acyclovir) .... Take 1 Three Times A Day For 5 Days As  Needed 13)  Allopurinol 100 Mg Tabs (Allopurinol) .Marland Kitchen.. 1 By Mouth Once Daily 14)  Clonazepam 0.5 Mg Tabs (Clonazepam) .... Take 1 Three Times A Day As Needed 15)  Glimepiride 2 Mg Tabs (Glimepiride) .... 1/2-1 By Mouth  Every Morning As Directed 16)  Tysabri 300 Mg/91ml Conc (Natalizumab) .... Infusion Every 30days( On Hold) 17)  Aspirin 81 Mg Tbec (Aspirin) .Marland Kitchen.. 1 By Mouth Once Daily 18)  Trueresult Blood Glucose W/device Kit (Blood Glucose Monitoring Suppl) .... Use As Directed 19)  Truetest Test  Strp (Glucose Blood) .... Use Two Times A Day Check Blood Sugars 20)  Trueplus Lancets 28g  Misc (Lancets) .... Use As Directed  Allergies (verified): No Known Drug Allergies  Past History:  Past Medical History: Hypertension Diabetes mellitus type 2  Multiple Sclerosis   Anxiety Pulmonary Embolus/DVT - postsurg - 11/2006 Hypothyroidism  GERD  physician roster - GI - Leone Payor neuro - Jeffries cards - Wall gyn - tavvon  Review of Systems  The patient denies anorexia, weight gain, syncope, and headaches.    Physical Exam  General:  alert, well-developed, well-nourished, and cooperative to examination.    Neck:  supple, full ROM, no masses, no thyromegaly; no thyroid nodules or tenderness. no JVD or carotid bruits.   Lungs:  normal respiratory effort, no intercostal retractions or use of accessory muscles; normal breath sounds bilaterally - no crackles and no wheezes.    Heart:  normal rate, regular rhythm, no murmur, and no rub. BLE without edema.  Msk:  No deformity or scoliosis noted of thoracic or lumbar spine.  feet without ulceration - chronic numbness on medial side of great toe and dec sensation to touch over MT heads on sole of each foot   Impression & Recommendations:  Problem # 1:  DIABETES MELLITUS, TYPE II (ICD-250.00)  high AM cbgs by report - recent pred use for MS but none in past 4 weeks check a1c - consider inc OHA to two times a day vs change to new med  class metformn not good choice given D tendency -  Her updated medication list for this problem includes:    Benicar Hct 20-12.5 Mg Tabs (Olmesartan medoxomil-hctz) .Marland Kitchen... 1 by mouth once daily    Glimepiride 2 Mg Tabs (Glimepiride) .Marland Kitchen... 1/2-1 by mouth  every morning as directed    Aspirin 81 Mg Tbec (Aspirin) .Marland Kitchen... 1 by mouth once daily  Orders: TLB-A1C / Hgb A1C (Glycohemoglobin) (83036-A1C)  Labs Reviewed: Creat: 0.7 (07/31/2010)    Reviewed HgBA1c results: 6.5 (05/21/2010)  6.1 (02/16/2009)  Problem #  2:  NEUROPATHY (ICD-355.9) B foot symptoms of intermittent intensity present x 3 mo ?related to MS flare or DM -  consider add gabapentn and review further with neuro at next MS f/u  Problem # 3:  HYPOTHYROIDISM (ICD-244.9)  Her updated medication list for this problem includes:    Synthroid 100 Mcg Tabs (Levothyroxine sodium) .Marland Kitchen... Take one by mouth once daily  Orders: TLB-TSH (Thyroid Stimulating Hormone) (84443-TSH)  Labs Reviewed: TSH: 2.77 (08/09/2010)    HgBA1c: 6.5 (05/21/2010) Chol: 150 (05/21/2010)   HDL: 33.90 (05/21/2010)   LDL: 85 (05/21/2010)   TG: 158.0 (05/21/2010)  Problem # 4:  HYPERTENSION (ICD-401.9)  Her updated medication list for this problem includes:    Inderal La 120 Mg Xr24h-cap (Propranolol hcl) .Marland Kitchen... 1 cap once daily    Benicar Hct 20-12.5 Mg Tabs (Olmesartan medoxomil-hctz) .Marland Kitchen... 1 by mouth once daily    Furosemide 40 Mg Tabs (Furosemide) .Marland Kitchen... Take 1 once daily  BP today: 120/82 Prior BP: 128/74 (08/09/2010)  Labs Reviewed: K+: 3.9 (07/31/2010) Creat: : 0.7 (07/31/2010)   Chol: 150 (05/21/2010)   HDL: 33.90 (05/21/2010)   LDL: 85 (05/21/2010)   TG: 158.0 (05/21/2010)  Complete Medication List: 1)  Synthroid 100 Mcg Tabs (Levothyroxine sodium) .... Take one by mouth once daily 2)  Inderal La 120 Mg Xr24h-cap (Propranolol hcl) .Marland Kitchen.. 1 cap once daily 3)  Prilosec 20 Mg Cpdr (Omeprazole) .... Take one tab by mouth once daily 4)  Benicar  Hct 20-12.5 Mg Tabs (Olmesartan medoxomil-hctz) .Marland Kitchen.. 1 by mouth once daily 5)  Provigil 200 Mg Tabs (Modafinil) .... Take one tab by mouth two times a day as needed 6)  Multivitamins Tabs (Multiple vitamin) .Marland Kitchen.. 1 by mouth once daily 7)  Cvs Calcium-600 600 Mg Tabs (Calcium carbonate) .Marland Kitchen.. 1 by mouth qd 8)  Lomotil 2.5-0.025 Mg Tabs (Diphenoxylate-atropine) .... As needed 9)  Lovaza 1 Gm Caps (Omega-3-acid ethyl esters) .... Take 2 tablets two times a day 10)  Lipitor 10 Mg Tabs (Atorvastatin calcium) .... Hold 08/01/09 11)  Furosemide 40 Mg Tabs (Furosemide) .... Take 1 once daily 12)  Zovirax 400 Mg Tabs (Acyclovir) .... Take 1 three times a day for 5 days as needed 13)  Allopurinol 100 Mg Tabs (Allopurinol) .Marland Kitchen.. 1 by mouth once daily 14)  Clonazepam 0.5 Mg Tabs (Clonazepam) .... Take 1 three times a day as needed 15)  Glimepiride 2 Mg Tabs (Glimepiride) .... 1/2-1 by mouth  every morning as directed 16)  Aspirin 81 Mg Tbec (Aspirin) .Marland Kitchen.. 1 by mouth once daily 17)  Trueresult Blood Glucose W/device Kit (Blood glucose monitoring suppl) .... Use as directed 18)  Truetest Test Strp (Glucose blood) .... Use two times a day check blood sugars 19)  Trueplus Lancets 28g Misc (Lancets) .... Use as directed  Patient Instructions: 1)  it was good to see you today. 2)  test(s) ordered today - your results will be called to you after review in 48-72 hours from the time of test completion - will consider DM med adjustment vs addition of gabapentin for your nerve pain in feet 3)  no medications at this moment but will followup after lab review 4)  Check your Blood Pressure regularly. If it is under 100/60, let us know 5)  Please schedule a follow-up appointment in 3-4 month to monitor diabetes, call sooner if problems.    Orders Added: 1)  TLB-A1C / Hgb A1C (Glycohemoglobin) [83036-A1C] 2)  TLB-TSH (Thyroid Stimulating Hormone) [84443-TSH] 3)  Est. Patient  Level IV [id number]

## 2010-09-15 LAB — HEMOGLOBIN A1C: Mean Plasma Glucose: 146 mg/dL

## 2010-09-15 LAB — T4: T4, Total: 8.1 ug/dL (ref 5.0–12.5)

## 2010-09-15 LAB — FECAL LACTOFERRIN, QUANT

## 2010-09-15 LAB — COMPREHENSIVE METABOLIC PANEL
AST: 32 U/L (ref 0–37)
Albumin: 2.9 g/dL — ABNORMAL LOW (ref 3.5–5.2)
BUN: 7 mg/dL (ref 6–23)
Calcium: 8.5 mg/dL (ref 8.4–10.5)
Creatinine, Ser: 0.71 mg/dL (ref 0.4–1.2)
GFR calc Af Amer: >60 mL/min (ref >60)
Total Bilirubin: 0.3 mg/dL (ref 0.3–1.2)
Total Protein: 5.8 g/dL — ABNORMAL LOW (ref 6.0–8.3)

## 2010-09-15 LAB — LIPID PANEL
Cholesterol: 141 mg/dL (ref 0–200)
HDL: 30 mg/dL — ABNORMAL LOW (ref >39)
LDL Cholesterol: 77 mg/dL (ref 0–99)
Total CHOL/HDL Ratio: 4.7 RATIO
VLDL: 34 mg/dL (ref 0–40)

## 2010-09-15 LAB — CLOSTRIDIUM DIFFICILE EIA: C difficile Toxins A+B, EIA: NEGATIVE

## 2010-09-15 LAB — CULTURE, BLOOD (ROUTINE X 2)
Culture: NO GROWTH
Culture: NO GROWTH

## 2010-09-15 LAB — CBC
HCT: 30.9 % — ABNORMAL LOW (ref 36.0–46.0)
MCHC: 33.4 g/dL (ref 30.0–36.0)
MCV: 93.3 fL (ref 78.0–100.0)
Platelets: 212 10*3/uL (ref 150–400)
RDW: 13.9 % (ref 11.5–15.5)

## 2010-09-15 LAB — OVA AND PARASITE EXAMINATION

## 2010-09-16 LAB — COMPREHENSIVE METABOLIC PANEL
ALT: 51 U/L — ABNORMAL HIGH (ref 0–35)
AST: 42 U/L — ABNORMAL HIGH (ref 0–37)
CO2: 30 mEq/L (ref 19–32)
Chloride: 100 mEq/L (ref 96–112)
GFR calc Af Amer: >60 mL/min (ref >60)
GFR calc non Af Amer: >60 mL/min (ref >60)
Sodium: 142 mEq/L (ref 135–145)
Total Bilirubin: 0.6 mg/dL (ref 0.3–1.2)

## 2010-09-16 LAB — URINALYSIS, ROUTINE W REFLEX MICROSCOPIC
Ketones, ur: NEGATIVE mg/dL
Nitrite: NEGATIVE
Protein, ur: 30 mg/dL — AB
pH: 6 (ref 5.0–8.0)

## 2010-09-16 LAB — URINE MICROSCOPIC-ADD ON

## 2010-09-16 LAB — DIFFERENTIAL
Basophils Absolute: 0 10*3/uL (ref 0.0–0.1)
Basophils Relative: 1 % (ref 0–1)
Eosinophils Absolute: 0 10*3/uL (ref 0.0–0.7)
Eosinophils Relative: 1 % (ref 0–5)

## 2010-09-16 LAB — HEMOCCULT GUIAC POC 1CARD (OFFICE): Fecal Occult Bld: POSITIVE

## 2010-09-16 LAB — LIPASE, BLOOD: Lipase: 45 U/L (ref 23–300)

## 2010-09-16 LAB — CBC
Hemoglobin: 12.4 g/dL (ref 12.0–15.0)
MCV: 92.6 fL (ref 78.0–100.0)
RBC: 4.03 MIL/uL (ref 3.87–5.11)
WBC: 6 10*3/uL (ref 4.0–10.5)

## 2010-09-18 ENCOUNTER — Other Ambulatory Visit: Payer: Self-pay | Admitting: Internal Medicine

## 2010-09-24 ENCOUNTER — Other Ambulatory Visit: Payer: Self-pay | Admitting: Internal Medicine

## 2010-09-25 NOTE — Telephone Encounter (Signed)
Ok to refill - prescription printed and signed -

## 2010-09-26 MED ORDER — PROPRANOLOL HCL ER 120 MG PO CP24: 120.0000 mg | ORAL_CAPSULE | Freq: Every day | ORAL | Status: DC

## 2010-09-26 NOTE — Telephone Encounter (Signed)
Sent Inderal fax klonopin manually to [facility]...Raechel Chute

## 2010-09-27 ENCOUNTER — Other Ambulatory Visit: Payer: Commercial Managed Care - PPO

## 2010-10-03 ENCOUNTER — Other Ambulatory Visit: Payer: Self-pay | Admitting: Internal Medicine

## 2010-10-03 ENCOUNTER — Other Ambulatory Visit: Payer: Commercial Managed Care - PPO

## 2010-10-08 LAB — BASIC METABOLIC PANEL
BUN: 13 mg/dL (ref 6–23)
Chloride: 96 mEq/L (ref 96–112)
Glucose, Bld: 204 mg/dL — ABNORMAL HIGH (ref 70–99)
Potassium: 3.9 mEq/L (ref 3.5–5.1)

## 2010-10-08 LAB — GLUCOSE, CAPILLARY
Glucose-Capillary: 140 mg/dL — ABNORMAL HIGH (ref 70–99)
Glucose-Capillary: 156 mg/dL — ABNORMAL HIGH (ref 70–99)
Glucose-Capillary: 178 mg/dL — ABNORMAL HIGH (ref 70–99)
Glucose-Capillary: 180 mg/dL — ABNORMAL HIGH (ref 70–99)
Glucose-Capillary: 184 mg/dL — ABNORMAL HIGH (ref 70–99)
Glucose-Capillary: 195 mg/dL — ABNORMAL HIGH (ref 70–99)
Glucose-Capillary: 216 mg/dL — ABNORMAL HIGH (ref 70–99)
Glucose-Capillary: 233 mg/dL — ABNORMAL HIGH (ref 70–99)
Glucose-Capillary: 289 mg/dL — ABNORMAL HIGH (ref 70–99)

## 2010-10-08 LAB — HEMOGLOBIN AND HEMATOCRIT, BLOOD: Hemoglobin: 12.8 g/dL (ref 12.0–15.0)

## 2010-10-08 LAB — HEMOGLOBIN A1C: Mean Plasma Glucose: 180 mg/dL

## 2010-10-21 ENCOUNTER — Other Ambulatory Visit (INDEPENDENT_AMBULATORY_CARE_PROVIDER_SITE_OTHER): Payer: Commercial Managed Care - PPO

## 2010-10-21 ENCOUNTER — Other Ambulatory Visit (INDEPENDENT_AMBULATORY_CARE_PROVIDER_SITE_OTHER): Payer: Commercial Managed Care - PPO | Admitting: Internal Medicine

## 2010-10-21 DIAGNOSIS — Z13220 Encounter for screening for lipoid disorders: Secondary | ICD-10-CM

## 2010-10-21 LAB — HEPATIC FUNCTION PANEL
ALT: 67 U/L — ABNORMAL HIGH (ref 0–35)
Bilirubin, Direct: 0.1 mg/dL (ref 0.0–0.3)
Total Bilirubin: 0.6 mg/dL (ref 0.3–1.2)

## 2010-10-21 LAB — LIPID PANEL: HDL: 39.8 mg/dL (ref >39.00)

## 2010-10-22 ENCOUNTER — Telehealth: Payer: Self-pay

## 2010-10-22 NOTE — Telephone Encounter (Signed)
I have left a voicemail for [name] and mailed her a copy of these labs.  New lab orders placed for 12/22/10

## 2010-10-22 NOTE — Progress Notes (Signed)
Quick Note:  Liver tests are better and transaminases are only mildly abnormal. This is good news. I would repeat her LFTs in 2 months. She may have a copy of these.  Her lipids show mild elevations in triglycerides and VLDL, we'll have to defer to primary care doctor for further recommendations. I am not certain I ordered these. ______

## 2010-10-22 NOTE — Telephone Encounter (Signed)
Message copied by Darcey Nora on Tue Oct 22, 2010  9:27 AM ------      Message from: Stan Head      Created: Tue Oct 22, 2010  8:25 AM       Liver tests are better and transaminases are only mildly abnormal. This is good news. I would repeat her LFTs in 2 months. She may have a copy of these.            Her lipids show mild elevations in triglycerides and VLDL, we'll have to defer to primary care doctor for further recommendations. I am not certain I ordered these.

## 2010-10-25 ENCOUNTER — Other Ambulatory Visit: Payer: Self-pay | Admitting: Internal Medicine

## 2010-10-25 MED ORDER — GLIMEPIRIDE 2 MG PO TABS: ORAL_TABLET | ORAL | Status: DC

## 2010-10-25 NOTE — Telephone Encounter (Signed)
R'c fax from [facility] for pt's Glimepiride rx.  Last OV-09/05/2010  Last Filled 07/31/2010

## 2010-11-01 ENCOUNTER — Telehealth: Payer: Self-pay | Admitting: Internal Medicine

## 2010-11-01 NOTE — Telephone Encounter (Signed)
I have sent her another copy of the labs.

## 2010-11-05 ENCOUNTER — Encounter: Payer: Self-pay | Admitting: Internal Medicine

## 2010-11-05 NOTE — Discharge Summary (Signed)
[name], [name]                ACCOUNT NO.:  [id number]   MEDICAL RECORD NO.:  [id number]          PATIENT TYPE:  INP   LOCATION:  1536                         FACILITY:  [facility]   PHYSICIAN:  Ardeth Sportsman, MD     DATE OF BIRTH:  Dec 24, 1955   DATE OF ADMISSION:  12/13/2006  DATE OF DISCHARGE:                               DISCHARGE SUMMARY   PRIMARY CARE PHYSICIAN:  Merlene Laughter. Renae Gloss, M.D.   GASTROENTEROLOGIST:  Graylin Shiver, M.D.   PULMONOLOGIST:  Barbaraann Share, M.D.   DIAGNOSES:  1. Anastomotic leak, status post right hemicolectomy.  2. Cecovillous adenoma with high grade dysplasia requiring      hemicolectomy.  3. Asymptomatic deep venous thrombosis with prior history 1978.  4. Hypothyroidism.  5. Multiple sclerosis.  6. Gastroesophageal reflux disease.  7. Oral sores, possible thrush.  8. Anxiety disorder.  9. Possible panic disorder.  10.Questionable irritable bowel syndrome.  11.Status post laparoscopic cholecystectomy.  12.Status post total thyroidectomy.  13.Status post Cesarean section x2.  14.Status post abdominoplasty.  15.Status post laparoscopic right hemicolectomy on December 13, 2006.   PROCEDURES PERFORMED:  Exploratory laparotomy and resection of  anastomotic leak and Hartman's procedure with end ileostomy on December 14, 2006 by Dr. Harlon Flor and Zachery Dakins.   HOSPITAL COURSE:  Ms. [name] is a 55 year old female who had a large  villous adenoma, too large to be removed endoscopically.  She underwent  elected resection of this and was ultimately able to be discharged about  a week later.  Unfortunately, a few days after that she developed  worsening fevers and abdominal pain and came into the emergency room  requiring emergency surgery on postoperative day #10.  She had  anastomotic leak and required resection and end ileostomy.   Postoperatively, she did have some shock and required some fluid  resuscitation and aggressive antibiotics and ultimately she  normalized  and was able to be transferred to the floor.  She had a prolonged  postoperative ileus requiring nasogastric tube decompression.  She had  repeat CT scan which actually showed a couple of fluid collections  concerning for abscesses.  Interventional radiology was consulted and  abscess drainage was performed on December 21, 2006, postoperative day #7.  After this, she began to have ileostomy output and her NG tube output  went down.  Her NG tube was removed and by the time of discharge she was  tolerating a solid ostomy diet and liquids rather well.  Her ileostomy  output was a little bit watery but she had good education and was  actually able to changer her wafer herself with wound ostomy nursing  supervision.  Her incision had a little bit of drainage and she had  wicks placed superiorly and inferiorly and this seemed to improve.  Staples were removed at the time of discharge.   She had been on IV parenteral nutrition, but this was weaned off.  She  was tolerating the ostomy diet relatively well.   She had a followup CT scan done about five days after the last drainage  and  was incidentally noted to have evidence of deep venous thrombosis in  the right lower lobe lung.  She did not have any significant pulmonary  sequelae with this but pulmonary was consulted and she was  anticoagulated on IV heparin.  Consultation was made with pharmacy at  [facility] anticoagulation and the feeling was it was safe to transition  her over to Lovenox and ultimately oral Coumadin, which was already  started at the time of discharge.   DISCHARGE INSTRUCTIONS:  Based on these improvements, it was felt it  would be reasonable to discharge her home with the following  instructions:  1. She is to return to clinic to see me, Dr. Michaell Cowing, in about 7 to 10      days.  2. She should shower every day and keep her incision clean and dry and      use the dressing care which includes Aquacel AG to the  abdominal      wound every other day, packing a small piece into the open upper      inferior parts of the wounds.  3. She should have ileostomy care with home health.  4. She is given instructions on an ostomy diet and encouraged to drink      plenty of liquids.  She is also instructed regarding using Imodium      to help decrease any issues of dehydration and diarrhea and also to      avoid warm parts of the day.  5. She should transition over from Zosyn to Augmentin 875 mg p.o.      b.i.d. to help prevent further abscess formation.  6. She will have pain control with Darvocet-N 100 one to two p.o.      q.6h. p.r.n. pain along with Tylenol and ibuprofen p.r.n.   DISCHARGE MEDICATIONS:  She can be discharged with home medications  which include:  1. Inderal 60 mg p.o. daily.  2. Prilosec 20 mg b.i.d.  3. Synthroid 100 mcg daily.  4. Benicar/hydrochlorothiazide 20/12.5 daily.  5. Provigil 200 mg daily.  6. Rebif 44 mcg subcutaneously t.i.d.  7. Vitamin C 1 gram b.i.d.  8. Calcium 750 mg b.i.d.  9. Lasix 20 mg p.r.n.   SPECIAL INSTRUCTIONS:  She should call if she has any fevers, chills,  sweats, nausea, vomiting or any other concerns.      Ardeth Sportsman, MD  Electronically Signed     SCG/MEDQ  D:  12/28/2006  T:  12/28/2006  Job:  [id number]   cc:   Merlene Laughter. Renae Gloss, M.D.  Fax: [phone]   Graylin Shiver, M.D.  Fax: [phone]   Barbaraann Share, MD,FCCP  [address]  [place]  [place] [address]

## 2010-11-05 NOTE — Op Note (Signed)
[name], THOLL                ACCOUNT NO.:  [id number]   MEDICAL RECORD NO.:  [id number]          PATIENT TYPE:  INP   LOCATION:  1528                         FACILITY:  [facility]   PHYSICIAN:  Ardeth Sportsman, MD     DATE OF BIRTH:  Sep 22, 1955   DATE OF PROCEDURE:  12/04/2006  DATE OF DISCHARGE:                               OPERATIVE REPORT   PRIMARY CARE PHYSICIAN:  Merlene Laughter. Renae Gloss, M.D.   GASTROENTEROLOGIST:  Graylin Shiver, M.D.   PREOPERATIVE DIAGNOSIS:  Large cecal tubulovillous adenoma, too large to  be removed endoscopically.   POSTOPERATIVE DIAGNOSIS:  Large cecal tubulovillous adenoma, too large  to be removed endoscopically.   OPERATIONS PERFORMED:  Laparoscopic lysis of adhesions x16 minutes (=40%  of the case).  Laparoscopically assisted right hemicolectomy.   SURGEON:  Ardeth Sportsman, M.D.   ASSISTANT:  Leonie Man, M.D.   ANESTHESIA:  1. General.  2. Local anesthetic in a field block at all port sites and along the      extraction, periumbilical midline extraction incision.   SPECIMENS:  Right colon with terminal ileum.   DRAINS:  None.   ESTIMATED BLOOD LOSS:  100 mL.   COMPLICATIONS:  None apparent.   INDICATIONS FOR PROCEDURE:  Ms. General is a 55 year old female who had  noticed a change in bowel habits and underwent endoscopy, was found to  have a cecal mass that was too large to be removed endoscopically.  Consultation was made with me.  The pathophysiology of colonic polyposis  and the risks of development of carcinomatosis was explained.  The  natural history was explained.  Options were discussed, recommendations  were made for laparoscopically assisted right hemicolectomy with  reanastomosis.   The risks of stroke, heart attack, deep venous thrombosis, pulmonary  embolus and death were discussed.  The risks such as bleeding, need for  transfusion, wound infection, abscess, injury to other organs,  anastomotic leak resulting in need  for reoperation and ileostomy and  prolonged pain, conversion to open and other risks were discussed.  Questions answered and she agreed to proceed.   OPERATIVE FINDINGS:  She had some moderate adhesions in her right  abdomen and right lower quadrant and thickened, shortened mesentery,  especially small bowel.  She had some fatty steatohepatosis but  otherwise was okay.  She had moderate sized cecal polyp noted.   DESCRIPTION OF PROCEDURE:  Informed consent was confirmed.  The patient  received intravenous antibiotics just prior to induction.  She had  sequential compression devices active during the entire case.  She  underwent general anesthesia without difficulty.  She had a Foley  catheter sterilely placed.  She was placed on a split leg table with  foot rest with her arms tucked.  The patient's abdomen was prepped and  draped in sterile fashion.   Entry was gained to the abdomen with the patient in steep reversed  Trendelenburg position, left side up using optical entry with a 5 mm 0  degree scope.  Capnoperitoneum to 15 mmHg provided good abdominal  insufflation.  Under direct  visualization, 5 mm ports were placed in the  left and right lower quadrants along her prior abdominoplasty incision  and another one in her right lower quadrant, right midabdomen.   She had some moderate adhesions of omentum in her right lower quadrant.  These were freed off.  She had some dense adhesions of her small bowel  to the right lower quadrant and these were carefully freed off sharply  as well.  There were some adhesions to small bowel and sidewall but  ultimately I was able to mobilize the greater omentum cephalad above the  transverse colon.  I tried to get a good access to the ileocolic branch  by seeing the cecum and elevating it anteriorly, but her small bowel had  a very thickened mesentery.  Given all the adhesions, I decided to  switch to lateral-to-medial mobilization.  Therefore, the  colon was  medialized from the right towards the midline by incising along the line  of Toldt up to the hepatic flexure.  The proximal transverse colon was  pulled down inferiorly and it was freed down some as well.  A window was  made between the transverse colon and the greater omentum to help  reflect the greater omentum off the transverse colon from the distal two  thirds of the transverse colon proximally all the way to the hepatic  flexure.  This helped provide hepatic flexure mobilization of the colon  and help decrease a need for dissection around her prior cholecystectomy  incision.  Her right colon was able to be completely mobilized across  midline towards the left side with careful removal of retroperitoneal  attachments.  The duodenal sweep could be seen and it was kept posterior  and preserved as carefully as possible.  With this more adequate  mobilization, I could better isolate the ileocolic branch and elevate it  anteriorly.  A window was made just inferior and superior to the  ileocolic vessels, brought down close to its takeoff from the SMA.  The  ileocolic vessels was ligated using a Ligasure.   Capnoperitoneum was completely evacuated.  A periumbilical vertical  midline incision about 5 cm was done.  It had to be carried to 7 cm  ultimately.  I was able to get through the fascia.  Wound protector was  placed and ultimately I was able to exteriorize the right colon and  terminal ileum.  She had very thickened mesentery so it was a little  tight, but ultimately got up pretty well.  Inspection was made and I had  ligated a good length of ileocolic artery.  A side-to-side ileocolonic  anastomosis was made from the ileum about 15 cm from the terminal ileum  and the proximal transverse colon.  The staple line defect was closed in  staple fashion as well and the specimen was removed.   Specimen was opened up at the side table to confirm we had removed the  actual cecum  and there were no other mucosal abnormalities and there  were not.   The staple line at the corners was imbricated using interrupted 3-0 PDS  stitches.  Because she had a very short thickened mesentery, she did not  have much of a mesentery defect and it was a little too thick to try and  close through the small incision, so we allowed it to fall back down.   The wound protector was clamped and capnoperitoneum was reintroduced.  Camera inspection revealed no evidence of any active bleeding.  Again,  the mesenteric defect between the ileocolonic anastomosis was not  twisted or torqued.  The anastamosis showed no ischemia.  The greater  omentum was allowed to lie over the entire right colon and most of the  small bowel since there was a nice thick omentum and that seemed to help  protect things well.  All but 1 port was removed with no evidence of any  bleeding.  Capnoperitoneum was evacuated.  Ports were removed.  The  wound protectors were removed.  The midline fascial defect was closed  using No. 1 Novofil monofilament suture in interrupted stitch.  The  wound was copiously irrigated with saline.  More field block of Marcaine  was made at the level of the fascia as well as the skin.  Especially at  the midline incision.  The skin at all places were closed using 4-0  Monocryl stitch.  Sterile dressings were applied.  Patient was extubated  and sent to the recovery room in stable condition.   I explained the operative findings to the patient's husband and  postoperative goals and instructions were discussed with him.  I  discussed that with the patient beforehand.  We will continue to follow  her.      Ardeth Sportsman, MD  Electronically Signed     SCG/MEDQ  D:  12/04/2006  T:  12/05/2006  Job:  [id number]   cc:   Merlene Laughter. Renae Gloss, M.D.  Fax: [phone]   Graylin Shiver, M.D.  Fax: [phone]

## 2010-11-05 NOTE — Discharge Summary (Signed)
[name], [name]                ACCOUNT NO.:  [id number]   MEDICAL RECORD NO.:  [id number]          PATIENT TYPE:  INP   LOCATION:  1528                         FACILITY:  [facility]   PHYSICIAN:  Ardeth Sportsman, MD     DATE OF BIRTH:  [birth year]   DATE OF ADMISSION:  12/04/2006  DATE OF DISCHARGE:  12/10/2006                               DISCHARGE SUMMARY   PRIMARY CARE PHYSICIAN:  Cala Bradford R. Renae Gloss, M.D.   GASTROENTEROLOGIST:  Graylin Shiver, M.D.   PRIMARY DIAGNOSIS:  Tubulovillous adenoma of the cecum with a high-grade  dysplasia but no carcinoma, 0-14 lymph nodes.   OTHER DIAGNOSES:  1. Hypothyroidism.  2. Multiple sclerosis diagnosed November of 2007.  3. Chronic constipation.  4. Questionable history of deep vein thrombosis in 1978.  5. Gastroesophageal reflux disease.  6. Negative cardiac stress done in 2007.  7. Some anxiety.  8. Questionable irritable bowel syndrome.  9. Questionable panic disorder component.  10.Status post cholecystectomy.  11.Status post thyroidectomy.  12.Status post cesarean section x2.  13.Status post abdominoplasty.   PROCEDURE PERFORMED:  Laparoscopically assisted right hemicolectomy with  anastomosis on December 04, 2006.   SUMMARY OF HOSPITAL COURSE:  [name] is a 55 year old female who was  found to have a cecal mass.  She underwent resection, given the fact  that it was too large to be removed endoscopically.  Postoperatively,  she did have some anemia, but ultimately her hemoglobin stabilized  around 9.  She struggled with some nausea, but by the time of discharge,  was having bowel movements and tolerating a solid diet.  We did switch  her narcotics around, and ultimately she seemed to have better pain  control when she was switched over to pain pills.   She began walking well and was walking the hallways at least 4 times a  day.  She did have an episode of shortness of breath and a feeling like  she could no breathe, but she had  no hypoxia & chest x-ray just showed  atelectasis and no evidence of any edema.  She was weaned off her IV  fluids and actually given a diuretic, and she had some mild diuresis and  seemed to improve.  By the time of discharge, she was eating, tolerating  it well, having bowel movements, somewhat loose, but not dehydrated, and  with no nausea and good pain control.  The patient was improved, and we  thought it was reasonable for her to be discharged home with the  following instructions.   DISCHARGE INSTRUCTIONS:  1. She is to return to the clinic to see me in about 2 weeks for      follow up.  She wishes to look at a film of the surgery.  I do have      a film of a similar surgery.  Unfortunately, the film in her case      was not able to be properly copied.  2. She should walk at least a half an hour a day to remain physically      active,  broken up in 5-10 minute walks.  3. She should drink plenty of liquids and advance to a regular diet as      tolerated.  4. I recommend she take some Metamucil or Citrucel or FiberCon or some      source of fiber at least twice a day to help thicken up her stools      and avoid any problems with diarrhea.  5. She should call if she has any fevers, chills, sweats, nausea,      vomiting, worsening pain, incisional drainage or any other      concerns.  6. She should continue her home medications, which include Synthroid      100 mcg daily, Provigil 200 mg daily in the a.m., Refib 44 mg      subcutaneous three times a week, Omeprazole 20 mg b.i.d., Benicar      20/12.5 daily, Inderal 60 mg daily, Lasix 20 mg p.r.n., calcium 750      p.o. b.i.d., vitamin C b.i.d., as well.  She was taking 3 gm a day,      and we recommended she go down to a maximum of two a day.      Ardeth Sportsman, MD  Electronically Signed     SCG/MEDQ  D:  12/10/2006  T:  12/10/2006  Job:  [id number]   cc:   Merlene Laughter. Renae Gloss, M.D.  Fax: [phone]   Graylin Shiver, M.D.   Fax: [phone]

## 2010-11-05 NOTE — Discharge Summary (Signed)
NAMECABELLA, KIMM                ACCOUNT NO.:  [id number]   MEDICAL RECORD NO.:  [id number]          PATIENT TYPE:  INP   LOCATION:  1533                         FACILITY:  [facility]   PHYSICIAN:  Ardeth Sportsman, MD     DATE OF BIRTH:  28-Dec-1955   DATE OF ADMISSION:  08/08/2008  DATE OF DISCHARGE:  08/11/2008                               DISCHARGE SUMMARY   PRIMARY CARE PHYSICIAN:  Cala Bradford R. Renae Gloss, M.D.   GASTROENTEROLOGIST:  [name], M.D.   SURGEON:  Ardeth Sportsman, M.D.   PRINCIPAL DIAGNOSIS:  Ventral wall incisional herniae x4.   PROCEDURE PERFORMED:  Laparoscopic lysis of adhesions and ventral hernia  repair x3 on 08/08/2008.   OTHER DIAGNOSES:  1. Obesity.  2. Glucose intolerance.  3. Multiple sclerosis.  4. Large tubulovillous adenoma of right colon, status post colectomy      with anastomosis complicated by abscess leak, ileostomy and      ileostomy takedown in 2008.  5. Deep venous thrombosis back in 2008, with a recurrent deep venous      thrombosis and pulmonary embolus in 2009.  6. Gastroesophageal reflux, controlled with Omeprazole.  7. Hypertension.  8. Negative cardiac stress test in 2007.  9. Hypothyroidism.   HISTORY:  [name] is a 55 year old obese female who developed  complications from surgery needed for a large tubulovillous adenoma,  including a abscess with an anastomotic leak of the ileostomy wound  infection and eventually recovered from that, with an ileostomy takedown  last year.  She was doing well. Unfortunately this past year she  developed a swelling at her incision, concerning for incisional herniae.   HOSPITAL COURSE:  She underwent a laparoscopic repair.  Instead of  having two probable herniae, she ended up having four.  I ended up  putting in two large pieces of mesh, overlapping and covered up most of  her anterior abdominal wall.   Postoperatively she was placed IV pain medications.  That helped control  her pain  (hydromorphone equals Dilaudid).  She was started on an oral  diet and by the time of discharge is tolerating solid food well.  She  was transitioned to oxycodone, Naprosyn and ice packs with a binder.  That seemed to help control her pain well.  She had an abdominal binder  that helped as well.  She was very motivated and was walking the  hallways immediately.  She remained active.  She was on subcutaneous  heparin for deep venous thrombosis prophylaxis.   Based on her improvement, it will be reasonable for her discharge home  on postoperative day three.   DISCHARGE INSTRUCTIONS:  1. She is to return to the clinic and see me in about 1-1/2 to 2      weeks.  2. She should walk at least 1/2 hour, if not 1 hour a day, to help      fight against an ileus or recurrent deep venous thrombosis and help      stimulate recovery.  3. She should take oxycodone 5 mg to 15 mg p.o. q.4h.  p.r.n. pain.  4. Naprosyn 500 mg p.o. twice daily with food, for the next week or so      as an adjunct for pain control.  5. Ice pack and/or heating pad and warm showers and warm baths for      pain control.  6. She should shower every day and keep the incisions clean and dry.  7. She can wear an abdominal binder as needed for comfort.  She says      that helps her out.  8. She did have elevated glucoses during her hospital stay in the mid-      100's.  Her hemoglobin A1c was elevated at 7.9, concerning for      diabetes, versus at least insulin intolerance.  I am making      attempts to get ahold of her primary care physician, Dr. Renae Gloss,      to see if she needs to be started on a hyperglycemia, versus at      least close followup.  I recommended to Ms. Trostle that she call Dr.      Renae Gloss to help set up an appointment.  9. She should call if she has worsening fevers, chills or sweats,      abdominal pain, discomfort, nausea or vomiting, etc.  The      postoperative instructions have been given and she has  an      instruction sheet in place.  She felt comfortable with this and      therefore she will be discharged home.      Ardeth Sportsman, MD  Electronically Signed     SCG/MEDQ  D:  08/11/2008  T:  08/11/2008  Job:  [id number]   cc:   [name], MD,FACG  [facility]  [address] [place], [place] [address]   Merlene Laughter. Renae Gloss, M.D.  Fax: [phone]   Andi Devon, M.D.  Lab reports to physician

## 2010-11-05 NOTE — Op Note (Signed)
NAME:  [name], [name]                ACCOUNT NO.:  [id number]   MEDICAL RECORD NO.:  [id number]          PATIENT TYPE:  AMB   LOCATION:  ENDO                         FACILITY:  [facility]   PHYSICIAN:  Graylin Shiver, M.D.   DATE OF BIRTH:  1955-11-16   DATE OF PROCEDURE:  11/04/2006  DATE OF DISCHARGE:                               OPERATIVE REPORT   INDICATIONS FOR PROCEDURE:  Screening.   Informed consent was obtained after explanation of the risks of  bleeding, infection and perforation.   PREMEDICATION:  Fentanyl 100 mcg IV, Versed 8 mg IV.   PROCEDURE:  With the patient in the left lateral decubitus position a  rectal exam was performed.  No masses were felt.  The Pentax colonoscope  was inserted into the rectum and advanced around the colon to the cecum.  Cecal landmarks were identified.  The scope was advanced into the  terminal ileum.  No abnormalities were seen in the terminal ileum.  The  scope was then brought back out into the cecum. In the cecum there was a  large 4.5 to 5-cm polyp which was growing off of the distal limb of the  ileocecal valve. I inspected the area and it seemed as though the polyp  was crawling into the valve mouth as well.  Multiple biopsies of this  large polypoid mass were obtained and did not feel that it could all be  removed endoscopically especially with it growing onto and into the lip  of the valve. The ascending colon looked normal.  The transverse colon  looked normal.  The descending colon, sigmoid looked normal. In the  rectum there was a 3-mm polyp that was biopsied off.  She tolerated the  procedure well without complications.   IMPRESSION:  1. Large 4.5 to 5 cm polyp in the cecum growing off and into the      ileocecal valve.  2. Small rectal polyp.   PLAN:  The biopsies will be checked.  The patient will get a surgical  referral. I feel this polyp in the cecum will need to be surgically  resected.     ______________________________  Graylin Shiver, M.D.     SFG/MEDQ  D:  11/04/2006  T:  11/05/2006  Job:  [id number]   cc:   Merlene Laughter. Renae Gloss, M.D.

## 2010-11-05 NOTE — Op Note (Signed)
[name], [name]                ACCOUNT NO.:  [id number]   MEDICAL RECORD NO.:  [id number]          PATIENT TYPE:  INP   LOCATION:  1225                         FACILITY:  [facility]   PHYSICIAN:  [name], M.D. DATE OF BIRTH:  03/12/1956   DATE OF PROCEDURE:  12/14/2006  DATE OF DISCHARGE:                               OPERATIVE REPORT   PREOPERATIVE DIAGNOSIS:  Anastomotic leak after right hemicolectomy.   POSTOPERATIVE DIAGNOSIS:  Anastomotic leak after right hemicolectomy.   PROCEDURE PERFORMED:  Exploratory laparotomy, drainage of intra-  abdominal abscess, resection of leaking anastomosis, creation of  ileostomy with a long Hartmann's pouch.   SURGEON:  [name], M.D.   ASSISTANT:  [name], M.D.   ANESTHESIA:  General endotracheal.   INDICATIONS:  The patient is a 55 year old female who is 10 days postop  from a laparoscopic right hemicolectomy by Dr. Karie [name] for a  tubulovillous adenoma of the cecum.  She was discharged on 06/19.  She  has had persistent fevers, increasing abdominal pain, diarrhea and  abdominal distension.  She has also developed drainage from her  umbilicus.  She presented to the emergency department tonight where she  was noted to have a white count elevated at 16.8.  Her hemoglobin was  9.4.  She was anemic upon discharge and this essentially unchanged.  Her  platelet count was elevated 547.  CT scan of the abdomen and pelvis  showed leaking contrast pooling in the right lower quadrant and in the  pelvis.  The decision was made to immediately bring her to the operating  room as there did not appear to be an abscess that could be drained.   OPERATIVE FINDINGS:  Large amount of stool and pus in the right abdomen.  The anastomosis appears to be widely leaking.  The staple line is  identified but the entire anterior side of the anastomosis appears to be  dehisced.   DESCRIPTION OF PROCEDURE:  The patient is brought to  the operating room,  placed in the supine position on operating table.  After adequate level  of general anesthesia was obtained, a Foley catheter was placed under  sterile technique.  She had been given preoperative antibiotics with  Zosyn.  A vertical midline incision was made.  Dissection was carried  down to the fascia.  Her previous fascial sutures were removed.  We were  able to dissect into the peritoneal cavity bluntly.  A large amount of  brownish fluid was encountered.  This did not appear to be foul  smelling.  The patient's abdominal wall was very tight and noncompliant.  She has had previous abdominoplasty surgery and she is also obese.  We  extended our incision inferiorly and superiorly trying to get enough  mobilization to visualize the right lower quadrant.  A Bookwalter  retractor was used for visualization.  Blunt dissection was used to  mobilize some of small bowel in the right lower quadrant.  We entered a  large abscess containing foul-smelling stool and pus.  This was washed  out and drained, blunt dissection  was used to dissect the thickened  omentum away from the small bowel.  We were able to identify some normal  small bowel in left side of the abdomen.  We slowly ran this towards the  right side.  We could also identify the transverse colon.  We used  mostly blunt dissection to mobilize the transverse colon and the small  bowel towards the midline.  Some of this the omentum was divided with  the LigaSure device.  When we were finally able to mobilize up the  anastomosis, it was obvious that the tear entire front side of the  anastomosis had dehisced.  There was free spillage of stool into the  abdomen.  We washed this out as best we could.  The small bowel was  transected with a TA-60 stapler with thick staples approximately 10 cm  proximal to the anastomosis.  The mesentery was taken down with the  LigaSure.  The transverse colon was also divided with a TA-60  stapler.  We also mobilized the mesentery with the LigaSure device.  We were able  to mobilize up what remained of the hepatic flexure and dissect this  free from the surrounding tissues.  The entire specimen was then passed  off the field and sent for pathologic examination.  We inspected the  staple line at the transverse colon.  A marking suture of 2-0 Prolene  was left at this staple line of the long Hartmann's pouch.  We then  debrided some necrotic infected appearing omentum.  We carefully  inspected and the right upper quadrant.  The liver appeared normal.  We  irrigated and suctioned the fluid that collected above the liver.  We  were able to identify the duodenum and this appeared to be inflamed but  no leak was identified.  We ran the small bowel in its entirety and the  distal ileum appeared to be inflamed as it had been secondarily  involvement in abscessed but was otherwise normal.  The remainder of the  small bowel appeared normal.  The nasogastric tube was palpated in the  stomach.  We then thoroughly irrigated the entire abdomen and pelvis  with 6 liters of warm saline.  A Jackson-Pratt drain was brought through  a stab incision in the right upper quadrant and placed in the right  pericolic gutter and the abscess cavity.  An ileostomy was created in  the right lower quadrant by removing a circle of skin and subcutaneous  fat and opening the fascia.  The fascia allowed three fingers to pass  easily.  We had to remove an additional 5 cm of terminal ileum as it  appeared rather ischemic.  We brought up the end of the ileum through  the skin and fascial opening.  We then completed our irrigation.  All  sponge, instrument, needle counts correct.  The fascia was then closed  with double-stranded #1 PDS suture in a running fashion.  Several  interrupted #1 Prolene sutures were also placed intermittently for internal retention sutures.  The subcutaneous tissues were thoroughly   irrigated.  Loose staples were placed in the skin.  Telfa wicks were  placed in between the staples.  The ileostomy was matured with  interrupted 3-0 Vicryl sutures in Brook ileostomy fashion.  An ostomy  appliance was cut to fit and was placed on the skin.  Dry dressing was  applied to the midline wound.  The patient was then extubated and  brought to recovery in stable  condition.  All sponge, instrument, and  needle counts correct.      [name] Arms. Tsuei, M.D.  Electronically Signed     MKT/MEDQ  D:  12/14/2006  T:  12/14/2006  Job:  [id number]   cc:   Ardeth Sportsman, MD  [address] [place] [address]

## 2010-11-05 NOTE — H&P (Signed)
[name], [name]                ACCOUNT NO.:  [id number]   MEDICAL RECORD NO.:  [id number]          [name] TYPE:  INP   LOCATION:  1225                         FACILITY:  [facility]   PHYSICIAN:  Wilmon Arms. Corliss Skains, M.D. DATE OF BIRTH:  1956-06-04   DATE OF ADMISSION:  12/13/2006  DATE OF DISCHARGE:                              HISTORY & PHYSICAL   CHIEF COMPLAINT:  Fever, abdominal pain, abdominal distension.   HISTORY OF PRESENT ILLNESS:  [name] is a 55 year old female who is  9 days postoperative from a laparoscopic-assisted right hemicolectomy by  Dr. [name].  [name] indication for [name] surgery was a tubulovillous  adenoma of [name] cecum with high-grade dysplasia, but no carcinoma.  [name]  [name] was apparently discharged on December 10, 2006.  She states that  when she went home she was having low-grade fever.  Since she went home,  she has had waxing and waning fevers up to 102.0.  She has also had  increasing right lower quadrant abdominal pain, as well as distension.  She is having a lot of watery diarrhea.  She also has developed white  tender spots in her mouth.  She called today, and I recommend that she  come to [name] emergency department for evaluation.   [name] also reports [name] new onset of brownish drainage from her  umbilicus.  This started yesterday.   PAST MEDICAL HISTORY:  1. Hypothyroidism.  2. Multiple sclerosis.  3. Chronic constipation.  4. A history of DVT in 1978.  5. Gastroesophageal reflux.  6. Anxiety and possible panic disorder.  7. Irritable bowel syndrome.   PAST SURGICAL HISTORY:  1. Laparoscopic cholecystectomy.  2. Total thyroidectomy.  3. Cesarean section x2.  4. Abdominoplasty.  5. Laparoscopic right hemicolectomy.   ALLERGIES:  None.   MEDICATIONS:  1. Inderal.  2. Protonix.  3. Synthroid.  4. Benicar.  5. P.R.N. Tylenol.  6. Ibuprofen.   PHYSICAL EXAMINATION:  VITAL SIGNS:  Temperature 99.5, pulse 98,  respirations 20, blood  pressure 143/82.  GENERAL:  This is a well-developed, well-nourished female in no apparent  distress.  HEENT:  [name] has some white tender spots in her mouth.  NECK:  Well-healed thyroid incision.  No masses.  LUNGS:  Clear.  HEART:  Regular rate and rhythm.  ABDOMEN:  Tender in [name] right lower quadrant.  Well-healed incisions,  except for [name] midline incision where there is some drainage at [name]  umbilicus.  EXTREMITIES:  No edema.  SKIN:  Warm, dry with no sign of jaundice.   LABORATORY DATA:  White count 16.8, hemoglobin 9.4, platelet count 547,  potassium 3.1.   X-RAYS:  Bibasilar atelectasis in [name] right lower quadrant adjacent to  [name] staple, and there is abnormal gas collection which could be  extraluminal.   IMPRESSION:  1. Postoperative wound infection.  2. Possible right lower quadrant abscess.  3. Oral thrush.  4. Diarrhea.   PLAN:  1. Will admit [name] for symptom control.  2. Rule out Clostridium difficile.  3. IV antibiotics after blood cultures.  4. Will obtain  a CT scan in [name] emergency department prior to      admission.      Wilmon Arms. Tsuei, M.D.  Electronically Signed     MKT/MEDQ  D:  12/13/2006  T:  12/14/2006  Job:  [id number]

## 2010-11-05 NOTE — Op Note (Signed)
NAMESHAKEVIA, SARRIS                ACCOUNT NO.:  [id number]   MEDICAL RECORD NO.:  [id number]          PATIENT TYPE:  INP   LOCATION:  1430                         FACILITY:  [facility]   PHYSICIAN:  Ardeth Sportsman, MD     DATE OF BIRTH:  Jul 04, 1955   DATE OF PROCEDURE:  04/28/2007  DATE OF DISCHARGE:                               OPERATIVE REPORT   PREOPERATIVE DIAGNOSIS:  Cecal polyp with high grade dysplasia status  post colectomy complicated by anastomotic leak with an ileostomy.   POSTOPERATIVE DIAGNOSIS:  Cecal polyp with high grade dysplasia status  post colectomy complicated by anastomotic leak with an ileostomy.  Paraileostomy hernia, non-incarcerated.   PROCEDURE PERFORMED:  Open lysis of adhesions x 90 minutes (almost 2/3  of the case)  Open ileostomy takedown with anastomosis.   SURGEON:  Ardeth Sportsman, M.D.   ASSISTANT:  Lennie Muckle, M.D.   ANESTHESIA:  1. General anesthesia.  2. Local anesthetic in a field block.   SPECIMENS:  Ileostomy.   DRAINS:  None.   ESTIMATED BLOOD LOSS:  250 mL.   COMPLICATIONS:  None apparent.   INDICATIONS:  Ms. Sutphen is a 55 year old female who had a large polyp in  her cecum that was not able to be removed endoscopically.  She underwent  a laparoscopically assisted right hemicolectomy that was, unfortunately,  complicated by an anastomotic leak requiring urgent exploration and  ileostomy.  She is now over four months recovered from that.  She also  developed deep venous thrombosis and pulmonary embolism, on  anticoagulation.  She has improved on her nutritional status and  function, although she has had issues of ostomy care with skin  breakdown.  Based on the improvement, she wished to have ostomy taken  down and I felt that this was reasonable since she was more than three  months out.  She is coordinated to transition over from Coumadin to  Lovenox at the time of surgery.   A discussion of open lysis of adhesions and  ostomy takedown was  discussed.  The risks such as stroke, heart attack, deep venous  thrombosis, pulmonary embolism and death were discussed.  Risks such as  bleeding, need for transfusion, wound infection, abscess, injury to  other organs, incisional hernia, prolonged pain, anastomotic leak  requiring reoperation and other risks were discussed.  Questions were  answered and she agreed to proceed.   OPERATIVE FINDINGS:  She had dense adhesions of her mid transverse colon  down her right upper quadrant retroperitoneal region to the liver.  She  had some moderate loops of small bowel adhesions in the right lower  quadrant to the retroperitoneum as well.  The left side of the abdomen,  the pelvis seemed to be free from problems.  She had a moderate size  para-ileostomy hernia but not incarcerated.  She did not have any  evidence of an incisional hernia.   DESCRIPTION OF PROCEDURE:  Informed consent was confirmed.  The patient  underwent general anesthesia without any difficulty.  She had a Foley  catheter sterilely placed.  She had  IV antibiotics given.  She could not  tolerate oral antibiotics and because she had an ileostomy should not be  require more aggressive prep.  She was positioned supine with arms out.  Her abdomen was prepped and draped in a sterile fashion.   Entry was gained through the abdomen through her prior midline incision,  starting a little superior to get in a more free area close to the  subxiphoid region.  I carefully entered into the peritoneal cavity,  swinging omentum off the midline.  Sharp dissection was done as well as  focused cautery to free the omentum off the anterior abdominal wall.  Fortunately, there were no significant small bowel adhesions to the  anterior abdominal wall.  The left side had some mild omental adhesions.  This was freed off, as well.  A wound protector was placed to protect  the incison from contamination.   Attention was turned to  identification of the colonic end.  The greater  omentum was freed off the small bowel as well as the ileostomy from the  inside and gradually reduced off.  The transverse colon mesentery could  be followed from the inferior side and the superior side until it came  down to obvious adhesions down inferior to the liver edge.  Careful  focused cautery and dissection ultimately released that and brought the  colon pouch up into the field with the Prolene stitches left by Dr.  Corliss Skains.  The tissue looked viable with no injury.  It was carefully  packed away in the right upper quadrant.   Attention was turned towards lysis of adhesions of small bowel from the  mid jejunum all the way to the ileostomy.  There were some no dense  adhesions in the right lower quadrant in the retroperitoneal region, but  I was able to free those areas off.  Careful inspection showed all the  tissues were viable with no serosal tears or injury.  There were some  interloop adhesions that were freed off, as well, down to the mesentery.  She had a very thickened mesentery but no evidence of any Crohn's.  She  had evidence of fat necrosis in between some of these loops of small  bowel but no evidence of active infection or abscess.  She also had some  evidence of fat necrosis near the RLQ chronic abscess cavity and near  where the transverse colon had been freed off, as well.  The adhesions  were not that significant in the ileostomy from the peritoneal side.   Once the adhesions had been freed off, I went ahead and took the  ileostomy down.  The protective gauze and Tegaderm was removed off.  The  ileostomy had been sutured shut by myself with 2-0 silk stitch.  Sharp  dissection was used to make a biconcave lenticular incision  transversely.  She had a moderate amount of skin breakdown laterally and  so I took some skin with that as well.  Cautery was used to help free  the subcutaneous tissues and entered into a  parastomal hernia pocket.  I  was able to circumferentially free around the ileostomy rather easily  down to the level of the peritoneum.  I was able to bring the ileostomy  into the midline wound with the wound protector.  The ileum was viable  with good healthy mesentery.  Hemostasis was good.   A side-to-side stapled anastomosis was performed using a 75 GIA stapler  and a TA stapler  to help close defect on the colon side and also take  the ileostomy, as well.  I chose a site on the small bowel about 10 cm  proximal to the ileostomy where there was healthy non-inflamed tissue.  I got a good nice antimesenteric side-to-side anastomosis with good  coverage.  The staple line was viable.  The mesenteric defect was closed  using 3-0 silk figure-of-eight interrupted stitches to a good result.  A  small defect in the greater omentum was closed, as well.  Tisseel was  placed over the staple lines of the anastomosis with good coverage.  A 3-  0 silk was placed at the more proximal end of the side-to-side  anastomosis as the classic crotch stitch.  The tissues looked viable  with no evidence of leak or abnormality.  The omentum was laid gently  over it.  The anastomosis was allowed to rest in the right upper  quadrant naturally.  The small bowel was run from ileocolonic  anastomosis to the ligament of Treitz and the small bowel laid well and  there was no twisting or turning of the mesenteric, no splaying of the  mesentery with a good result.   Copious irrigation of about 5 liters of saline was done.  Hemostasis was  excellent on the left side and the pelvis.  There was some small  retroperitoneal oozers that were easily controlled with cautery.  There  was no injury to any retroperitoneal structures.  The liver edge  initially had some oozing but hemostasis was excellent at the end.  The  midline incision was closed using #1 looped PDS x2 to a good result.  A  1/2-inch Penrose drain was placed  over the fascia after it had been  copiously irrigated with saline and staples were closed over the Penrose  drain.  A nylon stitch was used to help hold the tails of the Penrose  drain as it was brought out the superior and inferior ends.  The wound  was covered.   The fascial defect at the former ileostomy site was closed in a  transverse fashion using #1 PDS in a running fashion with a good result.  Copious irrigation of over a liter was made in the ileostomy wound and  it was closed in a similar fashion over a Penrose drain using staples to  help loosely bring things together but allow the subcutaneous tissues to  drain out through the open Penrose drain.  A sterile dressing was  applied.  The patient was extubated and taken to the recovery room in  stable condition.   I explained the operative findings to the patient's husband,  postoperative plan of recovery instructions were discussed, as well.  Questions were answered.  He expressed understanding and appreciation.      Ardeth Sportsman, MD  Electronically Signed     SCG/MEDQ  D:  04/28/2007  T:  04/28/2007  Job:  [id number]   cc:   Merlene Laughter. Renae Gloss, M.D.  Fax: [phone]   Graylin Shiver, M.D.  Fax: [phone]

## 2010-11-05 NOTE — Assessment & Plan Note (Signed)
[facility]                            CARDIOLOGY OFFICE NOTE   CADY, HAFEN                       MRN:          [id number]  DATE:05/03/2008                            DOB:          [birth year]    I was asked by Dr. Andi Devon to consult on [name], a 55-  year-old married white female, with dyspnea on exertion and problems  with labile hypertension.   HISTORY OF PRESENT ILLNESS:  [name] is a delightful 55 year old  married white female, who has carried a diagnosis of hypertension for  several years.  She has struggled with her weight, particularly lately,  since she has been diagnosed with a mild case of multiple sclerosis and  has been less active.   She has been having increased dyspnea on exertion, which is actually not  every day.  Sometimes, she walks, feels very good, and has plenty of  energy.  Other days, she walks, and she is short of breath, and has a  little bit of chest tightness.  The tightness is not consistent.  She  denies any orthopnea or PND, but she has had some peripheral edema,  which has been better lately.   She denies any tachypalpitations, presyncope, or syncope.   She has been evaluated Dr. Marcelyn Bruins in January 2009.  There was  concern about an elevated diaphragm, but her chest x-ray was normal.  There was mention of doing a VQ, but I cannot find it.  She thinks that  the reason she was having some shortness of breath then was because of  her blood pressure.   She does note that her blood pressure is fairly labile.  Sometimes, it  will be up to 170 and 180, even without exertion.  She does not measure  it with exertion.   PAST MEDICAL HISTORY:  Other than the above, she has no history of dye  allergy.   She is not allergic to any medications.   She is currently on:  1. Inderal LA 60 mg a day for tremor.  2. Synthroid 100 mcg a day with a recent TSH being elevated with      followup  scheduled.  3. Lovaza 2 b.i.d. for mixed hyperlipidemia.  4. Benicar 40/25 daily.  5. Lasix 20 mg a day.  6. Cepaxazone injection daily.  7. Provigil 200 mg p.r.n., which she states really has helped her with      her energy levels.  8. Clonazepam p.r.n.  9. Omeprazole p.r.n.   She does not smoke.  She does not use any recreational products.  She  drinks alcohol only on occasions socially.  She drinks 2 caffeinated  beverages a day.  She enjoys walking 4-5 times a week.   SURGICAL HISTORY:  1. Thyroidectomy in 1980s.  2. Cholecystectomy in 2004.  3. Polyp removed with subsequent leakage and partial colon resection      in June 2008.  4. Colon repair with ileostomy and then ileostomy reversal in November      2008.   Her family history is negative for  premature coronary artery disease.   SOCIAL HISTORY:  She is Interior and spatial designer at [employer].  She has a  very stressful job.  She works long hours.  She is married and has 3  children.  She grew up in [place].   REVIEW OF SYSTEMS:  Other than in the HPI, she has some history of  chronic anemia, chronic fatigue, occasional gastroesophageal reflux,  which is not very frequent, and a history of Hashimoto's with partial  removal of thyroid in 1980s, and again the mild case of multiple  sclerosis.   PHYSICAL EXAMINATION:  GENERAL:  Today, she is very pleasant.  VITAL SIGNS:  She is 5 feet 5 inches, weighs 231.  Her blood pressure is  114/70, which she states is remarkably good for her.  Heart rate is 80  and regular.  Her EKG is essentially normal.  There is no sign of even  LVH, if anything, low voltage.  HEENT:  Normal.  She has a slight resting tremor.  NECK:  Carotid upstrokes are equal bilaterally without bruits.  Thyroid  is not enlarged.  There is no obvious JVD.  LUNGS:  Clear to auscultation and percussion.  There are no rales or  rub.  HEART:  A poorly appreciated PMI.  She has soft S1 and S2.  No gallop.  There is no  obvious right ventricular lift.  ABDOMEN:  Soft.  Good bowel sounds.  No pulsatile mass.  No midline  bruit.  No hepatomegaly.  EXTREMITIES:  There are no cyanosis or clubbing, but there is trace  edema.  Pulses are intact.  NEUROLOGIC:  Intact.  SKIN:  Unremarkable.   ASSESSMENT:  Inconsistent dyspnea on exertion with lability of her blood  pressure.  I suspect, the two are correlated with suspected underlying  diastolic dysfunction.  Some of this is also related to deconditioning  as well as her weight.   PLAN:  1. Check TSH and free T4 per her request.  2. A 2-D echocardiogram to assess left ventricular systolic and      diastolic function, question of left ventricular hypertrophy, also      question of right ventricular function and pulmonary hypertension.   Depending on the echo findings, we may do a treadmill to assess her  heart rate and blood pressure response to exercise.  I think, by  adjusting her medications, losing some weight,  getting back into a regular exercise program, we can accomplish  improvement in her functional capacity and decrease her dyspnea on  exertion.     Thomas C. Daleen Squibb, MD, [facility]  Electronically Signed    TCW/MedQ  DD: 05/03/2008  DT: 05/03/2008  Job #: [id number]   cc:   Merlene Laughter. Renae Gloss, M.D.

## 2010-11-05 NOTE — Op Note (Signed)
[name], [name]                ACCOUNT NO.:  [id number]   MEDICAL RECORD NO.:  [id number]          PATIENT TYPE:  AMB   LOCATION:  DAY                          FACILITY:  [facility]   PHYSICIAN:  Ardeth Sportsman, MD     DATE OF BIRTH:  06/23/1956   DATE OF PROCEDURE:  DATE OF DISCHARGE:                               OPERATIVE REPORT   PRIMARY CARE PHYSICIAN:  Andi Devon.   GASTROENTEROLOGIST:  Herbert Moors, M.D. surgery.   ASSISTANT:  None.   DIAGNOSIS:  Ventral incisional hernias on upper midline incision around  old ileostomy site, incisional ventral hernias x2.   POSTOPERATIVE DIAGNOSIS:  Incarcerated ventral hernia and question  ventral hernias x4 involving all the midline incision and the right  lower quadrant ileostomy site.   PROCEDURE PERFORMED:  1. Laparoscopic lysis of adhesions x2.5 hours.  2. Laparoscopic ventral hernia repair x4 using a 20 x 34 cm and a 20 x      25 cm dual sided mesh (proceed equals ultra lightweight      polypropylene/cellulose dual-sided mass).   ANESTHESIA:  1. General anesthesia.  2. Local anesthetic in a field block around all port sites and fascial      stitches.   DRAINS:  None.   ESTIMATED BLOOD LOSS:  Fifty mL.   COMPLICATIONS:  None apparent.   INDICATIONS:  Ms. [name] is a 55 year old obese female who had a large  adenoma that required segmental resection.  Unfortunately, she developed  a postoperative abscess and an anastomotic leak requiring ileostomy.  She recovered from that and had an ileostomy takedown a little over a  year ago and did relatively well.  However, over the past year she has  noted some bulging and discomfort, especially in her upper midline  abdomen.  CT scan showed at least two ventral hernias.  Small colon was  involved in the upper midline, one in the small bowel was involved and  one in the right lower quadrant ileostomy site.   The anatomy and physiology of abdominal wall formation was discussed.  Pathophysiology of incisional herniation with risks, incarceration,  strangulation, and worsening pain and function was discussed.  Options  were discussed.  Recommendations made for laparoscopic possible open  lysis of adhesions and repair of hernias with mesh.  Risks, benefits,  alternative were discussed in detail.  Questions answered.  She agreed  to proceed.   OPERATIVE FINDINGS:  She had numerous ventral hernias.  Her largest was  12 x 12 cm circular in the subxiphoid region.  It had transverse colon  and greater omentum densely adherent within it.  Next, she had a 6.9 cm  triangular-shaped incisional hernia involving her old ileostomy site in  her right flank with small bowel incarcerated within.  She also had  Swiss cheese hernias basically from her umbilicus down to her suprapubic  region the largest two patch were 7 x 3 cm and 5 x 4 cm.  There was no  evidence of any strangulation.  There is no evidence of bowel  obstruction.  There was no evidence of  any bowel or other injury.   DESCRIPTION OF PROCEDURE:  Informed consent was confirmed.  The patient  received IV antibiotics and 2 grams IV cefazolin just prior to surgery.  She had sequential compression devices active during the entire case.  She was given subcutaneous heparin given her history of prior DVT for  which she is now completely off all anticoagulation.  She underwent  general anesthesia without any difficulty.  She was supine with both  arms tucked.  She had a Foley catheter sterilely placed.  Her abdomen  was clipped, prepped and draped in sterile fashion.   A 5-mm port was placed in the left upper quadrant using optical entry  technique with the patient steep reverse Trendelenburg and left-side up.  Camera inspection revealed no intra-abdominal injury.  I placed it more  laterally more toward the lateral clavicular line.  Under direct  visualization the camera was used to help free some soft omental  adhesions  to the anterior abdominal wall.  Enough working space was  created such that a 10 mm port could be placed in left flank and then  the left lower quadrant.   Dissection was done with primarily cold scissors as well as some gentle  blunt sweeping of intermittently soft and very dense adhesions of  omentum, small bowel and colon to the anterior abdominal wall.  She had  adhesions basically involving her entire parietal peritoneum.  Freeing  that off we were able to delineate no doubt the hernias as noted above.  Enough working space was created such that a final portion of the blade  was inserted in the right suprapubic region under visualization.   Meticulous inspection was done and there was no evidence of any bowel  injury.  I had to remove some wads of omentum that had broken down as  the omentum was densely adherent in the larger 12 x 12 cm hernia defect.  I chose a mesh as appropriate and decided to take the larger piece of  mesh and have it patch the three smaller hernias going basically from  the right upper quadrant down towards the left lower quadrant in an  oblique fashion.  I placed a total of 14 #1 Novofil fascial stitches  around its edge, rolled the mesh and unrolled it and tensioned it to the  anterior abdominal wall to make sure there was a least 5 cm of overlap  circumferentially.  There was some overlap going across the larger upper  abdominal hernia but it could not cover that region with adequate  coverage.  Therefore, I placed a 20 x 25 cm dual-sided mesh ellipsoid in  a vertical fashion.  Some of the fascial stitches did get close to the  costal ridges medially and I purposely was able to tuck the mesh up  underneath the ribs, but I did not use fascial stitches along the rib  edge nor through the ribs to secure it.   A tacker was used to help tack all rough sides of the mesh.  Note that  there was overlap in the larger obliques position mesh and the smaller   elliptical mesh and the two meshes were held together using the fascial  stitches of the smaller mesh along its left lateral inferior rims.   Copious irrigation was done with clear return.  Meticulous inspection  was done and hemostasis was excellent.  There was no evidence of any  bowel injury.  Note the 10 mm port site was  closed using #1 Novofil  stitches incorporating a bite of the edge of the mesh using a  laparoscopic suture passer under direct visualization.  I kept the  peritoneum evacuated and port was removed.  Fascial stitch was tied  down.  The skin was closed using a 4-0 Monocryl stitch.  Steri-Strips  were used to close the numerous small puncture sites to help protect the  fascia of the  anterior abdominal wall for both meshes.  An abdominal binder was  placed.  The patient was extubated and sent to recovery room in stable  condition.   I am about to discuss postoperative care with the patient's family.      Ardeth Sportsman, MD  Electronically Signed     SCG/MEDQ  D:  08/08/2008  T:  08/08/2008  Job:  [id number]   cc:   Merlene Laughter. Renae Gloss, M.D.  Fax: [phone]

## 2010-11-05 NOTE — Discharge Summary (Signed)
[name], [name]                ACCOUNT NO.:  [id number]   MEDICAL RECORD NO.:  [id number]          PATIENT TYPE:  INP   LOCATION:  1430                         FACILITY:  [facility]   PHYSICIAN:  Ardeth Sportsman, MD     DATE OF BIRTH:  [birth year]   DATE OF ADMISSION:  04/28/2007  DATE OF DISCHARGE:  05/03/2007                               DISCHARGE SUMMARY   PRIMARY CARE PHYSICIAN:  Cala Bradford R. Renae Gloss, M.D.   GASTROENTEROLOGIST:  Graylin Shiver, M.D.   PULMONOLOGIST:  Barbaraann Share, MD,FCCP.   SURGEON:  Ardeth Sportsman, M.D.   FINAL DISCHARGE DIAGNOSIS:  Cecal polyp with high-grade dysplasia,  status post a colectomy with ileostomy secondary to anastomotic leak.   PROCEDURE PERFORMED:  Lysis of adhesions and ileostomy take down with  ileocolonic anastomosis on April 28, 2007.   OTHER DIAGNOSES:  1. Postoperative deep vein thrombosis and pulmonary embolism on      chronic anticoagulation.  2. Hypertension.  3. Gastroesophageal reflux disease.  4. Hypothyroidism.  5. Multiple sclerosis.  6. Gastroesophageal reflux disease.  7. Negative stress test a year ago.  8. History of a prior deep vein thrombosis on Coumadin for 6 weeks in      1978.   HOSPITAL COURSE:  [name] is a pleasant 55 year old female who  underwent right colectomy for a polyp with high grade dysplasia  complicated by anastomotic leak and reoperation with ileostomy.  She had  a prolonged recovery but is improved.  She is now 4 months status post  that surgery.  Based on improvements, I felt it was reasonable for her  to have her ileostomy taken down.  She underwent this on April 28, 2007.  Postoperatively she had some mild anemia but it was stable.  She  started ingesting  liquids and by postoperative day #5, was having  flatus and bowel movements and tolerating a solid diet well.  She was  ambulating in the hallway well and had excellent pain control on oral  medications.  She had intermittent  episodes of nausea primarily in the  morning but they have decreased.  She denies any fevers.  She feels very  good and wishes to go home.   Based on the improvements, it is reasonable for her to be discharged  with the following instructions:  1. She is to follow up with [facility] with Shelby Dubin, PharmD, BCPS, CPP and Barbaraann Share, MD,FCCP for      transitioning back from b.i.d. Lovenox to oral Coumadin to complete      her anticoagulation therapy.  2. She should follow up with me in about 5-7 days for an incision      check and removal of staples.  3. She should shower every day, keep her incisions clean and dry.  She      can have dry dressings over her incisions as needed from her former      subcutaneous drain sites.  4. She should call if she has worsening drainage, pain, or fever, or  other concerns.  5. She should use 1% hydrocortisone cream on the right wrist, 2 x 2-cm      elevated patch that is most likely a contact allergy to her      identification wristband.  6. She should call if she has worsening fevers, chills, sweats,      nausea, vomiting, abdominal pain, wound problems, or any other      issues of concern.  7. For pain control, she can take Percocet 5/325 one to two p.o. q.4      h. p.r.n. pain, as well as Aleve 1-2 p.o. b.i.d., or ibuprofen 200      mg one to four tablets p.o. q.6 h. p.r.n. pain, Tylenol p.r.n.      pain.  8. Ice packs or heating pads p.r.n. pain.  9. Reglan 10 mg p.o. q.6 h. p.r.n. nausea.  10.Phenergan 25 mg suppositories p.r. q.6 h. p.r.n. nausea.  11.She can resume her home medications including:      a.     Inderal 60 mg p.o. q.a.m.      b.     Benicar/hydrochlorothiazide 20/12.5 p.o. q.a.m.      c.     Omeprazole 20 mg p.o. q.h.s.      d.     Synthroid 100 mcg p.o. daily q.a.m.      e.     Calcium a gram p.o. daily.      [name] as needed.      g.     Lovenox 100 mg subcutaneously b.i.d. to  transition off when       therapeutic on Coumadin.      h.     Coumadin 10 mg p.o. daily to be followed by Dr. Shelle Iron and       Shelby Dubin, PharmD, BCPS, CPP at the [facility].      Ardeth Sportsman, MD  Electronically Signed     SCG/MEDQ  D:  05/03/2007  T:  05/03/2007  Job:  [id number]   cc:   Shelby Dubin, PharmD, BCPS, CPP  [address] [place], [place] [address]   Barbaraann Share, MD,FCCP  [address]  [place]  [place] [address]   Graylin Shiver, M.D.  Fax: [phone]

## 2010-11-05 NOTE — Assessment & Plan Note (Signed)
[facility]                         GASTROENTEROLOGY OFFICE NOTE   CASI, WESTERFELD                       MRN:          [id number]  DATE:09/03/2007                            DOB:          Nov 14, 1955    CHIEF COMPLAINT:  Abdominal changes, postop abnormalities.   [name] is a 55 year old white woman who has had multiple abdominal  surgeries over the past year.  She had a large colon polyp diagnosed at  colonoscopy by Dr. Evette Cristal in 2008.  Subsequently, on December 04, 2006, she  had a tubulovillous adenoma resected laparoscopically by Dr. [name].  It was a tubulovillous adenoma of the cecum with high-grade  dysplasia.  She then suffered a complication of an anastomotic leak.  With an admission to the hospital again in June, 2008, she underwent  exploratory laparotomy, drainage of intra-abdominal abscess, and  creation of an ileostomy with a long Hartmann's pouch.  While  hospitalized, she developed deep venous thrombosis and  pulmonary  embolus and was placed on Coumadin.  Eventually, she had an open  ileostomy take-down with anastomosis and lysis of adhesions on November  5th.   Since that time, she has had some intermittent sharp pain in the right  upper quadrant to the right of the upper portion of the suture .ine  area.  She has noticed some bulging in her upper abdomen that she feels  is just not right.  She has also had a postoperative diarrhea  phenomenon.  Even when she had the ileostomy, she had a large amount of  drainage there and had to empty it every hour.  Now she takes 2-4,  sometimes a few more Imodium a day with reasonable control, and things  are getting better, but she still has diarrhea problems at times.  She  recently had an AST of 96 and an ALT of 107.  She is on Rebif  (interferon) for a mild case of multiple sclerosis.  Dr. Stoney Bang had  told her to hold her Rebif, as he thought that the transaminase  elevation may be  related to that.  She has been on that medication for  about a year.   MEDICATIONS:  Listed and reviewed in the chart.  She has been on  Synthroid, Inderal, Prilosec, Benicar, Provigil p.r.n. (rarely),  warfarin.  It looks like diphenoxylate has been used, rarely, as needed,  and Imodium 1-6 tabs a day, and the Refib 44 mcg 3 times a week, on  hold.  Furosemide is even used to p.r.n. as well.   There are no known drug allergies.   PAST MEDICAL HISTORY:  1. Hypertension.  2. History of DVT and pulmonary embolism.  3. Anxiety.  4. Multiple sclerosis.  5. Hypothyroidism.  6. Gastroesophageal reflux disease.  7. Cholecystectomy.  8. Colon polyp with surgical resection, anastomotic leak with      ileostomy and take-down, as described above.   FAMILY HISTORY:  Breast cancer in her mother.  Father had gastric  cancer.   SOCIAL HISTORY:  She has a Bachelor's Degree.  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  Rare alcohol.  No  tobacco or drugs.   REVIEW OF SYSTEMS:  Recently she had some dyspnea and significantly  elevated blood pressure with a systolic of 200 that responded (both the  dyspnea and the elevated blood pressure) to the increased dose.  That is  not a problem anymore.  She had seen Dr. Shelle Iron, and he noted that she  had an elevated hemidiaphragm.  He was going to have her do fluoroscopy  of the elevated hemidiaphragm, but with the resolution of symptoms  decided to hold off.   PHYSICAL EXAMINATION:  A well-developed and well-nourished obese white  woman.  Height 5 feet 5.  Weight 236 pounds.  Blood pressure 140/84, pulse 76.  LUNGS:  Clear.  HEART:  S1 and S2.  No murmurs, rubs or gallops.  ABDOMEN:  There is a midline scar.  There is a right lower quadrant  ileostomy scar.  When she stands, there is a little bit of a bulge in  the upper abdomen.  In the supine position, when she coughs or strains,  there is an incisional hernia on either  side of the upper portion of her  incision line.  It is easily reducible, it is soft, it is nontender.  There is no organomegaly or mass appreciated.  There is no tenderness.  PSYCH:  She is alert and oriented x3.   ASSESSMENT:  1. Incisional hernia.  2. Abnormal liver function tests, probably from her Interferon      therapy.  3. Diarrhea, which is a postoperative problem, but I wonder if she      might not have Clostridium difficile +/- bacterial overgrowth.  4. Incisional neuropathic-like pain.   PLAN:  1. At first, she is reassured.  I do not think there is any serious      problem here.  2. She will let me know the results of her abnormal liver tests.  Dr.      Stoney Bang is following those up.  3. Regarding the diarrhea, I am going to have her do a C. diff toxin.      If that is negative, we can consider things like a bile acid      sequestering, although that could prove difficult with her other      medications, particularly the warfarin.  Could consider probiotics,      but that may be an issue with the warfarin, or could consider      empiric antibiotics, which would also be an issue with the      warfarin.  If she is going to come off the warfarin, we could wait      until she does, as she may be nearing a window for that.  I will      contact her after the results of the C. diff toxin are in.  In the      meantime, she will continue to use her Imodium.  4. She is advised that if the hernia becomes more of an issue, she      should talk to Dr. Michaell Cowing      about that, but at this point, I think trying to control her weight      and just monitoring things makes the most sense.     [name], MD,FACG  Electronically Signed    CEG/MedQ  DD: 09/03/2007  DT: 09/04/2007  Job #: [id number]   cc:   Ardeth Sportsman, MD  Royetta Crochet. Karlton Lemon, M.D.

## 2010-12-02 ENCOUNTER — Telehealth: Payer: Self-pay

## 2010-12-02 MED ORDER — DOXYCYCLINE HYCLATE 100 MG PO TABS: 100.0000 mg | ORAL_TABLET | Freq: Two times a day (BID) | ORAL | Status: AC

## 2010-12-02 NOTE — Telephone Encounter (Signed)
Rx sent to preferred pharmacy per pt request. Pt informed

## 2010-12-02 NOTE — Telephone Encounter (Signed)
Ok - doxy bid x 10d - erx done but need to verify OP pharmacy before sending - thanks - OV if symptoms unimproved or worse - thanks

## 2010-12-02 NOTE — Telephone Encounter (Signed)
Per Truddie Hidden, pt called stating she had a tick bite on her neck, area is now red and swollen. Pt stated that she has had this before and was previously treated with Doxy ABX but I am unable to locate OV or RX in EPIC or EMR. Pt is requesting ABX to treat without OV if possible, please advise.

## 2011-01-01 ENCOUNTER — Other Ambulatory Visit (INDEPENDENT_AMBULATORY_CARE_PROVIDER_SITE_OTHER): Payer: Commercial Managed Care - PPO

## 2011-01-01 LAB — HEPATIC FUNCTION PANEL
Albumin: 4.5 g/dL (ref 3.5–5.2)
Total Protein: 7.8 g/dL (ref 6.0–8.3)

## 2011-01-02 ENCOUNTER — Other Ambulatory Visit: Payer: Self-pay | Admitting: Internal Medicine

## 2011-01-02 NOTE — Progress Notes (Signed)
Quick Note:  Transaminases continue to be elevated but not dangerous Will still monitor but do again in 3 months She can have a copy ______

## 2011-01-14 ENCOUNTER — Other Ambulatory Visit: Payer: Self-pay | Admitting: *Deleted

## 2011-01-14 MED ORDER — MODAFINIL 200 MG PO TABS: 200.0000 mg | ORAL_TABLET | Freq: Two times a day (BID) | ORAL | Status: DC | PRN

## 2011-01-14 NOTE — Telephone Encounter (Signed)
Faxed script back to [facility]...01/14/11@1 :56pm/LMB

## 2011-01-20 ENCOUNTER — Encounter: Payer: Self-pay | Admitting: Internal Medicine

## 2011-01-20 ENCOUNTER — Ambulatory Visit (INDEPENDENT_AMBULATORY_CARE_PROVIDER_SITE_OTHER): Payer: Commercial Managed Care - PPO | Admitting: Internal Medicine

## 2011-01-20 ENCOUNTER — Other Ambulatory Visit (INDEPENDENT_AMBULATORY_CARE_PROVIDER_SITE_OTHER): Payer: Commercial Managed Care - PPO

## 2011-01-20 VITALS — BP 118/70 | HR 69 | Temp 97.7°F | Ht 66.0 in | Wt 218.8 lb

## 2011-01-20 DIAGNOSIS — E119 Type 2 diabetes mellitus without complications: Secondary | ICD-10-CM

## 2011-01-20 DIAGNOSIS — M62830 Muscle spasm of back: Secondary | ICD-10-CM

## 2011-01-20 DIAGNOSIS — M5380 Other specified dorsopathies, site unspecified: Secondary | ICD-10-CM

## 2011-01-20 LAB — HEMOGLOBIN A1C: Hgb A1c MFr Bld: 6.1 % (ref 4.6–6.5)

## 2011-01-20 MED ORDER — NAPROXEN 500 MG PO TABS: 500.0000 mg | ORAL_TABLET | Freq: Two times a day (BID) | ORAL | Status: DC

## 2011-01-20 NOTE — Assessment & Plan Note (Signed)
Check a1c now - intol of metformin, cont januvia and amaryl on ARB

## 2011-01-20 NOTE — Patient Instructions (Signed)
It was good to see you today. Use naprosyn prescription in place of Aleve for paoin and your muscle relaxer (let us know what this is at home to conform dose and frequency) - Your prescription(s) have been submitted to your pharmacy. Please take as directed and contact our office if you believe you are having problem(s) with the medication(s). a1c for diabetes ordered today. Your results will be called to you after review (48-72hours after test completion). If any changes need to be made, you will be notified at that time. Please schedule followup in 6 months for diabetes and thyroid check, call sooner if problems.

## 2011-01-20 NOTE — Progress Notes (Signed)
  Subjective:    Patient ID: [name], female    DOB: 11/18/1955, 55 y.o.   MRN: [id number]  HPI  Here for back pain associated with lump, tender at times Located L>R side mid back, no radiation to buttock or leg or shoulder Onset symptoms >3 months ago symptoms worse with twisting and overuse Pain not improved with OTC meds and massage -  Pain occasionally improved with heat  Past Medical History  Diagnosis Date  . PULMONARY EMBOLISM 11/2006    post op  . DEEP VENOUS THROMBOPHLEBITIS 11/2006    post surg, anticoag x 6 mo  . ANXIETY DISORDER   . DIABETES MELLITUS, TYPE II   . GERD   . HYPERLIPIDEMIA   . HYPERTENSION   . NEUROPATHY   . HYPOTHYROIDISM   . MENOPAUSAL SYNDROME   . Multiple sclerosis   . SINUS TARSI SYNDROME   . IBS   . TRANSAMINASES, SERUM, ELEVATED      Review of Systems  Constitutional: Negative for fever.  Respiratory: Negative for cough.   Cardiovascular: Negative for chest pain.  Genitourinary: Negative for dysuria and hematuria.       Objective:   Physical Exam BP 118/70  Pulse 69  Temp(Src) 97.7 F (36.5 C) (Oral)  Ht 5\' 6"  (1.676 m)  Wt 218 lb 12.8 oz (99.247 kg)  BMI 35.32 kg/m2  SpO2 96%  Wt Readings from Last 3 Encounters:  01/20/11 218 lb 12.8 oz (99.247 kg)  09/05/10 217 lb (98.431 kg)  08/09/10 222 lb (100.699 kg)   Constitutional: She is overweight; oriented to person, place, and time. She appears well-developed and well-nourished. No distress.  Neck: Normal range of motion. Neck supple. No JVD present. No thyromegaly present.  Cardiovascular: Normal rate, regular rhythm and normal heart sounds.  No murmur heard. No BLE edema. Pulmonary/Chest: Effort normal and breath sounds normal. No respiratory distress. She has no wheezes.  Musculoskeletal: myofascial spasm L upper lumbar paraspinal region, no vertebral pain to palpation - full range of motion of thoracic and lumbar spine. Non tender to palpation. Negative straight leg  raise. DTR's are symmetrically intact. Sensation intact in all dermatomes of the lower extremities. Full strength to manual muscle testing; able to heel toe walk without difficulty and ambulates with a normal gait. neurovasc intact Psychiatric: She has a normal mood and affect. Her behavior is normal. Judgment and thought content normal.   Lab Results  Component Value Date   WBC 6.9 08/09/2010   HGB 13.7 08/09/2010   HCT 39.9 08/09/2010   PLT 287 08/09/2010   CHOL 189 10/21/2010   TRIG 214.0* 10/21/2010   HDL 39.80 10/21/2010   LDLDIRECT 111.6 10/21/2010   ALT 85* 01/01/2011   AST 55* 01/01/2011   NA 140 07/31/2010   K 3.9 07/31/2010   CL 100 07/31/2010   CREATININE 0.7 07/31/2010   BUN 14 07/31/2010   CO2 32 07/31/2010   TSH 3.09 09/05/2010   INR 1.02 09/14/2009   HGBA1C 6.7* 09/05/2010      Assessment & Plan:   Back pain, muscle spasm - no neuro deficits or red flags on hx - tx NSAIDs and muscle relaxers - offered PT, and pt declines at this time  Also see problem list. Medications and labs reviewed today.

## 2011-01-21 ENCOUNTER — Telehealth: Payer: Self-pay | Admitting: *Deleted

## 2011-01-21 MED ORDER — CYCLOBENZAPRINE HCL 5 MG PO TABS: 5.0000 mg | ORAL_TABLET | Freq: Three times a day (TID) | ORAL | Status: DC | PRN

## 2011-01-21 NOTE — Telephone Encounter (Signed)
Patient return call back gave md response.Marland KitchenMarland Kitchen7/31/12@3 :24pm/LMB

## 2011-01-21 NOTE — Telephone Encounter (Signed)
Pt called back to get results from labs gave her md response. She also stated md wanted to know med that Dr. Lin Givens gave her for muscle spasm. Pt states it was Lorazepam .5mg  take 1 every 12 hours as needed....01/21/11@ 10:20am/LMB

## 2011-01-21 NOTE — Telephone Encounter (Signed)
Called pt no answer LMOM RTC.Marland KitchenMarland Kitchen7/31/12@1 :24pm/LMB

## 2011-01-21 NOTE — Telephone Encounter (Signed)
i would NOT use lorazepam (which is ativan) for muscle relaxer - i have sent new erx for flexeril to use tid prn for muscle spasm - please use flexeril in addition to naprosyn as rx'd yesterday for back spasm/pain - thanks

## 2011-02-03 ENCOUNTER — Telehealth: Payer: Self-pay | Admitting: *Deleted

## 2011-02-03 DIAGNOSIS — W57XXXA Bitten or stung by nonvenomous insect and other nonvenomous arthropods, initial encounter: Secondary | ICD-10-CM

## 2011-02-03 NOTE — Telephone Encounter (Signed)
Pt was seen a while back for tick bites- in which she was prescribed Doxycyline.  Pt states she has had 5 tick bites since She has had symptoms of stiff neck and headache x 95month and a half, with flu like symptoms (just started about a week ago) and nausea. Pt denies fever She questions if she could have lymes disease. What do you advise for pt?

## 2011-02-03 NOTE — Telephone Encounter (Signed)
Informed pt .

## 2011-02-03 NOTE — Telephone Encounter (Signed)
Not likely dx with nausea but should come in for lab only visit to check for antibodies to the organism which causes lyme dz (entered into EMR) - will call once results back - no other tx recommended at this time: tylenol and hydration, OV or neuro eval if unimproved - thanks

## 2011-02-06 ENCOUNTER — Other Ambulatory Visit: Payer: Commercial Managed Care - PPO

## 2011-02-06 DIAGNOSIS — W57XXXA Bitten or stung by nonvenomous insect and other nonvenomous arthropods, initial encounter: Secondary | ICD-10-CM

## 2011-02-07 ENCOUNTER — Telehealth: Payer: Self-pay | Admitting: *Deleted

## 2011-02-07 ENCOUNTER — Other Ambulatory Visit: Payer: Self-pay | Admitting: *Deleted

## 2011-02-07 MED ORDER — GLUCOSE BLOOD VI STRP: ORAL_STRIP | Status: DC

## 2011-02-07 MED ORDER — GLIMEPIRIDE 2 MG PO TABS: ORAL_TABLET | ORAL | Status: DC

## 2011-02-07 NOTE — Telephone Encounter (Signed)
Pt is requesting labs results

## 2011-02-07 NOTE — Telephone Encounter (Signed)
Notified [name] with lab results. [name] is concern about symptoms that she is having ? After taking antibiotics website state could get a false negative. Inform pt md will give her a callback.Marland KitchenMarland KitchenMarland Kitchen8/17/12@3 :49pm/LMB

## 2011-02-07 NOTE — Telephone Encounter (Signed)
i called pt - Reviewed pt symptoms and timeline - empiric 10d doxy rx'd 12/02/10 (see phone note) due to rah at site of tick bite on neck - persisting neck and shoulder stiffness, no change in numbness - negative IgG/IgM results > i feel Lyme infx unlikely cause of symptoms - advised pt to f/u with her MS neuro or OV here for other eval as needed - back spasm as per 7/30 OV is improving per pt report, different from neck and shoulder symptoms

## 2011-02-07 NOTE — Telephone Encounter (Signed)
Test negative for lyme dz

## 2011-02-11 ENCOUNTER — Ambulatory Visit (INDEPENDENT_AMBULATORY_CARE_PROVIDER_SITE_OTHER): Payer: Commercial Managed Care - PPO | Admitting: Internal Medicine

## 2011-02-11 ENCOUNTER — Encounter: Payer: Self-pay | Admitting: Internal Medicine

## 2011-02-11 VITALS — BP 124/82 | HR 75 | Temp 98.5°F | Ht 66.0 in | Wt 214.8 lb

## 2011-02-11 DIAGNOSIS — M2550 Pain in unspecified joint: Secondary | ICD-10-CM

## 2011-02-11 DIAGNOSIS — W57XXXA Bitten or stung by nonvenomous insect and other nonvenomous arthropods, initial encounter: Secondary | ICD-10-CM

## 2011-02-11 DIAGNOSIS — T148XXA Other injury of unspecified body region, initial encounter: Secondary | ICD-10-CM

## 2011-02-11 DIAGNOSIS — A6920 Lyme disease, unspecified: Secondary | ICD-10-CM

## 2011-02-11 MED ORDER — DOXYCYCLINE HYCLATE 100 MG PO TABS: 100.0000 mg | ORAL_TABLET | Freq: Two times a day (BID) | ORAL | Status: AC

## 2011-02-11 NOTE — Patient Instructions (Signed)
It was good to see you today. Western blot today (at [facility]) and take written lab rx to [facility] for CD57 -Your results will be called to you after review (48-72hours after test completion). If any changes need to be made, you will be notified at that time. Oral doxy x 3 weeks to start after all lab tests done - Your prescription(s) have been submitted to your pharmacy. Please take as directed and contact our office if you believe you are having problem(s) with the medication(s).

## 2011-02-11 NOTE — Progress Notes (Signed)
Subjective:    Patient ID: [name], female    DOB: 25-Jan-1956, 55 y.o.   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 PULMONARY EMBOLISM 11/2006    post op  . DEEP VENOUS THROMBOPHLEBITIS 11/2006    post surg, anticoag x 6 mo  . ANXIETY DISORDER   . DIABETES MELLITUS, TYPE II   . GERD   . HYPERLIPIDEMIA   . HYPERTENSION   . NEUROPATHY   . HYPOTHYROIDISM   . MENOPAUSAL SYNDROME   . Multiple sclerosis   . SINUS TARSI SYNDROME   . IBS   . TRANSAMINASES, SERUM, ELEVATED     Review of Systems  Constitutional: Negative for fever.  Eyes: Negative for visual disturbance.  Respiratory: Negative for cough.   Cardiovascular: Negative for leg swelling.  Musculoskeletal: Negative for joint swelling.  Neurological: Positive for headaches. Negative for tremors.       Objective:   Physical Exam Wt Readings from Last 3 Encounters:  02/11/11 214 lb 12.8 oz (97.433 kg)  01/20/11 218 lb 12.8 oz (99.247 kg)  09/05/10 217 lb (98.431 kg)  BP 124/82  Pulse 75  Temp(Src) 98.5 F (36.9 C) (Oral)  Ht 5\' 6"  (1.676 m)  Wt 214 lb 12.8 oz  (97.433 kg)  BMI 34.67 kg/m2  SpO2 97% Constitutional: She is overweight. She appears well-developed and well-nourished. No distress.  HENT: Head: Normocephalic and atraumatic.  Eyes: Conjunctivae and EOM are normal. Pupils are equal, round, and reactive to light. No scleral icterus.  Neck: Normal range of motion. Neck supple. No JVD present. No thyromegaly present.  Cardiovascular: Normal rate, regular rhythm and normal heart sounds.  No murmur heard. No BLE edema. Pulmonary/Chest: Effort normal and breath sounds normal. No respiratory distress. She has no wheezes.  Abdominal: Soft, no distension. There is no tenderness. Musculoskeletal: Normal range of motion, no joint effusions. No gross deformities Neurological: She is alert and oriented to person, place, and time. No cranial nerve deficit. Coordination normal.  Skin: Skin is warm and dry. No rash noted. No erythema. nontender, nodular scar tissue approx 1cm on LLQ abdomen, no fluctuence or ulceration (prior tick bite site 10/2010) Psychiatric: She has anxious mood and depressed affect, occasionally tearful. Judgment and thought content normal.   Lab Results  Component Value Date   WBC 6.9 08/09/2010   HGB 13.7 08/09/2010   HCT 39.9 08/09/2010   PLT 287 08/09/2010   CHOL 189 10/21/2010   TRIG 214.0* 10/21/2010   HDL 39.80 10/21/2010   LDLDIRECT 111.6 10/21/2010   ALT 85* 01/01/2011   AST 55* 01/01/2011   NA 140 07/31/2010   K 3.9  07/31/2010   CL 100 07/31/2010   CREATININE 0.7 07/31/2010   BUN 14 07/31/2010   CO2 32 07/31/2010   TSH 3.09 09/05/2010   INR 1.02 09/14/2009   HGBA1C 6.1 01/20/2011      Assessment & Plan:  symptom complex including migrating arthralgias, esp neck and B shoulders;  hx tick bite with incomplete/interupted antibiotics therapy following described EM rash 11/2010 Persisting chills and sweats, nausea and unintentional loss of weight (per pt) as well as increased fatigue  Pt concerned for persisting Lyme dz or STARI -  web  pages from [email or web address] and [email or web address] brought with pt and extensively reviewed today also reviewed recent ELISA (negative) -  Will check Western blot; also offered written lab rx for CD57 testing at [facility] (as recommended by Lyme society - explained the later test is unlikely to be recognized/covered by insurance) 3 weeks oral doxy now as per pt request - erx done, to start after completion of lab testing Consider refer to Lyme literate physician depending on tests and pt symptoms/response to tx  Time spent with pt today >40 minutes, greater than 50% time spent counseling patient on lyme/STARI and symptoms /hx review. Also review of prior records and websites brought with pt today

## 2011-02-19 ENCOUNTER — Other Ambulatory Visit: Payer: Commercial Managed Care - PPO

## 2011-02-20 ENCOUNTER — Telehealth: Payer: Self-pay | Admitting: Internal Medicine

## 2011-02-20 NOTE — Telephone Encounter (Signed)
Called pt gave md response. Will continue taking antibiotic that md already rx, and if she is still having sxs after completing meds will cal back for referral..Marland Kitchen8/30/12@4 :10pm/LMB

## 2011-02-20 NOTE — Telephone Encounter (Signed)
Please let pt know i have received and reviewed her fax. At this time, i do not have medical reason to change antibiotics as she has requested, but I would be happy to refer her to [facility] clinic in [place] for opinion and tx on this if she prefers - please let me know- thanks

## 2011-02-25 ENCOUNTER — Encounter (HOSPITAL_BASED_OUTPATIENT_CLINIC_OR_DEPARTMENT_OTHER): Payer: Self-pay | Admitting: *Deleted

## 2011-02-25 ENCOUNTER — Emergency Department (HOSPITAL_BASED_OUTPATIENT_CLINIC_OR_DEPARTMENT_OTHER)
Admission: EM | Admit: 2011-02-25 | Discharge: 2011-02-26 | Disposition: A | Discharge status: Home / Self Care | Payer: 59 | Attending: Emergency Medicine | Admitting: Emergency Medicine

## 2011-02-25 DIAGNOSIS — R209 Unspecified disturbances of skin sensation: Secondary | ICD-10-CM | POA: Insufficient documentation

## 2011-02-25 DIAGNOSIS — G35 Multiple sclerosis: Secondary | ICD-10-CM | POA: Insufficient documentation

## 2011-02-25 DIAGNOSIS — E119 Type 2 diabetes mellitus without complications: Secondary | ICD-10-CM | POA: Insufficient documentation

## 2011-02-25 DIAGNOSIS — Z86718 Personal history of other venous thrombosis and embolism: Secondary | ICD-10-CM | POA: Insufficient documentation

## 2011-02-25 DIAGNOSIS — R339 Retention of urine, unspecified: Secondary | ICD-10-CM | POA: Insufficient documentation

## 2011-02-25 DIAGNOSIS — E876 Hypokalemia: Secondary | ICD-10-CM | POA: Insufficient documentation

## 2011-02-25 DIAGNOSIS — I10 Essential (primary) hypertension: Secondary | ICD-10-CM | POA: Insufficient documentation

## 2011-02-25 DIAGNOSIS — E785 Hyperlipidemia, unspecified: Secondary | ICD-10-CM | POA: Insufficient documentation

## 2011-02-25 DIAGNOSIS — M549 Dorsalgia, unspecified: Secondary | ICD-10-CM | POA: Insufficient documentation

## 2011-02-25 DIAGNOSIS — Z79899 Other long term (current) drug therapy: Secondary | ICD-10-CM | POA: Insufficient documentation

## 2011-02-25 DIAGNOSIS — R51 Headache: Secondary | ICD-10-CM | POA: Insufficient documentation

## 2011-02-25 LAB — URINALYSIS, ROUTINE W REFLEX MICROSCOPIC
Glucose, UA: NEGATIVE mg/dL
Hgb urine dipstick: NEGATIVE
Leukocytes, UA: NEGATIVE
Protein, ur: NEGATIVE mg/dL
Specific Gravity, Urine: 1.016 (ref 1.005–1.030)
Urobilinogen, UA: 0.2 mg/dL (ref 0.0–1.0)

## 2011-02-25 NOTE — ED Notes (Signed)
Pt here with multiple c/o.  Pt very concerned that she mixed up her dogs doxycycline and her own dose.  Pt also concerned about increased BP.  Pt also c/o possible urinary retention but does not feel like she has a UTI.  UA collected at triage.

## 2011-02-25 NOTE — ED Notes (Signed)
Pt states that she may have mixed her meds with her dog's meds since they are both on doxycycline at this time. Has taken doxy in the past with no problems.

## 2011-02-25 NOTE — ED Notes (Signed)
Pt has been on doxycycline for tx of lyme disease and has now developed issues with her kidneys and low back pain.

## 2011-02-25 NOTE — ED Notes (Signed)
Family at bedside. 

## 2011-02-26 ENCOUNTER — Telehealth: Payer: Self-pay | Admitting: *Deleted

## 2011-02-26 DIAGNOSIS — E876 Hypokalemia: Secondary | ICD-10-CM

## 2011-02-26 LAB — DIFFERENTIAL
Lymphocytes Relative: 35 % (ref 12–46)
Lymphs Abs: 2.7 10*3/uL (ref 0.7–4.0)
Monocytes Relative: 7 % (ref 3–12)
Neutro Abs: 4.2 10*3/uL (ref 1.7–7.7)
Neutrophils Relative %: 56 % (ref 43–77)

## 2011-02-26 LAB — CBC
MCV: 89.8 fL (ref 78.0–100.0)
Platelets: 236 10*3/uL (ref 150–400)
RBC: 4.22 MIL/uL (ref 3.87–5.11)
RDW: 13 % (ref 11.5–15.5)
WBC: 7.6 10*3/uL (ref 4.0–10.5)

## 2011-02-26 LAB — BASIC METABOLIC PANEL
BUN: 13 mg/dL (ref 6–23)
Chloride: 102 mEq/L (ref 96–112)
Glucose, Bld: 110 mg/dL — ABNORMAL HIGH (ref 70–99)
Potassium: 3 mEq/L — ABNORMAL LOW (ref 3.5–5.1)

## 2011-02-26 MED ORDER — POTASSIUM CHLORIDE CRYS ER 20 MEQ PO TBCR
40.0000 meq | EXTENDED_RELEASE_TABLET | Freq: Once | ORAL | Status: AC
  Administered 2011-02-26: 40 meq via ORAL
  Filled 2011-02-26: qty 2

## 2011-02-26 MED ORDER — POTASSIUM CHLORIDE CRYS ER 20 MEQ PO TBCR: 20.0000 meq | EXTENDED_RELEASE_TABLET | Freq: Two times a day (BID) | ORAL | Status: DC

## 2011-02-26 NOTE — ED Provider Notes (Signed)
History     CSN: [id number] Arrival date & time: 02/25/2011 11:33 PM  Chief Complaint  Patient presents with  . Urinary Retention  . Back Pain  . Medication Reaction   HPI [name] has several different complaints. She has been taking doxycycline for the last several days due to her concern of chronic lyme disease while serum tests have been sent by her PCP. She is concerned because her dog is also taking doxycycline and she is afraid she might have gotten her doses mixed with the dog's, however she states they are the same dosage and the pills are identical. She also has had back pain, headache, nasal congestion, diffuse tingling, general malaise, urinary frequency without dysuria. She noticed her BP was elevated today and so she decided to come to the ED for further eval.  Past Medical History  Diagnosis Date  . PULMONARY EMBOLISM 11/2006    post op  . DEEP VENOUS THROMBOPHLEBITIS 11/2006    post surg, anticoag x 6 mo  . ANXIETY DISORDER   . DIABETES MELLITUS, TYPE II   . GERD   . HYPERLIPIDEMIA   . HYPERTENSION   . NEUROPATHY   . HYPOTHYROIDISM   . MENOPAUSAL SYNDROME   . Multiple sclerosis   . SINUS TARSI SYNDROME   . IBS   . TRANSAMINASES, SERUM, ELEVATED     Past Surgical History  Procedure Date  . Laraoscopic cecal polyp resection 11/2006    Right  . Ex lap and ileostomy/hartmanns pouch for anatamotic leak 11/2006  . Ileostomy reversal 04/2007  . Cholecystectomy   . Cesarean section     x's 2  . Abdominoplasty   . Total thyroidectomy 1990    Family History  Problem Relation Age of Onset  . Breast cancer Mother   . Diabetes Maternal Grandmother   . Diabetes Paternal Grandmother     History  Substance Use Topics  . Smoking status: Never Smoker   . Smokeless tobacco: Not on file  . Alcohol Use: No    OB History    Grav Para Term Preterm Abortions TAB SAB Ect Mult Living                  Review of Systems All other systems reviewed and are negative  except as noted in HPI.   Physical Exam  BP 158/76  Pulse 65  Temp(Src) 98.2 F (36.8 C) (Oral)  Resp 18  Ht 5\' 5"  (1.651 m)  Wt 214 lb (97.07 kg)  BMI 35.61 kg/m2  SpO2 100%  Physical Exam  Nursing note and vitals reviewed. Constitutional: She is oriented to person, place, and time. She appears well-developed and well-nourished.  HENT:  Head: Normocephalic and atraumatic.  Eyes: EOM are normal. Pupils are equal, round, and reactive to light.  Neck: Normal range of motion. Neck supple.  Cardiovascular: Normal rate, normal heart sounds and intact distal pulses.   Pulmonary/Chest: Effort normal and breath sounds normal.  Abdominal: Bowel sounds are normal. She exhibits no distension. There is no tenderness.  Musculoskeletal: Normal range of motion. She exhibits no edema and no tenderness.  Neurological: She is alert and oriented to person, place, and time. No cranial nerve deficit.  Skin: Skin is warm and dry. No rash noted.  Psychiatric: She has a normal mood and affect.    ED Course  Procedures  MDM Explained to the patient that her symptoms were varied and unlikely to be all due to one disease process. Offered to  check basic labs and plan on followup with PCP for further eval if neg. Pt amenable to this plan.   2:33 AM Mild hypokalemia, but otherwise normal labs. Will give K supplement and advised PCP followup if not feeling better.    Charles B. Bernette Mayers, MD 02/26/11 [phone]

## 2011-02-26 NOTE — Telephone Encounter (Signed)
Noted- yes, i put future order for K recheck to be drawn with LFTs at upcoming OV/labs with GI - thanks

## 2011-02-26 NOTE — Telephone Encounter (Signed)
Called pt to inform her of results from Lyme disease test. Pt wanted to let md know that she had to go to ER last night. Thought she was having allergic reaction to doxcycline. Was having some face tightness & numbness. Was told that her K was low. Started taking K over the counter. Pt states will complete antibiotic & k still not feeling 100% better. Coming in to have labs done for Dr. Leone Payor in Oct should she have K check then. If she doesn't feel better will call back to make f/u appt with Dr. Felicity Coyer.

## 2011-02-27 ENCOUNTER — Other Ambulatory Visit: Payer: Self-pay | Admitting: Internal Medicine

## 2011-02-28 ENCOUNTER — Other Ambulatory Visit: Payer: Self-pay | Admitting: Gastroenterology

## 2011-02-28 MED ORDER — DIPHENOXYLATE-ATROPINE 2.5-0.025 MG PO TABS: ORAL_TABLET | ORAL | Status: DC

## 2011-02-28 NOTE — Telephone Encounter (Signed)
The prescription sent in yesterday printed so medication was reordered electronically and sent to [facility].

## 2011-03-03 ENCOUNTER — Encounter: Payer: Self-pay | Admitting: Internal Medicine

## 2011-04-01 ENCOUNTER — Telehealth: Payer: Self-pay | Admitting: Internal Medicine

## 2011-04-01 LAB — COMPREHENSIVE METABOLIC PANEL
ALT: 128 — ABNORMAL HIGH
Albumin: 4.1
Alkaline Phosphatase: 111
BUN: 9
Chloride: 105
Glucose, Bld: 106 — ABNORMAL HIGH
Potassium: 4.3
Total Bilirubin: 1.1

## 2011-04-01 LAB — CBC
HCT: 29 — ABNORMAL LOW
Hemoglobin: 11.1 — ABNORMAL LOW
Hemoglobin: 9.9 — ABNORMAL LOW
MCHC: 34.3
MCV: 94.2
MCV: 94.2
Platelets: 246
RBC: 3.07 — ABNORMAL LOW
RDW: 14.2 — ABNORMAL HIGH
WBC: 8.4

## 2011-04-01 LAB — BASIC METABOLIC PANEL
CO2: 21
Calcium: 8.3 — ABNORMAL LOW
Chloride: 104
Glucose, Bld: 133 — ABNORMAL HIGH
Sodium: 135

## 2011-04-01 LAB — CREATININE, SERUM: Creatinine, Ser: 0.64

## 2011-04-01 LAB — HEMOGLOBIN AND HEMATOCRIT, BLOOD
HCT: 36.3
Hemoglobin: 12.6

## 2011-04-01 LAB — POTASSIUM: Potassium: 3.9

## 2011-04-01 NOTE — Telephone Encounter (Signed)
Hepatic Patient is due for hepatic according to labs in July q 3 months

## 2011-04-07 LAB — DIFFERENTIAL
Basophils Absolute: 0
Basophils Relative: 1
Eosinophils Absolute: 0.2
Eosinophils Relative: 3
Monocytes Absolute: 0.6
Monocytes Relative: 11

## 2011-04-07 LAB — CBC
HCT: 35.3 — ABNORMAL LOW
Hemoglobin: 12
MCHC: 34
MCV: 87
RBC: 4.06
RDW: 15.4 — ABNORMAL HIGH

## 2011-04-08 LAB — BASIC METABOLIC PANEL
BUN: 11
BUN: 9
CO2: 26
Calcium: 8.6
Creatinine, Ser: 0.52
GFR calc Af Amer: >60
GFR calc non Af Amer: >60
GFR calc non Af Amer: >60
GFR calc non Af Amer: >60
GFR calc non Af Amer: >60
Glucose, Bld: 141 — ABNORMAL HIGH
Glucose, Bld: 146 — ABNORMAL HIGH
Potassium: 3.9
Potassium: 3.9
Potassium: 4.2
Sodium: 134 — ABNORMAL LOW
Sodium: 137
Sodium: 138

## 2011-04-08 LAB — COMPREHENSIVE METABOLIC PANEL
ALT: 46 — ABNORMAL HIGH
AST: 23
AST: 26
Albumin: 2.3 — ABNORMAL LOW
Albumin: 2.5 — ABNORMAL LOW
Alkaline Phosphatase: 136 — ABNORMAL HIGH
Alkaline Phosphatase: 174 — ABNORMAL HIGH
BUN: 12
CO2: 24
Calcium: 8.6
Chloride: 106
Chloride: 106
GFR calc Af Amer: >60
GFR calc Af Amer: >60
GFR calc non Af Amer: >60
Glucose, Bld: 118 — ABNORMAL HIGH
Potassium: 4
Potassium: 4.1
Sodium: 138
Sodium: 138
Total Bilirubin: 0.6
Total Bilirubin: 0.8
Total Protein: 6.1
Total Protein: 6.7

## 2011-04-08 LAB — CBC
HCT: 32.9 — ABNORMAL LOW
HCT: 34.2 — ABNORMAL LOW
Hemoglobin: 11.3 — ABNORMAL LOW
MCHC: 33.8
MCHC: 34.5
MCV: 87.2
Platelets: 500 — ABNORMAL HIGH
Platelets: 522 — ABNORMAL HIGH
Platelets: 580 — ABNORMAL HIGH
Platelets: 608 — ABNORMAL HIGH
RBC: 3.77 — ABNORMAL LOW
RDW: 14.8 — ABNORMAL HIGH
RDW: 15.1 — ABNORMAL HIGH
RDW: 15.2 — ABNORMAL HIGH
WBC: 8.3
WBC: 9
WBC: 9.4

## 2011-04-08 LAB — URINALYSIS, ROUTINE W REFLEX MICROSCOPIC
Glucose, UA: NEGATIVE
Ketones, ur: NEGATIVE
Leukocytes, UA: NEGATIVE
Nitrite: NEGATIVE
Specific Gravity, Urine: 1.012
pH: 7

## 2011-04-08 LAB — PROTIME-INR
INR: 1
Prothrombin Time: 13.6

## 2011-04-08 LAB — PHOSPHORUS: Phosphorus: 4.3

## 2011-04-08 LAB — URINE MICROSCOPIC-ADD ON

## 2011-04-08 LAB — DIFFERENTIAL
Basophils Absolute: 0
Basophils Relative: 0
Eosinophils Relative: 5
Monocytes Absolute: 0.8 — ABNORMAL HIGH
Monocytes Relative: 10
Neutro Abs: 6.2

## 2011-04-08 LAB — ANTIPHOSPHOLIPID SYNDROME EVAL, BLD
Anticardiolipin IgG: <7 — ABNORMAL LOW (ref <11)
DRVVT: 48.3 — ABNORMAL HIGH (ref 36.1–47.0)
Lupus Anticoagulant: NOT DETECTED
PTTLA 4:1 Mix: 45.6 (ref 36.3–48.8)
dRVVT Incubated 1:1 Mix: 39.9 (ref 36.1–47.0)

## 2011-04-08 LAB — TRIGLYCERIDES: Triglycerides: 208 — ABNORMAL HIGH

## 2011-04-08 LAB — MAGNESIUM
Magnesium: 1.9
Magnesium: 2.1

## 2011-04-08 LAB — PREALBUMIN
Prealbumin: 22.1
Prealbumin: 26.7

## 2011-04-08 LAB — HEPARIN LEVEL (UNFRACTIONATED)
Heparin Unfractionated: 0.15 — ABNORMAL LOW
Heparin Unfractionated: 0.58
Heparin Unfractionated: 0.6

## 2011-04-08 LAB — PROTEIN S, TOTAL: Protein S Ag, Total: 122 % (ref 70–140)

## 2011-04-09 LAB — COMPREHENSIVE METABOLIC PANEL
ALT: 16
ALT: 19
ALT: 28
ALT: 51 — ABNORMAL HIGH
AST: 26
AST: 41 — ABNORMAL HIGH
Albumin: 2 — ABNORMAL LOW
Albumin: <1 — ABNORMAL LOW
Alkaline Phosphatase: 53
Alkaline Phosphatase: 91
Alkaline Phosphatase: 92
BUN: 5 — ABNORMAL LOW
CO2: 26
CO2: 28
CO2: 29
Calcium: 7 — ABNORMAL LOW
Chloride: 103
Chloride: 98
Creatinine, Ser: 0.56
GFR calc Af Amer: 56 — ABNORMAL LOW
GFR calc non Af Amer: 46 — ABNORMAL LOW
GFR calc non Af Amer: >60
Glucose, Bld: 107 — ABNORMAL HIGH
Glucose, Bld: 145 — ABNORMAL HIGH
Glucose, Bld: 149 — ABNORMAL HIGH
Glucose, Bld: 149 — ABNORMAL HIGH
Potassium: 3.1 — ABNORMAL LOW
Potassium: 3.9
Potassium: 4
Sodium: 136
Sodium: 136
Sodium: 136
Sodium: 137
Sodium: 138
Total Bilirubin: 0.5
Total Bilirubin: 0.5
Total Bilirubin: 0.5
Total Protein: 3.8 — ABNORMAL LOW
Total Protein: 5.3 — ABNORMAL LOW
Total Protein: 5.7 — ABNORMAL LOW

## 2011-04-09 LAB — CULTURE, BLOOD (ROUTINE X 2): Culture: NO GROWTH

## 2011-04-09 LAB — URINALYSIS, ROUTINE W REFLEX MICROSCOPIC
Bilirubin Urine: NEGATIVE
Glucose, UA: NEGATIVE
Glucose, UA: NEGATIVE
Hgb urine dipstick: NEGATIVE
Ketones, ur: NEGATIVE
Protein, ur: NEGATIVE
Protein, ur: NEGATIVE
Specific Gravity, Urine: 1.021
Urobilinogen, UA: 0.2
Urobilinogen, UA: 1
pH: 6

## 2011-04-09 LAB — CBC
HCT: 28.5 — ABNORMAL LOW
HCT: 29.4 — ABNORMAL LOW
HCT: 31.8 — ABNORMAL LOW
HCT: 31.9 — ABNORMAL LOW
HCT: 36
Hemoglobin: 10.7 — ABNORMAL LOW
Hemoglobin: 10.8 — ABNORMAL LOW
Hemoglobin: 10.9 — ABNORMAL LOW
Hemoglobin: 12
Hemoglobin: 9.4 — ABNORMAL LOW
Hemoglobin: 9.6 — ABNORMAL LOW
MCHC: 33.2
MCHC: 33.4
MCHC: 33.5
MCHC: 34
MCV: 87.6
MCV: 87.7
MCV: 87.9
MCV: 88.5
MCV: 88.6
MCV: 89.2
Platelets: 260
Platelets: 475 — ABNORMAL HIGH
Platelets: 517 — ABNORMAL HIGH
Platelets: 528 — ABNORMAL HIGH
Platelets: 553 — ABNORMAL HIGH
Platelets: 622 — ABNORMAL HIGH
RBC: 3.03 — ABNORMAL LOW
RBC: 3.24 — ABNORMAL LOW
RBC: 3.3 — ABNORMAL LOW
RBC: 3.53 — ABNORMAL LOW
RDW: 14.1 — ABNORMAL HIGH
RDW: 14.8 — ABNORMAL HIGH
RDW: 14.8 — ABNORMAL HIGH
RDW: 15 — ABNORMAL HIGH
RDW: 15.2 — ABNORMAL HIGH
RDW: 15.2 — ABNORMAL HIGH
RDW: 15.3 — ABNORMAL HIGH
RDW: 15.4 — ABNORMAL HIGH
RDW: 15.5 — ABNORMAL HIGH
WBC: 13.2 — ABNORMAL HIGH
WBC: 15.3 — ABNORMAL HIGH
WBC: 16.8 — ABNORMAL HIGH
WBC: 19.8 — ABNORMAL HIGH
WBC: 21.7 — ABNORMAL HIGH
WBC: 22.6 — ABNORMAL HIGH
WBC: 8.7

## 2011-04-09 LAB — URINE MICROSCOPIC-ADD ON

## 2011-04-09 LAB — ANAEROBIC CULTURE

## 2011-04-09 LAB — MAGNESIUM
Magnesium: 1.4 — ABNORMAL LOW
Magnesium: 1.9
Magnesium: 2
Magnesium: 2

## 2011-04-09 LAB — BASIC METABOLIC PANEL
BUN: 10
BUN: 3 — ABNORMAL LOW
BUN: 5 — ABNORMAL LOW
BUN: 6
CO2: 27
Calcium: 6.1 — CL
Calcium: 6.2 — CL
Calcium: 8.4
Chloride: 102
Chloride: 103
Chloride: 104
Creatinine, Ser: 0.52
Creatinine, Ser: 0.52
Creatinine, Ser: 0.7
Creatinine, Ser: 0.75
GFR calc Af Amer: >60
GFR calc Af Amer: >60
GFR calc non Af Amer: >60
GFR calc non Af Amer: >60
GFR calc non Af Amer: >60
GFR calc non Af Amer: >60
Glucose, Bld: 118 — ABNORMAL HIGH
Glucose, Bld: 128 — ABNORMAL HIGH
Glucose, Bld: 139 — ABNORMAL HIGH
Potassium: 3.9
Sodium: 135

## 2011-04-09 LAB — DIFFERENTIAL
Basophils Absolute: 0.1
Basophils Relative: 0
Basophils Relative: 1
Eosinophils Absolute: 0.2
Eosinophils Absolute: 0.2
Eosinophils Relative: 1
Eosinophils Relative: 2
Lymphocytes Relative: 5 — ABNORMAL LOW
Monocytes Absolute: 0.9 — ABNORMAL HIGH
Monocytes Absolute: 1.5 — ABNORMAL HIGH
Monocytes Relative: 8
Monocytes Relative: 9
Neutrophils Relative %: 83 — ABNORMAL HIGH
Neutrophils Relative %: 84 — ABNORMAL HIGH

## 2011-04-09 LAB — CROSSMATCH
ABO/RH(D): O POS
Antibody Screen: NEGATIVE

## 2011-04-09 LAB — URINE CULTURE
Colony Count: 50000
Colony Count: NO GROWTH
Special Requests: NEGATIVE
Special Requests: NEGATIVE

## 2011-04-09 LAB — BODY FLUID CULTURE

## 2011-04-09 LAB — APTT
aPTT: 25
aPTT: 27

## 2011-04-09 LAB — PROTIME-INR
INR: 1.3
Prothrombin Time: 16.2 — ABNORMAL HIGH

## 2011-04-09 LAB — TYPE AND SCREEN

## 2011-04-09 LAB — POTASSIUM
Potassium: 3.6
Potassium: 3.7

## 2011-04-09 LAB — PREALBUMIN: Prealbumin: 11.9 — ABNORMAL LOW

## 2011-04-09 LAB — CHOLESTEROL, TOTAL: Cholesterol: 103

## 2011-04-09 LAB — ABO/RH: ABO/RH(D): O POS

## 2011-04-09 LAB — CREATININE, SERUM: Creatinine, Ser: 1

## 2011-04-09 LAB — CK: Total CK: 45

## 2011-04-09 LAB — TRIGLYCERIDES
Triglycerides: 103
Triglycerides: 125

## 2011-04-10 LAB — BASIC METABOLIC PANEL
BUN: 4 — ABNORMAL LOW
BUN: 8
CO2: 30
Calcium: 8.3 — ABNORMAL LOW
Calcium: 9.5
Chloride: 105
Creatinine, Ser: 0.69
GFR calc non Af Amer: >60
Glucose, Bld: 139 — ABNORMAL HIGH

## 2011-04-10 LAB — CBC
HCT: 36.7
Hemoglobin: 12.3
MCHC: 33.5
Platelets: 253
Platelets: 328
RDW: 13.8
RDW: 14

## 2011-04-10 LAB — URINALYSIS, ROUTINE W REFLEX MICROSCOPIC
Glucose, UA: NEGATIVE
Ketones, ur: NEGATIVE
Nitrite: NEGATIVE
Nitrite: NEGATIVE
Specific Gravity, Urine: 1.014
Urobilinogen, UA: 0.2
pH: 7

## 2011-04-10 LAB — PREGNANCY, URINE: Preg Test, Ur: NEGATIVE

## 2011-04-10 LAB — URINE MICROSCOPIC-ADD ON

## 2011-04-10 LAB — HEMOGLOBIN AND HEMATOCRIT, BLOOD: Hemoglobin: 12.2

## 2011-04-28 ENCOUNTER — Other Ambulatory Visit (INDEPENDENT_AMBULATORY_CARE_PROVIDER_SITE_OTHER): Payer: Commercial Managed Care - PPO

## 2011-04-28 LAB — HEPATIC FUNCTION PANEL
Bilirubin, Direct: 0.1 mg/dL (ref 0.0–0.3)
Total Bilirubin: 0.8 mg/dL (ref 0.3–1.2)
Total Protein: 7.8 g/dL (ref 6.0–8.3)

## 2011-04-30 ENCOUNTER — Other Ambulatory Visit: Payer: Self-pay

## 2011-04-30 NOTE — Progress Notes (Signed)
Quick Note:  These remain mildly abnormal but overall stable. Is another MD looking at these? Do we need to forward? Can give her a copy Can repeat them in 4 months ______

## 2011-05-28 ENCOUNTER — Other Ambulatory Visit: Payer: Self-pay | Admitting: *Deleted

## 2011-05-28 ENCOUNTER — Other Ambulatory Visit: Payer: Self-pay | Admitting: Internal Medicine

## 2011-05-28 MED ORDER — CLONAZEPAM 0.5 MG PO TABS: 0.5000 mg | ORAL_TABLET | Freq: Three times a day (TID) | ORAL | Status: DC | PRN

## 2011-05-28 NOTE — Telephone Encounter (Signed)
Faxed script back to [facility] @ [phone]...05/28/11@1 :27pm/LMB

## 2011-07-01 ENCOUNTER — Other Ambulatory Visit: Payer: Self-pay | Admitting: Internal Medicine

## 2011-07-16 ENCOUNTER — Other Ambulatory Visit (INDEPENDENT_AMBULATORY_CARE_PROVIDER_SITE_OTHER): Payer: 59

## 2011-07-16 ENCOUNTER — Ambulatory Visit (INDEPENDENT_AMBULATORY_CARE_PROVIDER_SITE_OTHER): Payer: 59 | Admitting: Internal Medicine

## 2011-07-16 ENCOUNTER — Encounter: Payer: Self-pay | Admitting: Internal Medicine

## 2011-07-16 VITALS — BP 112/72 | HR 74 | Temp 97.3°F | Wt 227.4 lb

## 2011-07-16 DIAGNOSIS — E039 Hypothyroidism, unspecified: Secondary | ICD-10-CM

## 2011-07-16 DIAGNOSIS — E049 Nontoxic goiter, unspecified: Secondary | ICD-10-CM

## 2011-07-16 DIAGNOSIS — E063 Autoimmune thyroiditis: Secondary | ICD-10-CM

## 2011-07-16 DIAGNOSIS — M6740 Ganglion, unspecified site: Secondary | ICD-10-CM

## 2011-07-16 NOTE — Progress Notes (Signed)
  Subjective:    Patient ID: [name], female    DOB: [birth year], 56 y.o.   MRN: [id number]  HPI  Here for follow up - request thyroid check Increase swelling on L side of neck in last 2 weeks - No swallowing or fever or pain in neck  Past Medical History  Diagnosis Date  . PULMONARY EMBOLISM 11/2006    post op  . DEEP VENOUS THROMBOPHLEBITIS 11/2006    post surg, anticoag x 6 mo  . ANXIETY DISORDER   . DIABETES MELLITUS, TYPE II   . GERD   . HYPERLIPIDEMIA   . HYPERTENSION   . NEUROPATHY   . HYPOTHYROIDISM   . MENOPAUSAL SYNDROME   . Multiple sclerosis   . SINUS TARSI SYNDROME   . IBS   . TRANSAMINASES, SERUM, ELEVATED     Review of Systems  Respiratory: Negative for shortness of breath and stridor.   Cardiovascular: Negative for chest pain and leg swelling.       Objective:   Physical Exam BP 112/72  Pulse 74  Temp(Src) 97.3 F (36.3 C) (Oral)  Wt 227 lb 6.4 oz (103.148 kg)  SpO2 98% Wt Readings from Last 3 Encounters:  07/16/11 227 lb 6.4 oz (103.148 kg)  02/25/11 214 lb (97.07 kg)  02/11/11 214 lb 12.8 oz (97.433 kg)   Constitutional: She is overweight, but appears well-developed and well-nourished. No distress.  Neck: Normal range of motion. Neck supple. L sided thyromegaly present. s/p r thyroidectomy remote Skin: Skin is warm and dry. No rash noted. No erythema.  Psychiatric: She has a normal mood and affect. Her behavior is normal. Judgment and thought content normal.   Lab Results  Component Value Date   WBC 7.6 02/26/2011   HGB 13.2 02/26/2011   HCT 37.9 02/26/2011   PLT 236 02/26/2011   GLUCOSE 110* 02/26/2011   CHOL 189 10/21/2010   TRIG 214.0* 10/21/2010   HDL 39.80 10/21/2010   LDLDIRECT 111.6 10/21/2010   LDLCALC 85 05/21/2010   ALT 94* 04/28/2011   AST 67* 04/28/2011   NA 142 02/26/2011   K 3.0* 02/26/2011   CL 102 02/26/2011   CREATININE 0.70 02/26/2011   BUN 13 02/26/2011   CO2 28 02/26/2011   TSH 3.09 09/05/2010   INR 1.02 09/14/2009   HGBA1C 6.1  01/20/2011        Assessment & Plan:  L side goiter - acute onset (<2 weeks) Nonpainful, nontoxic hx Hashimoto's in 1980s - treated with r thyroidectomy for same, remote Check labs now and [place], consider other testing or endo eval as needed

## 2011-07-16 NOTE — Patient Instructions (Signed)
It was good to see you today. Test(s) and thyroid ultrasound ordered today. Your results will be called to you after review (48-72hours after test completion). If any changes need to be made, you will be notified at that time. The flexor tendon (palm) cyst is benign - if locking/popping or other problems, let us know and we will refer to hand specialist as needed

## 2011-07-16 NOTE — Assessment & Plan Note (Signed)
Lab Results  Component Value Date   TSH 3.09 09/05/2010  check TFTs now given new goiter -

## 2011-07-17 LAB — T4, FREE: Free T4: 0.75 ng/dL (ref 0.60–1.60)

## 2011-07-17 LAB — THYROID PEROXIDASE ANTIBODY: Thyroperoxidase Ab SerPl-aCnc: 167 IU/mL — ABNORMAL HIGH (ref <35.0)

## 2011-07-21 ENCOUNTER — Ambulatory Visit
Admission: RE | Admit: 2011-07-21 | Discharge: 2011-07-21 | Disposition: A | Discharge status: Home / Self Care | Payer: 59 | Source: Ambulatory Visit | Attending: Internal Medicine | Admitting: Internal Medicine

## 2011-07-21 DIAGNOSIS — E039 Hypothyroidism, unspecified: Secondary | ICD-10-CM

## 2011-07-21 DIAGNOSIS — E041 Nontoxic single thyroid nodule: Secondary | ICD-10-CM | POA: Insufficient documentation

## 2011-07-22 ENCOUNTER — Other Ambulatory Visit: Payer: Self-pay | Admitting: Internal Medicine

## 2011-07-22 DIAGNOSIS — E041 Nontoxic single thyroid nodule: Secondary | ICD-10-CM

## 2011-07-23 ENCOUNTER — Other Ambulatory Visit (HOSPITAL_COMMUNITY)
Admission: RE | Admit: 2011-07-23 | Discharge: 2011-07-23 | Disposition: A | Discharge status: Home / Self Care | Payer: 59 | Source: Ambulatory Visit | Attending: Diagnostic Radiology | Admitting: Diagnostic Radiology

## 2011-07-23 ENCOUNTER — Ambulatory Visit
Admission: RE | Admit: 2011-07-23 | Discharge: 2011-07-23 | Disposition: A | Discharge status: Home / Self Care | Payer: 59 | Source: Ambulatory Visit | Attending: Internal Medicine | Admitting: Internal Medicine

## 2011-07-23 DIAGNOSIS — E041 Nontoxic single thyroid nodule: Secondary | ICD-10-CM

## 2011-08-04 ENCOUNTER — Other Ambulatory Visit: Payer: Self-pay | Admitting: Internal Medicine

## 2011-09-16 ENCOUNTER — Encounter: Payer: Self-pay | Admitting: Internal Medicine

## 2011-09-29 ENCOUNTER — Other Ambulatory Visit: Payer: Self-pay | Admitting: *Deleted

## 2011-09-29 MED ORDER — PROPRANOLOL HCL ER 120 MG PO CP24: 120.0000 mg | ORAL_CAPSULE | Freq: Every day | ORAL | Status: DC

## 2011-10-03 ENCOUNTER — Other Ambulatory Visit: Payer: Self-pay | Admitting: *Deleted

## 2011-10-03 MED ORDER — MODAFINIL 200 MG PO TABS: 200.0000 mg | ORAL_TABLET | Freq: Two times a day (BID) | ORAL | Status: DC | PRN

## 2011-10-03 MED ORDER — FUROSEMIDE 40 MG PO TABS: 40.0000 mg | ORAL_TABLET | Freq: Every day | ORAL | Status: DC

## 2011-10-03 NOTE — Telephone Encounter (Signed)
Faxed script for provigil to [facility].. 10/03/11@1 :22pm/LMB

## 2011-10-03 NOTE — Telephone Encounter (Signed)
MD is out of office- Dr. Jonny Ruiz is this ok to refill.Marland KitchenMarland KitchenMarland Kitchen4/12/13@9 :07am/LMB

## 2011-10-03 NOTE — Telephone Encounter (Signed)
Done hardcopy to robin  

## 2011-10-07 ENCOUNTER — Telehealth: Payer: Self-pay | Admitting: Internal Medicine

## 2011-10-07 NOTE — Telephone Encounter (Signed)
Dr Leone Payor she is asking to have recall colon at hospital.  Is this ok?

## 2011-10-07 NOTE — Telephone Encounter (Signed)
Left message for patient to call back  

## 2011-10-07 NOTE — Telephone Encounter (Signed)
Yes but not on the Monday of hospital week. Check with her re: would she need propofol? - seems like it was ok before but let her know that is possible

## 2011-10-08 ENCOUNTER — Other Ambulatory Visit: Payer: Self-pay

## 2011-10-08 ENCOUNTER — Telehealth: Payer: Self-pay | Admitting: Internal Medicine

## 2011-10-08 ENCOUNTER — Other Ambulatory Visit: Payer: Self-pay | Admitting: Internal Medicine

## 2011-10-08 DIAGNOSIS — Z1231 Encounter for screening mammogram for malignant neoplasm of breast: Secondary | ICD-10-CM

## 2011-10-08 NOTE — Telephone Encounter (Signed)
Patient is scheduled for 10/06/11 9:00 [facility].

## 2011-10-15 LAB — HM PAP SMEAR: HM Pap smear: NEGATIVE

## 2011-10-22 ENCOUNTER — Ambulatory Visit
Admission: RE | Admit: 2011-10-22 | Discharge: 2011-10-22 | Disposition: A | Discharge status: Home / Self Care | Payer: 59 | Source: Ambulatory Visit | Attending: Internal Medicine | Admitting: Internal Medicine

## 2011-10-22 DIAGNOSIS — Z1231 Encounter for screening mammogram for malignant neoplasm of breast: Secondary | ICD-10-CM

## 2011-10-26 LAB — HM COLONOSCOPY

## 2011-10-29 ENCOUNTER — Ambulatory Visit (AMBULATORY_SURGERY_CENTER): Payer: 59 | Admitting: *Deleted

## 2011-10-29 VITALS — Ht 65.0 in | Wt 226.7 lb

## 2011-10-29 DIAGNOSIS — Z86010 Personal history of colonic polyps: Secondary | ICD-10-CM

## 2011-10-29 DIAGNOSIS — Z1211 Encounter for screening for malignant neoplasm of colon: Secondary | ICD-10-CM

## 2011-10-29 MED ORDER — BISACODYL-PEG-KCL-NABICAR-NACL 5-210 MG-GM PO KIT: PACK | ORAL | Status: DC

## 2011-10-29 MED ORDER — METOCLOPRAMIDE HCL 10 MG PO TABS: ORAL_TABLET | ORAL | Status: DC

## 2011-10-29 MED ORDER — POLYETHYLENE GLYCOL 3350 17 GM/SCOOP PO POWD: ORAL | Status: DC

## 2011-10-29 MED ORDER — PEG-KCL-NACL-NASULF-NA ASC-C 100 G PO SOLR: 1.0000 | Freq: Once | ORAL | Status: DC

## 2011-11-03 ENCOUNTER — Telehealth: Payer: Self-pay | Admitting: *Deleted

## 2011-11-03 MED ORDER — BISACODYL 5 MG PO TBEC: DELAYED_RELEASE_TABLET | ORAL | Status: DC

## 2011-11-03 NOTE — Telephone Encounter (Signed)
Pt called with questions re: prep.  She states she is confused about what her daughter picked up for her at the pharmacy for her prep.  She states, " I have Polyethelene Glycol, my Reglan, and another box that says half-lytely with Bisacodyl."  Writer attempts to clarify what pt has and tells her to open up the half-lytely RX.  Pt states, "I was supposed to have another prep but changed to this Miralax prep."  Writer reads through her instructions and determines that pt doesn't need the half-lytely rx but just needs Bisacodyl 4 tablets.  Pt states, "Well, that was $25 that I didn't need to spend."  Writer calls the pharmacy and explains the situation.  Pharmacy staff states that once a prescription, unopened or opened leaves the pharmacy, it cannot be returned for reimbursement.  Writer calls pt and explains this to her and apologizes for the inconvenience.  Pt states, "I am just not happy about this situation.  I think I should be reimbursed."  Writer states that she would need to speak with her supervisor about that and pt states she would like Aurea Graff to know the situation.  She states she knows Aurea Graff personally.  Writer again apologizes for the inconvenience and sends Bisacodyl rx to her pharmacy.  Pt has several questions about her medications and all questions answered.

## 2011-11-04 ENCOUNTER — Telehealth: Payer: Self-pay | Admitting: *Deleted

## 2011-11-04 ENCOUNTER — Telehealth: Payer: Self-pay | Admitting: Internal Medicine

## 2011-11-04 NOTE — Telephone Encounter (Signed)
Dulcolax 5mg  tablets, 4 tablets, no refills.  Follow instructions per prep instructions  Called into [facility] in [place] as per pt request.  Pt made aware

## 2011-11-04 NOTE — Telephone Encounter (Signed)
Pt returning call and states that she will be getting her Dulcolax tabs from her pharmacy- she called and they were there.  Pt also states that she doesn't need to be reimbursed for her prep- she states, "Mistakes happen and I totally understand.  Please don't make a big deal about this.  If I bring in my prep in, can it be used for someone who doesn't have insurance, please do so."  Writer told her that she wasn't sure what the protocol was but pt states she would bring it  in anyways.

## 2011-11-04 NOTE — Telephone Encounter (Signed)
Message copied by Inocente Salles on Tue Nov 04, 2011 10:41 AM ------      Message from: Karna Christmas D      Created: Tue Nov 04, 2011 10:31 AM       Pt said she is returning your call      [phone]

## 2011-11-05 ENCOUNTER — Encounter (HOSPITAL_COMMUNITY)
Admission: RE | Disposition: A | Discharge status: Home / Self Care | Payer: Self-pay | Source: Ambulatory Visit | Attending: Internal Medicine

## 2011-11-05 ENCOUNTER — Ambulatory Visit (HOSPITAL_COMMUNITY)
Admission: RE | Admit: 2011-11-05 | Discharge: 2011-11-05 | Disposition: A | Discharge status: Home / Self Care | Payer: 59 | Source: Ambulatory Visit | Attending: Internal Medicine | Admitting: Internal Medicine

## 2011-11-05 ENCOUNTER — Encounter (HOSPITAL_COMMUNITY): Payer: Self-pay | Admitting: *Deleted

## 2011-11-05 DIAGNOSIS — Z86711 Personal history of pulmonary embolism: Secondary | ICD-10-CM | POA: Insufficient documentation

## 2011-11-05 DIAGNOSIS — E119 Type 2 diabetes mellitus without complications: Secondary | ICD-10-CM | POA: Insufficient documentation

## 2011-11-05 DIAGNOSIS — Z09 Encounter for follow-up examination after completed treatment for conditions other than malignant neoplasm: Secondary | ICD-10-CM | POA: Insufficient documentation

## 2011-11-05 DIAGNOSIS — I10 Essential (primary) hypertension: Secondary | ICD-10-CM | POA: Insufficient documentation

## 2011-11-05 DIAGNOSIS — K620 Anal polyp: Secondary | ICD-10-CM | POA: Insufficient documentation

## 2011-11-05 DIAGNOSIS — E039 Hypothyroidism, unspecified: Secondary | ICD-10-CM | POA: Insufficient documentation

## 2011-11-05 DIAGNOSIS — E785 Hyperlipidemia, unspecified: Secondary | ICD-10-CM | POA: Insufficient documentation

## 2011-11-05 DIAGNOSIS — G35 Multiple sclerosis: Secondary | ICD-10-CM | POA: Insufficient documentation

## 2011-11-05 DIAGNOSIS — K219 Gastro-esophageal reflux disease without esophagitis: Secondary | ICD-10-CM | POA: Insufficient documentation

## 2011-11-05 HISTORY — DX: Personal history of colonic polyps: Z86.010

## 2011-11-05 HISTORY — DX: Other complications of anesthesia, initial encounter: T88.59XA

## 2011-11-05 HISTORY — PX: COLONOSCOPY: SHX5424

## 2011-11-05 HISTORY — DX: Adverse effect of unspecified anesthetic, initial encounter: T41.45XA

## 2011-11-05 HISTORY — DX: Nausea with vomiting, unspecified: R11.2

## 2011-11-05 HISTORY — DX: Other specified postprocedural states: Z98.890

## 2011-11-05 LAB — GLUCOSE, CAPILLARY: Glucose-Capillary: 134 mg/dL — ABNORMAL HIGH (ref 70–99)

## 2011-11-05 SURGERY — COLONOSCOPY
Anesthesia: Moderate Sedation

## 2011-11-05 MED ORDER — FENTANYL NICU IV SYRINGE 50 MCG/ML
INJECTION | INTRAMUSCULAR | Status: DC | PRN
  Administered 2011-11-05 (×3): 25 ug via INTRAVENOUS

## 2011-11-05 MED ORDER — DIPHENHYDRAMINE HCL 50 MG/ML IJ SOLN
INTRAMUSCULAR | Status: DC | PRN
  Administered 2011-11-05: 12.5 mg via INTRAVENOUS

## 2011-11-05 MED ORDER — FENTANYL CITRATE 0.05 MG/ML IJ SOLN
INTRAMUSCULAR | Status: AC
  Filled 2011-11-05: qty 4

## 2011-11-05 MED ORDER — MIDAZOLAM HCL 10 MG/2ML IJ SOLN
INTRAMUSCULAR | Status: AC
  Filled 2011-11-05: qty 4

## 2011-11-05 MED ORDER — SODIUM CHLORIDE 0.9 % IV SOLN
Freq: Once | INTRAVENOUS | Status: AC
  Administered 2011-11-05: 500 mL via INTRAVENOUS

## 2011-11-05 MED ORDER — DIPHENHYDRAMINE HCL 50 MG/ML IJ SOLN
INTRAMUSCULAR | Status: AC
  Filled 2011-11-05: qty 1

## 2011-11-05 MED ORDER — MIDAZOLAM HCL 5 MG/5ML IJ SOLN
INTRAMUSCULAR | Status: DC | PRN
  Administered 2011-11-05 (×3): 2.5 mg via INTRAVENOUS

## 2011-11-05 NOTE — Discharge Instructions (Signed)
Two tiny rectal polyps were removed. Otherwise ok. I will let you know when you need another routine colonoscopy but probably 5 years for next one would be ok. [name], MD, [facility] Colonoscopy Care After These instructions give you information on caring for yourself after your procedure. Your doctor may also give you more specific instructions. Call your doctor if you have any problems or questions after your procedure. HOME CARE  Take it easy for the next 24 hours.   Rest.   Walk or use warm packs on your belly (abdomen) if you have belly cramping or gas.   Do not drive for 24 hours.   You may shower.   Do not sign important papers or use machinery for 24 hours.   Drink enough fluids to keep your pee (urine) clear or pale yellow.   Resume your normal diet. Avoid heavy or fried foods.   Avoid alcohol.   Continue taking your normal medicines.   Only take medicine as told by your doctor. Do not take aspirin.  If you had growths (polyps) removed:  Do not take aspirin.   Do not drink alcohol for 7 days or as told by your doctor.   Eat a soft diet for 24 hours.  GET HELP RIGHT AWAY IF:  You have a fever.   You pass clumps of tissue (blood clots) or fill the toilet with blood.   You have belly pain that gets worse and medicine does not help.   Your belly is puffy (swollen).   You feel sick to your stomach (nauseous) or throw up (vomit).  MAKE SURE YOU:  Understand these instructions.   Will watch your condition.   Will get help right away if you are not doing well or get worse.  Document Released: 07/12/2010 Document Revised: 05/29/2011 Document Reviewed: 07/12/2010 [facility] Patient Information 2012 [place], [place].

## 2011-11-05 NOTE — H&P (Signed)
  HPI  The patient is here for surveillance and screening due to history of tubulovillous adenoma of colon. No GI complaints other than chronic diarrhea.   No Known Allergies @ENCMEDSTART @ Past Medical History  Diagnosis Date  . PULMONARY EMBOLISM 11/2006    post op  . DEEP VENOUS THROMBOPHLEBITIS 11/2006    post surg, anticoag x 6 mo  . ANXIETY DISORDER   . DIABETES MELLITUS, TYPE II   . GERD   . HYPERLIPIDEMIA   . HYPERTENSION   . NEUROPATHY   . HYPOTHYROIDISM   . MENOPAUSAL SYNDROME   . Multiple sclerosis   . SINUS TARSI SYNDROME   . IBS   . TRANSAMINASES, SERUM, ELEVATED   . Complication of anesthesia     difficulty waking  . PONV (postoperative nausea and vomiting)   . TUBULOVILLOUS ADENOMA, COLON, HX OF 09/21/2008    Annotation: cecum, with high-grade dysplasia original colonoscopy [place] resected by Dr. Michaell Cowing post-op leak and complicated course Qualifier: Diagnosis of  By: Leone Payor MD, Charlyne Quale    Past Surgical History  Procedure Date  . Laraoscopic cecal polyp resection 11/2006    Right  . Ex lap and ileostomy/hartmanns pouch for anatamotic leak 11/2006  . Ileostomy reversal 04/2007  . Cholecystectomy   . Cesarean section     x's 2  . Abdominoplasty   . Total thyroidectomy 1990  . Colonoscopy     multiple   History   Social History  . Marital Status: Married    Spouse Name: N/A    Number of Children: N/A  . Years of Education: N/A   Social History Main Topics  . Smoking status: Never Smoker   . Smokeless tobacco: Never Used  . Alcohol Use: No  . Drug Use: No  . Sexually Active: None   Other Topics Concern  . None   Social History Narrative  . None   Family History  Problem Relation Age of Onset  . Breast cancer Mother   . Diabetes Maternal Grandmother   . Diabetes Paternal Grandmother   . Stomach cancer Father   . Colon cancer Neg Hx   . Esophageal cancer Neg Hx   . Rectal cancer Neg Hx       See pre-sedation for  PE  Ass/Plan:  History of colon polyps, for screening and surveillance colonoscopy.

## 2011-11-05 NOTE — Op Note (Signed)
[facility] [address] [place], [place]  [address]  COLONOSCOPY PROCEDURE REPORT  PATIENT:  [name], [name]  MR#:  [id number] BIRTHDATE:  [birth year], 55 yrs. old  GENDER:  female ENDOSCOPIST:  [name], MD, [facility]  PROCEDURE DATE:  11/05/2011 PROCEDURE:  Colonoscopy with biopsy ASA CLASS:  Class II INDICATIONS:  surveillance and high-risk screening, history of pre-cancerous (adenomatous) colon polyps large tubulovillous adenoma resected 11/2006, no polyps since, last colonoscopy 2010 MEDICATIONS:   Benadryl 12.5 mg IV, Fentanyl 75 mcg IV, Versed 7.5 mg IV  DESCRIPTION OF PROCEDURE:   After the risks benefits and alternatives of the procedure were thoroughly explained, informed consent was obtained.  Digital rectal exam was performed and revealed no abnormalities.   The Pentax Colonoscope [id number] endoscope was introduced through the anus and advanced to the anastomosis, without limitations.  The quality of the prep was good, using MiraLax.  The instrument was then slowly withdrawn as the colon was fully examined. <<PROCEDUREIMAGES>>  FINDINGS:  Two polyps were found in the rectum. Two diminutive rectal polyps. The polyps were removed using cold biopsy forceps. This was otherwise a normal examination of the colon. S/p right hemi-colectomy.   Retroflexed views in the rectum revealed no other findings other than those already described.    The time to cecum = 3:00 minutes. The scope was then withdrawn in 18:00 minutes from the cecum and the procedure completed. COMPLICATIONS:  None ENDOSCOPIC IMPRESSION: 1) Two diminutive polyps in the rectum - removed 2) Otherwise normal examination 3) Prior large cecal tubulovillous adenoma resected 11/2006  REPEAT EXAM:  In for Colonoscopy, pending biopsy results.  [name], MD, [name]  CC:  The Patient  n. eSIGNED:   [name] at 11/05/2011 10:43 AM  Hillary Bow, [phone]

## 2011-11-07 ENCOUNTER — Encounter: Payer: Self-pay | Admitting: Internal Medicine

## 2011-11-07 ENCOUNTER — Encounter (HOSPITAL_COMMUNITY): Payer: Self-pay | Admitting: Internal Medicine

## 2011-11-07 NOTE — Progress Notes (Signed)
Quick Note:  Hyperplastic polyps Repeat colonoscopy 5 years about 10/2016 ______

## 2011-12-05 ENCOUNTER — Other Ambulatory Visit: Payer: Self-pay | Admitting: Psychiatry

## 2011-12-05 DIAGNOSIS — G35 Multiple sclerosis: Secondary | ICD-10-CM

## 2011-12-08 ENCOUNTER — Other Ambulatory Visit (HOSPITAL_COMMUNITY): Payer: Self-pay | Admitting: Psychiatry

## 2011-12-08 DIAGNOSIS — G35 Multiple sclerosis: Secondary | ICD-10-CM

## 2011-12-12 ENCOUNTER — Other Ambulatory Visit: Payer: Self-pay | Admitting: Internal Medicine

## 2011-12-12 ENCOUNTER — Other Ambulatory Visit (HOSPITAL_COMMUNITY): Payer: 59

## 2011-12-12 NOTE — Telephone Encounter (Signed)
540 for 3 month supply refill is ok

## 2011-12-12 NOTE — Telephone Encounter (Signed)
Done hardcopy to robin  

## 2011-12-12 NOTE — Telephone Encounter (Signed)
Faxed hardcopy to pharmacy. 

## 2011-12-12 NOTE — Telephone Encounter (Signed)
Hello, wanted to check to make sure ok to refill her Lomotil, wants large quantity. Thank you for your help.

## 2011-12-15 ENCOUNTER — Other Ambulatory Visit: Payer: Self-pay | Admitting: Internal Medicine

## 2011-12-15 ENCOUNTER — Ambulatory Visit (HOSPITAL_COMMUNITY)
Admission: RE | Admit: 2011-12-15 | Discharge: 2011-12-15 | Disposition: A | Discharge status: Home / Self Care | Payer: 59 | Source: Ambulatory Visit | Attending: Psychiatry | Admitting: Psychiatry

## 2011-12-15 DIAGNOSIS — G35 Multiple sclerosis: Secondary | ICD-10-CM

## 2012-01-05 ENCOUNTER — Other Ambulatory Visit (HOSPITAL_COMMUNITY): Payer: Self-pay | Admitting: Psychiatry

## 2012-01-05 ENCOUNTER — Ambulatory Visit
Admission: RE | Admit: 2012-01-05 | Discharge: 2012-01-05 | Disposition: A | Discharge status: Home / Self Care | Payer: 59 | Source: Ambulatory Visit | Attending: Psychiatry | Admitting: Psychiatry

## 2012-01-05 ENCOUNTER — Ambulatory Visit: Admission: RE | Admit: 2012-01-05 | Payer: 59 | Source: Ambulatory Visit

## 2012-01-05 DIAGNOSIS — G35 Multiple sclerosis: Secondary | ICD-10-CM

## 2012-01-05 MED ORDER — GADOBENATE DIMEGLUMINE 529 MG/ML IV SOLN
19.0000 mL | Freq: Once | INTRAVENOUS | Status: AC | PRN
  Administered 2012-01-05: 19 mL via INTRAVENOUS

## 2012-01-26 ENCOUNTER — Telehealth: Payer: Self-pay | Admitting: Internal Medicine

## 2012-01-26 NOTE — Telephone Encounter (Signed)
Patient states she is having right abdominal pain under rib cage off and on. Pain is burning and achy. She saw Dr. Harrel Carina for her diabetes and he suggested she see Dr. Leone Payor to evaluate. Scheduled on 01/27/12 at 2:00 PM with Dr. Leone Payor

## 2012-01-27 ENCOUNTER — Encounter: Payer: Self-pay | Admitting: Internal Medicine

## 2012-01-27 ENCOUNTER — Ambulatory Visit (INDEPENDENT_AMBULATORY_CARE_PROVIDER_SITE_OTHER): Payer: 59 | Admitting: Internal Medicine

## 2012-01-27 VITALS — BP 110/72 | HR 72 | Ht 65.0 in | Wt 212.8 lb

## 2012-01-27 DIAGNOSIS — R1011 Right upper quadrant pain: Secondary | ICD-10-CM

## 2012-01-27 DIAGNOSIS — K760 Fatty (change of) liver, not elsewhere classified: Secondary | ICD-10-CM

## 2012-01-27 DIAGNOSIS — R7402 Elevation of levels of lactic acid dehydrogenase (LDH): Secondary | ICD-10-CM

## 2012-01-27 DIAGNOSIS — K7689 Other specified diseases of liver: Secondary | ICD-10-CM

## 2012-01-27 NOTE — Progress Notes (Signed)
  Subjective:    Patient ID: [name], female    DOB: Mar 17, 1956, 56 y.o.   MRN: [id number]  HPI The patient presents with concerns about a burning right upper quadrant pain that comes and goes. She has had an increase in her LFTs, transaminases have fluctuated over time. She has chronic elevated transaminases and I have thought she has had fatty liver.  She was diagnosed and treated for Lyme disease more recently and has been on chronic antibiotics as listed in the medication list. She has about one month ago on the antibiotics. She describes a loss of appetite. She's been somewhat nauseated.  She was seen her endocrinologist check liver tests and saw that they were elevated and by she follow back up with me.  Medications, allergies, past medical history, past surgical history, family history and social history are reviewed and updated in the EMR.  Review of Systems Multiple sclerosis overall stable at this time.    Objective:   Physical Exam General:  NAD Eyes:   anicteric Abdomen:  soft and nontender, BS+   Data Reviewed:  Recent labs mainly liver tests     Assessment & Plan:   1. Nonspecific elevation of levels of transaminase or lactic acid dehydrogenase (LDH)   2. RUQ pain   3. Fatty liver   Her symptoms are really similar to what she's had in the past. Her transaminases in the recent past been as high as about 2 times abnormal but they have been that high before and then again are chronically elevated. I suspect this is really a fatty liver problem and nothing more though she may have some other GI type side effects related to chronic antibiotic use.  I plan to recheck her LFTs in late August and go from there. She was reassured and seemed excepting today.    I really think this is a recurrent fatty liver problem most likely. Certainly the chronic antibiotics for the Lyme disease or causing appetite suppression upset stomach I would think.

## 2012-01-27 NOTE — Patient Instructions (Addendum)
Your physician has requested that you go to the basement for the following lab work liver function panel the last week of August.  Our lab is open 7:30am-5:30pm , no appointment is needed.  We will be in touch after the results are reviewed.  Thank you for choosing me and [facility].  [name], M.D., [name] :

## 2012-01-30 ENCOUNTER — Encounter: Payer: Self-pay | Admitting: Internal Medicine

## 2012-02-11 ENCOUNTER — Other Ambulatory Visit (INDEPENDENT_AMBULATORY_CARE_PROVIDER_SITE_OTHER): Payer: 59

## 2012-02-11 LAB — HEPATIC FUNCTION PANEL
AST: 92 U/L — ABNORMAL HIGH (ref 0–37)
Albumin: 4.4 g/dL (ref 3.5–5.2)
Alkaline Phosphatase: 98 U/L (ref 39–117)
Total Bilirubin: 0.6 mg/dL (ref 0.3–1.2)

## 2012-02-12 ENCOUNTER — Telehealth: Payer: Self-pay | Admitting: Internal Medicine

## 2012-02-12 NOTE — Telephone Encounter (Signed)
Patient advised I have mailed her a copy and faxed a copy to her neurologist Harriette Bouillon

## 2012-02-12 NOTE — Telephone Encounter (Signed)
Still abnormal - a little worse - I will call her personally by tomorrow  Ok to send her a copy-

## 2012-02-12 NOTE — Telephone Encounter (Signed)
Dr. Leone Payor she is calling for her LFT results. Please review and advise what you want me to tell her

## 2012-02-13 ENCOUNTER — Other Ambulatory Visit: Payer: Self-pay | Admitting: Internal Medicine

## 2012-02-13 NOTE — Telephone Encounter (Signed)
Discussed results of LFT's - possible that LFT increase from Abx (for Lyme DZ) on top of the fatty liver  Needs appt w/ Dr. Hall Busing at [facility] re: fatty liver disease - [employer] insurance out end of Oct so would need appt in 1 month or so - if not possible by mid-Oct let me know  Also let me know and I will create referral letter  Pam should get discs of recent CT abd also  Do LFT's here in 1 month also

## 2012-02-16 NOTE — Telephone Encounter (Signed)
Dr. Leone Payor I need to send the records for review before they will schedule an appt.  Do you want to make the referral letter now so I can send with the records?

## 2012-02-16 NOTE — Addendum Note (Signed)
Addended by: Annett Fabian on: 02/16/2012 10:30 AM   Modules accepted: Orders

## 2012-02-16 NOTE — Telephone Encounter (Signed)
Patient advised to call and get a copy of the CT scan from 09/15/09.  She is advised that [facility] will be contacting her for an office visit

## 2012-02-27 ENCOUNTER — Other Ambulatory Visit (INDEPENDENT_AMBULATORY_CARE_PROVIDER_SITE_OTHER): Payer: 59

## 2012-02-27 LAB — HEPATIC FUNCTION PANEL
ALT: 134 U/L — ABNORMAL HIGH (ref 0–35)
AST: 86 U/L — ABNORMAL HIGH (ref 0–37)
Alkaline Phosphatase: 92 U/L (ref 39–117)
Total Bilirubin: 0.8 mg/dL (ref 0.3–1.2)

## 2012-03-01 ENCOUNTER — Telehealth: Payer: Self-pay | Admitting: Internal Medicine

## 2012-03-01 NOTE — Telephone Encounter (Signed)
Dr. Leone Payor please advise what I should tell her.

## 2012-03-01 NOTE — Progress Notes (Signed)
Quick Note:  Liver tests are the same  I have not had a chance to create a letter for [facility]  Has she heard from them about possible appointment? ______

## 2012-03-08 ENCOUNTER — Encounter: Payer: Self-pay | Admitting: Family Medicine

## 2012-03-08 ENCOUNTER — Ambulatory Visit (INDEPENDENT_AMBULATORY_CARE_PROVIDER_SITE_OTHER): Payer: 59 | Admitting: Family Medicine

## 2012-03-08 VITALS — BP 100/70 | HR 76 | Temp 98.2°F | Resp 16 | Ht 64.0 in | Wt 208.0 lb

## 2012-03-08 DIAGNOSIS — E785 Hyperlipidemia, unspecified: Secondary | ICD-10-CM

## 2012-03-08 DIAGNOSIS — R609 Edema, unspecified: Secondary | ICD-10-CM

## 2012-03-08 DIAGNOSIS — E782 Mixed hyperlipidemia: Secondary | ICD-10-CM

## 2012-03-08 DIAGNOSIS — Z23 Encounter for immunization: Secondary | ICD-10-CM

## 2012-03-08 DIAGNOSIS — I10 Essential (primary) hypertension: Secondary | ICD-10-CM

## 2012-03-08 DIAGNOSIS — M79609 Pain in unspecified limb: Secondary | ICD-10-CM

## 2012-03-08 DIAGNOSIS — M79642 Pain in left hand: Secondary | ICD-10-CM

## 2012-03-08 MED ORDER — FUROSEMIDE 20 MG PO TABS: 20.0000 mg | ORAL_TABLET | Freq: Every day | ORAL | Status: DC

## 2012-03-08 NOTE — Patient Instructions (Addendum)
Try cutting back on the lasix to 10mg , then eventually just using 10-20 mg as needed for swelling.  Return for fasting lipids.  It was a pleasure meeting you!
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
## 2012-03-19 ENCOUNTER — Telehealth: Payer: Self-pay | Admitting: Internal Medicine

## 2012-03-19 ENCOUNTER — Encounter: Payer: Self-pay | Admitting: Gastroenterology

## 2012-03-19 NOTE — Telephone Encounter (Signed)
error 

## 2012-03-19 NOTE — Telephone Encounter (Signed)
Dr. Ronn Melena at [facility] has asked that we draw LFT on the patient next week. I have placed the orders the patient is advised to come based on Dr. Marline Backbone instructions.

## 2012-03-22 ENCOUNTER — Encounter: Payer: Self-pay | Admitting: Family Medicine

## 2012-03-22 ENCOUNTER — Other Ambulatory Visit: Payer: 59

## 2012-03-22 DIAGNOSIS — E782 Mixed hyperlipidemia: Secondary | ICD-10-CM

## 2012-03-22 DIAGNOSIS — K760 Fatty (change of) liver, not elsewhere classified: Secondary | ICD-10-CM

## 2012-03-22 LAB — HEPATIC FUNCTION PANEL
ALT: 82 U/L — ABNORMAL HIGH (ref 0–35)
AST: 47 U/L — ABNORMAL HIGH (ref 0–37)
Albumin: 4.7 g/dL (ref 3.5–5.2)
Alkaline Phosphatase: 88 U/L (ref 39–117)
Total Protein: 7.2 g/dL (ref 6.0–8.3)

## 2012-03-22 LAB — LIPID PANEL
Cholesterol: 212 mg/dL — ABNORMAL HIGH (ref 0–200)
HDL: 39 mg/dL — ABNORMAL LOW (ref >39)
Triglycerides: 264 mg/dL — ABNORMAL HIGH (ref <150)

## 2012-03-25 ENCOUNTER — Telehealth: Payer: Self-pay | Admitting: Internal Medicine

## 2012-03-25 NOTE — Telephone Encounter (Signed)
According to the patient she was advised that by [facility] MD [place] ws normal and he was not concerned about elevated LFT's.  She does have follow up with them in December.

## 2012-03-25 NOTE — Telephone Encounter (Signed)
In general a statin is ok in fatty liver I do not have info back from [facility] liver specialist she saw, yet - what did they tell [name] about her abnormal LFT's See if we can get a copy of the note from [facility]  I have cced Dr. Lynelle Doctor

## 2012-03-25 NOTE — Telephone Encounter (Signed)
Dr. Leone Payor please review LFT results and questions put to the patient by Dr. Lynelle Doctor and advise

## 2012-03-29 ENCOUNTER — Telehealth: Payer: Self-pay | Admitting: *Deleted

## 2012-03-29 ENCOUNTER — Other Ambulatory Visit: Payer: Self-pay | Admitting: *Deleted

## 2012-03-29 DIAGNOSIS — Z79899 Other long term (current) drug therapy: Secondary | ICD-10-CM

## 2012-03-29 DIAGNOSIS — E785 Hyperlipidemia, unspecified: Secondary | ICD-10-CM

## 2012-03-29 MED ORDER — PRAVASTATIN SODIUM 20 MG PO TABS: 20.0000 mg | ORAL_TABLET | Freq: Every day | ORAL | Status: DC

## 2012-03-29 NOTE — Telephone Encounter (Signed)
Patient spoke with Dr.Gessner and he said that it would be okay for patient to start a statin. I called in pravastatin 20mg  #30 with 1 refill per your notes. Patient will be coming in Nov 4th @ 10:45 for a 4 week hepatic panel(I placed future orders) and she will call back for the 8 week hepatic and fasting lipid panel lab visit as she cannot schedule at this time due to her schedule. I did put in future orders for that lab visit as well. Just an FYI. Thanks.

## 2012-04-20 ENCOUNTER — Other Ambulatory Visit: Payer: 59

## 2012-04-20 DIAGNOSIS — Z79899 Other long term (current) drug therapy: Secondary | ICD-10-CM

## 2012-04-21 ENCOUNTER — Telehealth: Payer: Self-pay | Admitting: Family Medicine

## 2012-04-21 DIAGNOSIS — E785 Hyperlipidemia, unspecified: Secondary | ICD-10-CM

## 2012-04-21 LAB — HEPATIC FUNCTION PANEL
Albumin: 4.1 g/dL (ref 3.5–5.2)
Alkaline Phosphatase: 78 U/L (ref 39–117)
Total Protein: 6.5 g/dL (ref 6.0–8.3)

## 2012-04-21 MED ORDER — PRAVASTATIN SODIUM 20 MG PO TABS
20.0000 mg | ORAL_TABLET | Freq: Every day | ORAL | Status: DC
Start: 2012-04-21 — End: 2012-10-28

## 2012-04-21 MED ORDER — PRAVASTATIN SODIUM 20 MG PO TABS: 20.0000 mg | ORAL_TABLET | Freq: Every day | ORAL | Status: DC

## 2012-04-21 NOTE — Telephone Encounter (Signed)
Left message on vm stating med was called in and she needed to call back to schedule a nurse visit to have labs done to determine in the pravastatin was appropriate or not.

## 2012-04-21 NOTE — Telephone Encounter (Signed)
Advise pt done.  Sent for #90. Labs due (orders in system) in 4 weeks to check lipids, LFT's (and determine if pravastatin dose is appropriate or needs to be changed). Please schedule lab visit for her.

## 2012-04-21 NOTE — Telephone Encounter (Signed)
Pt called and stated she needs refill on pravastatin. Pt needs it for 90 days. Pt states she needs it refilled today if at all possible because she has a insurance change this week. Pt uses [facility].

## 2012-04-26 ENCOUNTER — Other Ambulatory Visit: Payer: 59

## 2012-05-11 ENCOUNTER — Telehealth: Payer: Self-pay | Admitting: Internal Medicine

## 2012-05-11 MED ORDER — OLMESARTAN MEDOXOMIL-HCTZ 20-12.5 MG PO TABS: 1.0000 | ORAL_TABLET | Freq: Every day | ORAL | Status: DC

## 2012-05-11 NOTE — Telephone Encounter (Signed)
Patients RX refill was sent to the pharmacy. CLS

## 2012-05-21 ENCOUNTER — Telehealth: Payer: Self-pay | Admitting: Family Medicine

## 2012-05-21 NOTE — Telephone Encounter (Signed)
This is not on her med list. No gout in her history that was reviewed at her visit (looks like on problem list from 2010).  No uric acid checked.  Needs OV (this isn't started during acute flare, is for prevention, and gout was never discussed by [place]).

## 2012-05-21 NOTE — Telephone Encounter (Signed)
Wants Rx on Allopurinol  For gout   Please call

## 2012-05-24 ENCOUNTER — Other Ambulatory Visit: Payer: Self-pay | Admitting: *Deleted

## 2012-05-24 NOTE — Telephone Encounter (Signed)
Patient scheduled appt for Wed @ 8:30am.

## 2012-05-26 ENCOUNTER — Encounter: Payer: Self-pay | Admitting: Family Medicine

## 2012-05-26 ENCOUNTER — Ambulatory Visit (INDEPENDENT_AMBULATORY_CARE_PROVIDER_SITE_OTHER): Payer: PRIVATE HEALTH INSURANCE | Admitting: Family Medicine

## 2012-05-26 VITALS — BP 158/90 | HR 80 | Ht 64.0 in | Wt 207.0 lb

## 2012-05-26 DIAGNOSIS — M109 Gout, unspecified: Secondary | ICD-10-CM | POA: Insufficient documentation

## 2012-05-26 DIAGNOSIS — E785 Hyperlipidemia, unspecified: Secondary | ICD-10-CM

## 2012-05-26 DIAGNOSIS — Z79899 Other long term (current) drug therapy: Secondary | ICD-10-CM

## 2012-05-26 LAB — LIPID PANEL
HDL: 33 mg/dL — ABNORMAL LOW (ref >39)
LDL Cholesterol: 52 mg/dL (ref 0–99)
Total CHOL/HDL Ratio: 4.1 Ratio
Triglycerides: 256 mg/dL — ABNORMAL HIGH (ref <150)
VLDL: 51 mg/dL — ABNORMAL HIGH (ref 0–40)

## 2012-05-26 LAB — HEPATIC FUNCTION PANEL
Alkaline Phosphatase: 106 U/L (ref 39–117)
Bilirubin, Direct: 0.1 mg/dL (ref 0.0–0.3)
Indirect Bilirubin: 0.2 mg/dL (ref 0.0–0.9)

## 2012-05-26 MED ORDER — COLCHICINE 0.6 MG PO TABS: ORAL_TABLET | ORAL | Status: DC

## 2012-05-26 NOTE — Progress Notes (Signed)
Chief Complaint  Patient presents with  . Gout    would like medication for gout-needs OV. Also needs fasting labs.   HPI: Gout--The last time she had a problem with gout was back in 2010-2011, and at that time was treated with allopurinol.  She reports that she didn't take that preventatively, just for a short while.  L great toe started getting painful, swollen, hot, red in early November while traveling in [place].  While traveling her diet was different, had beer.  Pain has been fluctuating since then, currently doing better--swelling is down, still looks red but no longer painful.  Has been using OTC aspirin.  She also had used some leftover allopurinol prior to calling here for refill.  HTN--she has been off her Benicar, while prior Berkley Harvey is pending.  Picking up samples today while this is getting settled. BP's usually 110-120 on the medication.  Denies headache, chest pain, edema.  Past Medical History  Diagnosis Date  . PULMONARY EMBOLISM 11/2006    post op  . DEEP VENOUS THROMBOPHLEBITIS 11/2006    post surg, anticoag x 6 mo  . DIABETES MELLITUS, TYPE II     Dr. Sharl Ma  . GERD   . HYPERLIPIDEMIA   . HYPERTENSION   . NEUROPATHY     related to MS  . HYPOTHYROIDISM     Dr. Sharl Ma  . MENOPAUSAL SYNDROME   . Multiple sclerosis     Dr. Leotis Shames (in [place])  . SINUS TARSI SYNDROME   . IBS   . TRANSAMINASES, SERUM, ELEVATED     Dr. Leone Payor, referred to [facility]  . Complication of anesthesia     difficulty waking  . PONV (postoperative nausea and vomiting)   . TUBULOVILLOUS ADENOMA, COLON, HX OF 09/21/2008    Annotation: cecum, with high-grade dysplasia original colonoscopy Ganem resected by Dr. Michaell Cowing post-op leak and complicated course Qualifier: Diagnosis of  By: Leone Payor MD, Charlyne Quale   . Colon polyps     hyperplastic; Dr. Leone Payor  . Gout    Past Surgical History  Procedure Date  . Laraoscopic cecal polyp resection 11/2006    Right  . Ex lap and ileostomy/hartmanns pouch for  anatamotic leak 11/2006  . Ileostomy reversal 04/2007  . Cholecystectomy   . Cesarean section     x's 2  . Abdominoplasty   . Thyroidectomy, partial 1990    right  . Colonoscopy     multiple  . Colonoscopy 11/05/2011    Procedure: COLONOSCOPY;  Surgeon: [name], MD;  Location: [facility];  Service: Endoscopy;  Laterality: N/A;   History   Social History  . Marital Status: Married    Spouse Name: N/A    Number of Children: 3  . Years of Education: N/A   Occupational History  . 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 Smoking status: Never Smoker   . Smokeless tobacco: Never Used  . Alcohol Use: No  . Drug Use: No  . Sexually Active: Not on file   Other Topics Concern  . Not on file   Social History Narrative   Lives with husband, 1 dog.  Daughter Carollee Herter) lives in [place].  Son in [place] and son in [place].   Current Outpatient Prescriptions on File Prior to Visit  Medication Sig Dispense Refill  . aspirin 81 MG tablet Take 81 mg by mouth daily.        Marland Kitchen  Calcium Carbonate-Vitamin D (CVS CALCIUM 600 + D PO) Take by mouth daily.        . furosemide (LASIX) 20 MG tablet Take 1 tablet (20 mg total) by mouth daily.  90 tablet  1  . JANUVIA 100 MG tablet TAKE 1 TABLET BY MOUTH DAILY  30 tablet  6  . levothyroxine (SYNTHROID, LEVOTHROID) 137 MCG tablet Take 137 mcg by mouth daily.      . metFORMIN (GLUCOPHAGE) 500 MG tablet Take 500 mg by mouth 2 (two) times daily with a meal.      . Multiple Vitamin (MULTIVITAMIN) tablet Take 1 tablet by mouth daily.        Marland Kitchen olmesartan-hydrochlorothiazide (BENICAR HCT) 20-12.5 MG per tablet Take 1 tablet by mouth daily.  90 tablet  1  . omeprazole (PRILOSEC) 20 MG capsule TAKE ONE CAPSULE BY MOUTH ONCE DAILY  90 capsule  1  . pravastatin (PRAVACHOL) 20 MG tablet Take 1 tablet (20 mg total) by mouth daily.  90 tablet  0  . propranolol ER (INDERAL LA) 120 MG 24 hr capsule Take 1  capsule (120 mg total) by mouth daily.  90 capsule  2  . TRUEPLUS LANCETS 28G MISC by Does not apply route as needed.        . TRUETEST TEST test strip USE TWICE DAILY TO CHECK BLOOD SUGARS  200 each  1  . acyclovir (ZOVIRAX) 400 MG tablet Take 400 mg by mouth 3 (three) times daily as needed.        . colchicine (COLCRYS) 0.6 MG tablet Take 2 tablets at onset of gout attack.  Take additional 1 tablet in 1 hour, if needed  30 tablet  1  . diphenoxylate-atropine (LOMOTIL) 2.5-0.025 MG per tablet TAKE 1 TO 2 TABLETS BY MOUTH EVERY 6 HOURS AS NEEDED FOR DIARRHEA  540 tablet  0  . fish oil-omega-3 fatty acids 1000 MG capsule Take 2 g by mouth daily.      . modafinil (PROVIGIL) 200 MG tablet Take 1 tablet (200 mg total) by mouth 2 (two) times daily as needed.  60 tablet  3   No Known Allergies  ROS:  Denies fevers, URI symptoms, abdominal pain, bowel changes, bleeding/bruising, or other concerns.  No headaches, chest pain, shortness of breath  PHYSICAL EXAM: BP 158/90  Pulse 80  Ht 5\' 4"  (1.626 m)  Wt 207 lb (93.895 kg)  BMI 35.53 kg/m2 Well developed, pleasant female in no distress Extremities: 2+ pulse, no edema. L great toe--slightly pink/warm.  No effusion, FROM without pain  ASSESSMENT/PLAN:  1. Acute gouty arthritis  colchicine (COLCRYS) 0.6 MG tablet  2. Encounter for long-term (current) use of other medications  Hepatic Function Panel  3. Other and unspecified hyperlipidemia  Lipid Panel   HTN--elevated today, due to being off meds.  Samples provided today. Gout--likely related to dietary changes with recent travel, however is also on HCTZ which can elevate uric acid.  Given infrequency in flares, will avoid starting allopurinol for now.  If has increase in frequency of flares, can either start allopurinol (assuming uric acid is high) OR try cutting out HCTZ from her BP meds and monitor uric acid level. Restart Benicar HCT and continue to monitor BP.  Recommended checking b-met and  uric acid today along with her lipids/LFT's that are due--pt declines adding these labs today due to not having met her deductible.  Therefore, will rx colchicine rather than rx NSAID (last Cr was >1 yr ago), and  hold off on the uric acid--to be checked later in the year if having ongoing issues with gout. May also use OTC ibuprofen/Aleve as needed. Reviewed risks/side effects of colchicine, and how she should take.  Low purine diet.

## 2012-05-26 NOTE — Patient Instructions (Addendum)
Please look at gout info online regarding dietary information (low purine diet). (uloric or colcrys websites likely will have links) Use colcrys 2 tablets at onset of gout flare, repeat 1 tablet in an hour, and may continue it 1 tablet twice daily until flare completely better. You can use this along with ibuprofen, if needed.  If you have frequent flares, then we need to consider changing your Benicar HCT (to get rid of HCT) vs checking uric acid levels and starting you back on allopurinol

## 2012-06-02 ENCOUNTER — Telehealth: Payer: Self-pay | Admitting: Internal Medicine

## 2012-06-02 MED ORDER — OMEPRAZOLE 20 MG PO CPDR: 20.0000 mg | DELAYED_RELEASE_CAPSULE | Freq: Every day | ORAL | Status: DC

## 2012-06-02 NOTE — Telephone Encounter (Signed)
I sent the patients RX refills to the pharmacy. CLS

## 2012-06-03 ENCOUNTER — Telehealth: Payer: Self-pay | Admitting: Family Medicine

## 2012-06-03 NOTE — Telephone Encounter (Signed)
LM

## 2012-06-07 ENCOUNTER — Telehealth: Payer: Self-pay

## 2012-06-07 NOTE — Telephone Encounter (Signed)
Patient advised of Dr. Gessner's recommendations 

## 2012-06-07 NOTE — Telephone Encounter (Signed)
Message copied by Annett Fabian on Mon Jun 07, 2012  1:43 PM ------      Message from: [name]      Created: Sat Jun 05, 2012  7:56 AM      Regarding: lft review       Let her know I saw LFT's and they are better      I do not think she should worry about her liver test abnormalities - liver appears to be ok as [facility] has told her            Appropriate to treat cholesterol

## 2012-06-28 ENCOUNTER — Other Ambulatory Visit: Payer: Self-pay | Admitting: Internal Medicine

## 2012-07-08 ENCOUNTER — Encounter: Payer: Self-pay | Admitting: Family Medicine

## 2012-07-08 ENCOUNTER — Ambulatory Visit (INDEPENDENT_AMBULATORY_CARE_PROVIDER_SITE_OTHER): Payer: BC Managed Care – PPO | Admitting: Family Medicine

## 2012-07-08 VITALS — BP 138/78 | HR 64 | Ht 64.0 in | Wt 204.0 lb

## 2012-07-08 DIAGNOSIS — R229 Localized swelling, mass and lump, unspecified: Secondary | ICD-10-CM

## 2012-07-08 DIAGNOSIS — L259 Unspecified contact dermatitis, unspecified cause: Secondary | ICD-10-CM

## 2012-07-08 DIAGNOSIS — L309 Dermatitis, unspecified: Secondary | ICD-10-CM

## 2012-07-08 DIAGNOSIS — M79609 Pain in unspecified limb: Secondary | ICD-10-CM

## 2012-07-08 DIAGNOSIS — M79642 Pain in left hand: Secondary | ICD-10-CM | POA: Insufficient documentation

## 2012-07-08 MED ORDER — TRIAMCINOLONE ACETONIDE 0.1 % EX CREA: TOPICAL_CREAM | Freq: Two times a day (BID) | CUTANEOUS | Status: DC

## 2012-07-08 NOTE — Patient Instructions (Addendum)
We are referring you to Dr. Teressa Senter for evaluation of your hand pain/nodule. We will keep an eye on your nodules on leg and arm.  I suspect they are related to previous injections, and are becoming more apparent since losing weight.  Return if significant increase in size (rapid) and/or pain develops, as we will refer for removal.  Keep skin well moisturized.  Avoid prolonged hot showers.  Use steroid cream twice daily for up to 2 weeks.  If lesion persists, return for biopsy (or see dermatologist)

## 2012-07-08 NOTE — Progress Notes (Signed)
Chief Complaint  Patient presents with  . Advice Only    has some lumps under her skin on her right thigh, right arm, left palm and has a dry patch on the base of her neck/upper back area.   Noticed a lump on her right thigh that has been there for years, at least 10 years.  Doesn't think that has changed, but now has noticed another lump immediately adjacent to it about 6 months ago.  Not tender, occasionally itches in that area (and that is how she noticed the change, when rubbing the area).  She has also noticed a lump in her right upper arm around the same time, 6 months ago.  No known change in size over the last 6 months.  Not tender, never itchy.  She previously has given herself injections in her thighs and upper arms in past.  She has lost over 20 pounds in the last 6-8 months, with decrease in appetite in the last year. Still eats, but with decreased appetite doesn't eat as much, more aware of what she is eating, eating healthier choices.  Lump in L palm has increased in size and has become more painful.  In September she reported "bump on the palmar surface of her left hand by her ring finger. She stopped wearing rings because of it. She first noticed it about 1-1.5 years ago, but has gotten larger in size. Bothers her when she drives, it hits the steering wheel. Never triggers, only painful to press on directly, otherwise no discomfort. No numbness, tingling or weakness."--able to wear rings again, but still painful  Also complaining of patch of scaly/flaky skin in upper back.  Never itches, sometimes flakes off, scratched it and it bled recently. Has been there for over 10 years, is worse in the winter. Recalls being told that it was eczema in the past.  Past Medical History  Diagnosis Date  . PULMONARY EMBOLISM 11/2006    post op  . DEEP VENOUS THROMBOPHLEBITIS 11/2006    post surg, anticoag x 6 mo  . DIABETES MELLITUS, TYPE II     Dr. Sharl Ma  . GERD   . HYPERLIPIDEMIA   .  HYPERTENSION   . NEUROPATHY     related to MS  . HYPOTHYROIDISM     Dr. Sharl Ma  . MENOPAUSAL SYNDROME   . Multiple sclerosis     Dr. Leotis Shames (in [place])  . SINUS TARSI SYNDROME   . IBS   . TRANSAMINASES, SERUM, ELEVATED     Dr. Leone Payor, referred to [facility]  . Complication of anesthesia     difficulty waking  . PONV (postoperative nausea and vomiting)   . TUBULOVILLOUS ADENOMA, COLON, HX OF 09/21/2008    Annotation: cecum, with high-grade dysplasia original colonoscopy Ganem resected by Dr. Michaell Cowing post-op leak and complicated course Qualifier: Diagnosis of  By: Leone Payor MD, Charlyne Quale   . Colon polyps     hyperplastic; Dr. Leone Payor  . Gout    Past Surgical History  Procedure Date  . Laraoscopic cecal polyp resection 11/2006    Right  . Ex lap and ileostomy/hartmanns pouch for anatamotic leak 11/2006  . Ileostomy reversal 04/2007  . Cholecystectomy   . Cesarean section     x's 2  . Abdominoplasty   . Thyroidectomy, partial 1990    right  . Colonoscopy     multiple  . Colonoscopy 11/05/2011    Procedure: COLONOSCOPY;  Surgeon: [name], MD;  Location: [facility];  Service: Endoscopy;  Laterality: N/A;   History   Social History  . Marital Status: Married    Spouse Name: N/A    Number of Children: 3  . Years of Education: N/A   Occupational History  . 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 Smoking status: Never Smoker   . Smokeless tobacco: Never Used  . Alcohol Use: No  . Drug Use: No  . Sexually Active: Not on file   Other Topics Concern  . Not on file   Social History Narrative   Lives with husband, 1 dog.  Daughter Carollee Herter) lives in [place].  Son in [place] and son in [place].   Current Outpatient Prescriptions on File Prior to Visit  Medication Sig Dispense Refill  . aspirin 81 MG tablet Take 81 mg by mouth daily.        . Calcium Carbonate-Vitamin D ([employer]S CALCIUM 600 + D PO) Take by mouth daily.         . fish oil-omega-3 fatty acids 1000 MG capsule Take 2 g by mouth daily.      . furosemide (LASIX) 20 MG tablet Take 1 tablet (20 mg total) by mouth daily.  90 tablet  1  . JANUVIA 100 MG tablet TAKE 1 TABLET BY MOUTH DAILY  30 tablet  6  . levothyroxine (SYNTHROID, LEVOTHROID) 137 MCG tablet Take 137 mcg by mouth daily.      . metFORMIN (GLUCOPHAGE) 500 MG tablet Take 500 mg by mouth 2 (two) times daily with a meal.      . modafinil (PROVIGIL) 200 MG tablet Take 1 tablet (200 mg total) by mouth 2 (two) times daily as needed.  60 tablet  3  . Multiple Vitamin (MULTIVITAMIN) tablet Take 1 tablet by mouth daily.        Marland Kitchen olmesartan-hydrochlorothiazide (BENICAR HCT) 20-12.5 MG per tablet Take 1 tablet by mouth daily.  90 tablet  1  . omeprazole (PRILOSEC) 20 MG capsule Take 1 capsule (20 mg total) by mouth daily.  90 capsule  1  . pravastatin (PRAVACHOL) 20 MG tablet Take 1 tablet (20 mg total) by mouth daily.  90 tablet  0  . propranolol ER (INDERAL LA) 120 MG 24 hr capsule TAKE 1 CAPSULE BY MOUTH EVERY DAY  30 capsule  5  . TRUEPLUS LANCETS 28G MISC by Does not apply route as needed.        . TRUETEST TEST test strip USE TWICE DAILY TO CHECK BLOOD SUGARS  200 each  1  . acyclovir (ZOVIRAX) 400 MG tablet Take 400 mg by mouth 3 (three) times daily as needed.        . colchicine (COLCRYS) 0.6 MG tablet Take 2 tablets at onset of gout attack.  Take additional 1 tablet in 1 hour, if needed  30 tablet  1  . diphenoxylate-atropine (LOMOTIL) 2.5-0.025 MG per tablet TAKE 1 TO 2 TABLETS BY MOUTH EVERY 6 HOURS AS NEEDED FOR DIARRHEA  540 tablet  0   No Known Allergies  ROS:  Denies fevers, URI symptoms, cough, shortness of breath, nausea, vomiting.  Decreased appetite, weight loss.  Denies depression  PHYSICAL EXAM: BP 138/78  Pulse 64  Ht 5\' 4"  (1.626 m)  Wt 204 lb (92.534 kg)  BMI 35.02 kg/m2 Well developed, pleasant female in no distress R anterior thigh, proximally--firm subcutaneous well  defined mass measuring 1.25-1.5 cm.  Just proximal and lateral  to this is another small mass, feel similar, measures <0.5 cm.  There is also another slightly more vague mass at her lateral R hip R mid-lateral upper arm--1 cm subcutaneous nontender mass.  Mobile.  L hand: just below the 4th MCP is a firm nodule, measuring about 5-6 mm in size, mildly tender. No triggering.  Area measuring 1.5 x 0.75 cm, without discrete margins that is flaky (flaking off) with some mild erythema is noted in upper back, centrally.  Skin is dry.  ASSESSMENT/PLAN: 1. Dermatitis  triamcinolone cream (KENALOG) 0.1 %  2. Hand pain, left  Ambulatory referral to Hand Surgery  3. Multiple skin nodules     Subcutaneous nodules are most likely related to previous injection sites.  No evidence of infection or cancer.  Patient reassured.  Doubt lipoma, given small size and firm consistency.  May be more apparent due to recent weight loss  Eczema, less likely AK.  Treat with TAC BID for up to 2 weeks.  If persists, return for biopsy  Painful hand nodule--Refer to Dr. Teressa Senter

## 2012-08-06 ENCOUNTER — Encounter: Payer: Self-pay | Admitting: Family Medicine

## 2012-08-06 ENCOUNTER — Ambulatory Visit (INDEPENDENT_AMBULATORY_CARE_PROVIDER_SITE_OTHER): Payer: BC Managed Care – PPO | Admitting: Family Medicine

## 2012-08-06 VITALS — BP 130/86 | HR 68 | Ht 65.0 in | Wt 208.0 lb

## 2012-08-06 DIAGNOSIS — G35 Multiple sclerosis: Secondary | ICD-10-CM

## 2012-08-06 DIAGNOSIS — I10 Essential (primary) hypertension: Secondary | ICD-10-CM

## 2012-08-06 DIAGNOSIS — E119 Type 2 diabetes mellitus without complications: Secondary | ICD-10-CM

## 2012-08-06 DIAGNOSIS — R0789 Other chest pain: Secondary | ICD-10-CM

## 2012-08-06 LAB — POCT HEMOGLOBIN: Hemoglobin: 14.2 g/dL (ref 12.2–16.2)

## 2012-08-06 NOTE — Patient Instructions (Addendum)
Make sure to eat high potassium foods in diet.  If you start having more muscle cramps or spasms, return for chem panel to check potassium. Otherwise, we can wait until you change insurance in May.  You do need to have yearly metabolic panels (including potassium and kidney functions).  Return if worsening symptoms, new symptoms develop, or any other concerns.

## 2012-08-06 NOTE — Progress Notes (Signed)
Chief Complaint  Patient presents with  . chest tightness/SOB    chest tightness sob, dizziness been going for 4 days happens every 2-3 years with these symptoms, not sure if its related to MS or not wants ekg done   Similar symptoms every 2-3 years since 1980's.  Last about 2 weeks, symptoms are intermittent--chest pain described as tightness, head pressure, hears "creaking/crackly noise" in her head.  Having some discomfort also back between her shoulder blades, some sweats, and just feels "indescribably strange".  Usually comes/goes x 2 weeks and self-resolves.  She had recurrent symptoms x 4 days.  She had cardiac workup in 2011 with Dr. Daleen Squibb, which was normal (EKG, CXR, stress test, echo).  She was told that with future episodes she should come in and get an EKG and be evaluated by PCP.  BP's at home running 110/70-80.  Denies sniffling, sneezing, postnasal drainage, sore throat, cough, allergies.  Had some pressure in her chest on the way here, but not currently.  Currently complaining of slight pressure on her head and some lightheadedness.  She has had some intermittent mild vertigo symptoms (not currently).  Sometimes gets dizziness with MS, usually more equilibrium/balance issues, not true vertigo, and this feels different than dizziness from her MS.  Occasionally feels an extra beat, no other palpitations, tachycardia Takes omeprazole daily, no missed doses.  Denies any reflux (better since weight loss), heartburn or belching.  Rode bike yesterday x 20 minutes without problems (tightness came and went the same as prior to exercise, and recurred at rest).  Echo results reviewed from 2011. No prior EKG (in computer just has printed reading, no image) Has had low potassium in the past.  Not rechecked since 02/2011, but not having muscle cramps or spasms.  She is hesitant to do much bloodwork due to the fact that she has $5000 deductible (which she likely won't meet prior to going on Medicare in  May)  DM--well controlled.  Pt states last A1c was 5. Denies hypoglycemia.  Reports thyroid also adequately replaced (under care of Dr. Sharl Ma).  Past Medical History  Diagnosis Date  . PULMONARY EMBOLISM 11/2006    post op  . DEEP VENOUS THROMBOPHLEBITIS 11/2006    post surg, anticoag x 6 mo  . DIABETES MELLITUS, TYPE II     Dr. Sharl Ma  . GERD   . HYPERLIPIDEMIA   . HYPERTENSION   . NEUROPATHY     related to MS  . HYPOTHYROIDISM     Dr. Sharl Ma  . MENOPAUSAL SYNDROME   . Multiple sclerosis     Dr. Leotis Shames (in [place])  . SINUS TARSI SYNDROME   . IBS   . TRANSAMINASES, SERUM, ELEVATED     Dr. Leone Payor, referred to [facility]  . Complication of anesthesia     difficulty waking  . PONV (postoperative nausea and vomiting)   . TUBULOVILLOUS ADENOMA, COLON, HX OF 09/21/2008    Annotation: cecum, with high-grade dysplasia original colonoscopy Ganem resected by Dr. Michaell Cowing post-op leak and complicated course Qualifier: Diagnosis of  By: Leone Payor MD, Charlyne Quale   . Colon polyps     hyperplastic; Dr. Leone Payor  . Gout    Past Surgical History  Procedure Laterality Date  . Laraoscopic cecal polyp resection  11/2006    Right  . Ex lap and ileostomy/hartmanns pouch for anatamotic leak  11/2006  . Ileostomy reversal  04/2007  . Cholecystectomy    . Cesarean section      x's 2  .  Abdominoplasty    . Thyroidectomy, partial  1990    right  . Colonoscopy      multiple  . Colonoscopy  11/05/2011    Procedure: COLONOSCOPY;  Surgeon: [name], MD;  Location: [facility];  Service: Endoscopy;  Laterality: N/A;   History   Social History  . Marital Status: Married    Spouse Name: N/A    Number of Children: 3  . Years of Education: N/A   Occupational History  . 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 Smoking status: Never Smoker   . Smokeless tobacco: Never Used  . Alcohol Use: No  . Drug Use: No  . Sexually Active: Not on  file   Other Topics Concern  . Not on file   Social History Narrative   Lives with husband, 1 dog.  Daughter Carollee Herter) lives in [place].  Son in [place] and son in [place].   Current outpatient prescriptions:acyclovir (ZOVIRAX) 400 MG tablet, Take 400 mg by mouth 3 (three) times daily as needed.  , Disp: , Rfl: ;  aspirin 81 MG tablet, Take 81 mg by mouth daily. , Disp: , Rfl: ;  Calcium Carbonate-Vitamin D (CVS CALCIUM 600 + D PO), Take by mouth daily.  , Disp: , Rfl: ;  colchicine (COLCRYS) 0.6 MG tablet, Take 2 tablets at onset of gout attack.  Take additional 1 tablet in 1 hour, if needed, Disp: 30 tablet, Rfl: 1 fish oil-omega-3 fatty acids 1000 MG capsule, Take 2 g by mouth daily., Disp: , Rfl: ;  furosemide (LASIX) 20 MG tablet, Take 1 tablet (20 mg total) by mouth daily., Disp: 90 tablet, Rfl: 1;  JANUVIA 100 MG tablet, TAKE 1 TABLET BY MOUTH DAILY, Disp: 30 tablet, Rfl: 6;  levothyroxine (SYNTHROID, LEVOTHROID) 137 MCG tablet, Take 137 mcg by mouth daily., Disp: , Rfl:  metFORMIN (GLUCOPHAGE) 500 MG tablet, Take 500 mg by mouth 2 (two) times daily with a meal., Disp: , Rfl: ;  modafinil (PROVIGIL) 200 MG tablet, Take 1 tablet (200 mg total) by mouth 2 (two) times daily as needed., Disp: 60 tablet, Rfl: 3;  Multiple Vitamin (MULTIVITAMIN) tablet, Take 1 tablet by mouth daily.  , Disp: , Rfl: ;  olmesartan-hydrochlorothiazide (BENICAR HCT) 20-12.5 MG per tablet, Take 1 tablet by mouth daily., Disp: 90 tablet, Rfl: 1 omeprazole (PRILOSEC) 20 MG capsule, Take 1 capsule (20 mg total) by mouth daily., Disp: 90 capsule, Rfl: 1;  pravastatin (PRAVACHOL) 20 MG tablet, Take 1 tablet (20 mg total) by mouth daily., Disp: 90 tablet, Rfl: 0;  propranolol ER (INDERAL LA) 120 MG 24 hr capsule, TAKE 1 CAPSULE BY MOUTH EVERY DAY, Disp: 30 capsule, Rfl: 5 triamcinolone cream (KENALOG) 0.1 %, Apply topically 2 (two) times daily. Use sparingly to affected area, Disp: 30 g, Rfl: 0;  TRUEPLUS LANCETS 28G MISC, by Does not  apply route as needed.  , Disp: , Rfl: ;  TRUETEST TEST test strip, USE TWICE DAILY TO CHECK BLOOD SUGARS, Disp: 200 each, Rfl: 1;  diphenoxylate-atropine (LOMOTIL) 2.5-0.025 MG per tablet, TAKE 1 TO 2 TABLETS BY MOUTH EVERY 6 HOURS AS NEEDED FOR DIARRHEA, Disp: 540 tablet, Rfl: 0  No Known Allergies  ROS:  See HPI (for a lot of positive and negative symptoms).  Also denies nausea, vomiting, diarrhea, skin rash, bleeding/bruising, depression or other concerns  PHYSICAL EXAM: BP 130/86  Pulse 68  Ht 5\' 5"  (1.651  m)  Wt 208 lb (94.348 kg)  BMI 34.61 kg/m2 Well developed, well-appearing female in no distress HEENT: PERRL, conjunctiva clear.  TM's and EAC's normal, nasal mucosa only minimally edematous, no erythema, no purulence.  Sinuses nontender.  OP clear Neck: no lymphadenopathy, thyromegaly or carotid bruit Heart: regular rate and rhythm without murmur Lungs: clear bilaterally.  No wheezes, rales, ronchi, no wheeze or cough with forced expiration, good air movement Chest wall: nontender Abdomen: soft, nontender, no organomegaly or mass Extremities: no edema Skin: no rash Psych: normal mood, affect, hygiene and grooming Neuro: alert and oriented.  Cranial nerves intact.  Normal gait, sensation  EKG: NSR rate 68, mild LVH by voltage.  No acute abnormalities.  ASSESSMENT/PLAN:  Chest pressure - Plan: Hemoglobin, EKG 12-Lead  HYPERTENSION  Multiple sclerosis  DIABETES MELLITUS, TYPE II  Chest pressure--unclear etiology.  Reassured it doesn't appear to be cardiac.  ?if related to MS.  No evidence of pulmonary, GI or cardiac etiology of her symptoms.  Expect self-resolution as in past, but return for re-evaluation if symptoms persist, worsen, or change.  Needs b-met (due to meds she takes, HTN, DM)--none checked in the last year per epic, and she doesn't believe it was checked through Dr. Sharl Ma. Can wait until May for $ purposes--unless symptoms persist, muscle cramps, or other  concerns develop in the interim. Discussed high potassium diet

## 2012-08-24 ENCOUNTER — Telehealth: Payer: Self-pay | Admitting: Family Medicine

## 2012-08-31 ENCOUNTER — Ambulatory Visit (INDEPENDENT_AMBULATORY_CARE_PROVIDER_SITE_OTHER): Payer: BC Managed Care – PPO | Admitting: Physician Assistant

## 2012-08-31 ENCOUNTER — Encounter: Payer: Self-pay | Admitting: Physician Assistant

## 2012-08-31 ENCOUNTER — Telehealth: Payer: Self-pay | Admitting: Internal Medicine

## 2012-08-31 ENCOUNTER — Other Ambulatory Visit (INDEPENDENT_AMBULATORY_CARE_PROVIDER_SITE_OTHER): Payer: BC Managed Care – PPO

## 2012-08-31 VITALS — BP 132/74 | HR 70 | Ht 65.0 in | Wt 207.0 lb

## 2012-08-31 DIAGNOSIS — R110 Nausea: Secondary | ICD-10-CM

## 2012-08-31 DIAGNOSIS — R1011 Right upper quadrant pain: Secondary | ICD-10-CM

## 2012-08-31 DIAGNOSIS — R1013 Epigastric pain: Secondary | ICD-10-CM

## 2012-08-31 DIAGNOSIS — Z8619 Personal history of other infectious and parasitic diseases: Secondary | ICD-10-CM

## 2012-08-31 DIAGNOSIS — K7689 Other specified diseases of liver: Secondary | ICD-10-CM

## 2012-08-31 LAB — CBC WITH DIFFERENTIAL/PLATELET
Basophils Relative: 0.4 % (ref 0.0–3.0)
Eosinophils Absolute: 0.1 10*3/uL (ref 0.0–0.7)
Eosinophils Relative: 1.6 % (ref 0.0–5.0)
HCT: 40.2 % (ref 36.0–46.0)
Lymphs Abs: 2.6 10*3/uL (ref 0.7–4.0)
MCHC: 34.2 g/dL (ref 30.0–36.0)
MCV: 87.6 fl (ref 78.0–100.0)
Monocytes Absolute: 0.5 10*3/uL (ref 0.1–1.0)
RBC: 4.58 Mil/uL (ref 3.87–5.11)
WBC: 7.4 10*3/uL (ref 4.5–10.5)

## 2012-08-31 LAB — COMPREHENSIVE METABOLIC PANEL
Albumin: 4.1 g/dL (ref 3.5–5.2)
Alkaline Phosphatase: 94 U/L (ref 39–117)
BUN: 11 mg/dL (ref 6–23)
Creatinine, Ser: 0.7 mg/dL (ref 0.4–1.2)
Glucose, Bld: 101 mg/dL — ABNORMAL HIGH (ref 70–99)
Total Bilirubin: 0.6 mg/dL (ref 0.3–1.2)

## 2012-08-31 LAB — LIPASE: Lipase: 23 U/L (ref 11.0–59.0)

## 2012-08-31 NOTE — Progress Notes (Signed)
Subjective:    Patient ID: [name], female    DOB: 06/23/1956, 57 y.o.   MRN: [id number]  HPI [name] is a pleasant 57 year old white female known to Dr. Leone Payor who has history of hepatic steatosis and intermittently mildly elevated liver function study tests. She also has a diagnosis of MS, hyperlipidemia, GERD, and the adult onset diabetes mellitus. She status post right hemicolectomy secondary to a large tubovillous adenoma and is also status post cholecystectomy and previous repair of a ventral hernia with mesh. Last colonoscopy was done in May of 2013 she had 2 diminutive polyps removed at that time which were hyperplastic During 2013 she had been dealing with Lyme disease and ultimately wound up taking a six-month course of antibiotics her symptoms have all resolved at this time  Her last set of liver function studies test were done in December of 2013 and were normal with the exception of a minimally elevated ALT at 61. Prior to that LFTs in October 2013 showed an AST of 42 ALT of 65. She comes in today complaining of one week history of epigastric pain which radiated into her right abdomen and then eventually around into her right back. Associated with this she had nausea for 3 or 4 days and says she's feeling a bit better today. She also noted distal sustained change in the color of her stools and says she was having "clay colored" stools which were very cream E. and light according to the patient. She also had one episode of chills but no documented fever. She states her appetite is been decreased over the past year and she lost about 30 pounds unintentionally. She did note notice any darkening of her urine with the above symptoms.    Review of Systems  Constitutional: Positive for appetite change and unexpected weight change.  HENT: Negative.   Eyes: Negative.   Respiratory: Negative.   Cardiovascular: Negative.   Gastrointestinal: Positive for nausea and abdominal pain.   Endocrine: Negative.   Genitourinary: Negative.   Allergic/Immunologic: Negative.   Neurological: Negative.   Hematological: Negative.   Psychiatric/Behavioral: Negative.    Outpatient Prescriptions Prior to Visit  Medication Sig Dispense Refill  . acyclovir (ZOVIRAX) 400 MG tablet Take 400 mg by mouth 3 (three) times daily as needed.        Marland Kitchen aspirin 81 MG tablet Take 81 mg by mouth daily.       . colchicine (COLCRYS) 0.6 MG tablet Take 2 tablets at onset of gout attack.  Take additional 1 tablet in 1 hour, if needed  30 tablet  1  . diphenoxylate-atropine (LOMOTIL) 2.5-0.025 MG per tablet TAKE 1 TO 2 TABLETS BY MOUTH EVERY 6 HOURS AS NEEDED FOR DIARRHEA  540 tablet  0  . fish oil-omega-3 fatty acids 1000 MG capsule Take 2 g by mouth daily.      . furosemide (LASIX) 20 MG tablet Take 1 tablet (20 mg total) by mouth daily.  90 tablet  1  . levothyroxine (SYNTHROID, LEVOTHROID) 137 MCG tablet Take 137 mcg by mouth daily.      . metFORMIN (GLUCOPHAGE) 500 MG tablet Take 500 mg by mouth 2 (two) times daily with a meal.      . modafinil (PROVIGIL) 200 MG tablet Take 1 tablet (200 mg total) by mouth 2 (two) times daily as needed.  60 tablet  3  . Multiple Vitamin (MULTIVITAMIN) tablet Take 1 tablet by mouth daily.        Marland Kitchen olmesartan-hydrochlorothiazide (  BENICAR HCT) 20-12.5 MG per tablet Take 1 tablet by mouth daily.  90 tablet  1  . omeprazole (PRILOSEC) 20 MG capsule Take 1 capsule (20 mg total) by mouth daily.  90 capsule  1  . pravastatin (PRAVACHOL) 20 MG tablet Take 1 tablet (20 mg total) by mouth daily.  90 tablet  0  . propranolol ER (INDERAL LA) 120 MG 24 hr capsule TAKE 1 CAPSULE BY MOUTH EVERY DAY  30 capsule  5  . triamcinolone cream (KENALOG) 0.1 % Apply topically 2 (two) times daily. Use sparingly to affected area  30 g  0  . TRUEPLUS LANCETS 28G MISC by Does not apply route as needed.        . TRUETEST TEST test strip USE TWICE DAILY TO CHECK BLOOD SUGARS  200 each  1  .  Calcium Carbonate-Vitamin D (CVS CALCIUM 600 + D PO) Take by mouth daily.        Marland Kitchen JANUVIA 100 MG tablet TAKE 1 TABLET BY MOUTH DAILY  30 tablet  6   No facility-administered medications prior to visit.   No Known Allergies Patient Active Problem List  Diagnosis  . HYPOTHYROIDISM  . DIABETES MELLITUS, TYPE II  . HYPERLIPIDEMIA  . ACUTE GOUTY ARTHROPATHY  . ANXIETY DISORDER  . MULTIPLE SCLEROSIS  . HYPERTENSION  . PULMONARY EMBOLISM  . GERD  . MENOPAUSAL SYNDROME  . SINUS TARSI SYNDROME  . PLANTAR FASCIITIS, RIGHT  . TUBULOVILLOUS ADENOMA, COLON, HX OF  . TRANSAMINASES, SERUM, ELEVATED  . IBS  . NEUROPATHY  . Thyroid nodule  . Acute gouty arthritis  . Multiple skin nodules  . Hand pain, left  . Fatty liver  . H/o Lyme disease   History  Substance Use Topics  . Smoking status: Never Smoker   . Smokeless tobacco: Never Used  . Alcohol Use: No     family history includes Breast cancer (age of onset: 53) in her mother; Colon cancer in her mother; Diabetes in her maternal grandmother and paternal grandmother; and Stomach cancer in her father.  There is no history of Esophageal cancer and Rectal cancer.  Objective:   Physical Exam Well-developed white female in no acute distress, pleasant blood pressure 132/74 pulse 70 height 5 foot 5 weight 207. HEENT; nontraumatic normocephalic EOMI PERRLA sclera anicteric,Neck; Supple no JVD, Cardiovascular; regular rate and rhythm with S1-S2 she has a mid systolic click. Pulmonary; clear bilaterally, Abdomen; soft tender in the epigastrium and right upper quadrant there is no guarding or rebound no palpable mass or hepatosplenomegaly bowel sounds are active she does have a upper midline incisional hernia which had been previously repaired, Rectal exam not done, Ext; no clubbing cyanosis or edema skin warm and dry, Psych; mood and affect normal and appropriate       Assessment & Plan:  #47 57 year old female with history of hepatic steatosis  and intermittently mild we elevated transaminases now presenting with one-week history of epigastric and right upper quadrant pain radiating into the back and associated with nausea chills and clay-colored stools. Symptomatically improving over the past 24 hours. Does not appear jaundiced She is status post cholecystectomy. Will need to rule out bile duct obstruction or other cholestatic process. #2 multiple sclerosis #3 status post right hemicolectomy secondary to large tubovillous adenoma #4 is post cholecystectomy #5 ventral hernia recurrent after repair with mesh #6 GERD #7 recent Lyme disease with prolonged course of antibiotic #8 hyperlipidemia  Plan; will check CBC with differential C. met  and lipase today Discussed imaging with patient,, she is concerned about both the cost and radiation she currently has a very high deductible. Will review labs first, discussed with Dr. Leone Payor and then decide regarding need for any further imaging area She did bring with her a copy of an ultrasound done at [facility] in September of 2013 this showed a fatty liver normal common bile duct at 4 mm and the visualized portion of the pancreas unremarkable

## 2012-08-31 NOTE — Telephone Encounter (Signed)
Patient with clay colored stools, nausea, vomiting, and abdominal pain.  History of fatty liver.  She will come in and see Amy Esterwood PA today at 3:00

## 2012-08-31 NOTE — Patient Instructions (Addendum)
Please go to the basement level to have your labs drawn.  We will call you with the results. 

## 2012-09-01 ENCOUNTER — Telehealth: Payer: Self-pay | Admitting: Family Medicine

## 2012-09-01 ENCOUNTER — Encounter: Payer: Self-pay | Admitting: Internal Medicine

## 2012-09-01 NOTE — Telephone Encounter (Signed)
This encounter was created in error - please disregard.

## 2012-09-01 NOTE — Progress Notes (Signed)
I have reviewed labs and this note. With her hx IBS quite likely from that. Should be safe to hold off on imaging. Could try anti-spasmodic if desired though on multiple meds already.  If she has other ?'s for me I can call her by next week.

## 2012-09-01 NOTE — Telephone Encounter (Signed)
° °  Prior authorization for Benicar HCT 20/12.5 faxed to [facility]

## 2012-09-02 ENCOUNTER — Telehealth: Payer: Self-pay | Admitting: Physician Assistant

## 2012-09-02 NOTE — Telephone Encounter (Signed)
Spoke with patient and told her Mike Gip, [place] has forwarded her labs to Dr. Leone Payor for review and we will call her with results after he reviews them.

## 2012-09-06 ENCOUNTER — Ambulatory Visit: Payer: BC Managed Care – PPO | Admitting: Family Medicine

## 2012-09-06 ENCOUNTER — Encounter: Payer: Self-pay | Admitting: *Deleted

## 2012-09-06 ENCOUNTER — Telehealth: Payer: Self-pay | Admitting: Physician Assistant

## 2012-09-06 VITALS — BP 150/90 | HR 72 | Ht 65.0 in | Wt 205.0 lb

## 2012-09-06 DIAGNOSIS — I10 Essential (primary) hypertension: Secondary | ICD-10-CM

## 2012-09-06 DIAGNOSIS — Z86718 Personal history of other venous thrombosis and embolism: Secondary | ICD-10-CM

## 2012-09-06 DIAGNOSIS — M25569 Pain in unspecified knee: Secondary | ICD-10-CM

## 2012-09-06 DIAGNOSIS — K7689 Other specified diseases of liver: Secondary | ICD-10-CM

## 2012-09-06 LAB — D-DIMER, QUANTITATIVE: D-Dimer, Quant: 0.36 ug/mL-FEU (ref 0.00–0.48)

## 2012-09-06 NOTE — Patient Instructions (Signed)
Take 1/2 tablet daily of Benicar HCT (samples given were higher dose). Check BP's regularly, and let us know if not coming down to usual values.  We will contact you with D-dimer results.  If abnormal, you will be sent for a duplex ultrasound of the leg to rule out DVT. If normal, likely is related to hamstrings/tendons--do stretches, moist heat and anti-inflammatories as needed for discomfort/pain.

## 2012-09-06 NOTE — Progress Notes (Signed)
Chief Complaint  Patient presents with  . Advice Only    for the last 3 days she has felt like she has a blood clot behind her right knee, has fatty liver-sees Dr.Gessner, he is OOT and will be back tomorrow and is supposed to call her but she is very worried. Also her insurance will no longer cover her Benicar, she goes on Medicare in May, can we give her samples? We only have the 40/25 can she cut in half?   Patient presents with complaint of possible DVT RLE.  She has dull ache behind right knee x 3 days.  Last night it was more painful.  Denies any swelling in the foot.  Denies change in activity, shoes, or other etiology to cause knee pain. H/o DVT in past, after childbirth (postpartum).  +family h/o of recurrent DVT's and PE's in her mother, who has been on blood thinners x 30 years.  No known underlying disorder was found.  She denies any recent immobilization, long trips, etc.  HTN--hasn't taken Benicar HCT today.  She has been cutting them in half to make them last longer, but BP's at home have been okay per pt.  This medication will be covered in May, asking for samples to help cover until then.  Recent problems with abdominal pain, which radiated to her back, as well as nausea, dizziness.  Stools went white in color.  Labs were done by Dr. Leone Payor, waiting on results.  Since then pain has improved, stools back to normal.  She is asking about her results.  Past Medical History  Diagnosis Date  . PULMONARY EMBOLISM 11/2006    post op  . DEEP VENOUS THROMBOPHLEBITIS 11/2006    post surg, anticoag x 6 mo  . DIABETES MELLITUS, TYPE II     Dr. Sharl Ma  . GERD   . HYPERLIPIDEMIA   . HYPERTENSION   . NEUROPATHY     related to MS  . HYPOTHYROIDISM     Dr. Sharl Ma  . MENOPAUSAL SYNDROME   . Multiple sclerosis     Dr. Leotis Shames (in [place])  . SINUS TARSI SYNDROME   . IBS   . TRANSAMINASES, SERUM, ELEVATED     Dr. Leone Payor, referred to [facility]  . Complication of anesthesia     difficulty waking   . PONV (postoperative nausea and vomiting)   . TUBULOVILLOUS ADENOMA, COLON, HX OF 09/21/2008    Annotation: cecum, with high-grade dysplasia original colonoscopy Ganem resected by Dr. Michaell Cowing post-op leak and complicated course Qualifier: Diagnosis of  By: Leone Payor MD, Charlyne Quale   . Colon polyps     hyperplastic; Dr. Leone Payor  . Gout    Past Surgical History  Procedure Laterality Date  . Laraoscopic cecal polyp resection  11/2006    Right  . Ex lap and ileostomy/hartmanns pouch for anatamotic leak  11/2006  . Ileostomy reversal  04/2007  . Cholecystectomy    . Cesarean section      x's 2  . Abdominoplasty    . Thyroidectomy, partial  1990    right  . Colonoscopy      multiple  . Colonoscopy  11/05/2011    Procedure: COLONOSCOPY;  Surgeon: [name], MD;  Location: [facility];  Service: Endoscopy;  Laterality: N/A;   History   Social History  . Marital Status: Married    Spouse Name: N/A    Number of Children: 3  . Years of Education: N/A   Occupational History  .  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 Smoking status: Never Smoker   . Smokeless tobacco: Never Used  . Alcohol Use: No  . Drug Use: No  . Sexually Active: Not on file   Other Topics Concern  . Not on file   Social History Narrative   Lives with husband, 1 dog.  Daughter Carollee Herter) lives in [place].  Son in [place] and son in [place].   Current Outpatient Prescriptions on File Prior to Visit  Medication Sig Dispense Refill  . aspirin 81 MG tablet Take 81 mg by mouth daily.       . diphenoxylate-atropine (LOMOTIL) 2.5-0.025 MG per tablet TAKE 1 TO 2 TABLETS BY MOUTH EVERY 6 HOURS AS NEEDED FOR DIARRHEA  540 tablet  0  . fish oil-omega-3 fatty acids 1000 MG capsule Take 2 g by mouth daily.      . furosemide (LASIX) 20 MG tablet Take 1 tablet (20 mg total) by mouth daily.  90 tablet  1  . levothyroxine (SYNTHROID, LEVOTHROID) 137 MCG tablet Take 137  mcg by mouth daily.      . metFORMIN (GLUCOPHAGE) 500 MG tablet Take 500 mg by mouth 2 (two) times daily with a meal.      . modafinil (PROVIGIL) 200 MG tablet Take 1 tablet (200 mg total) by mouth 2 (two) times daily as needed.  60 tablet  3  . Multiple Vitamin (MULTIVITAMIN) tablet Take 1 tablet by mouth daily.        Marland Kitchen olmesartan-hydrochlorothiazide (BENICAR HCT) 20-12.5 MG per tablet Take 1 tablet by mouth daily.  90 tablet  1  . omeprazole (PRILOSEC) 20 MG capsule Take 1 capsule (20 mg total) by mouth daily.  90 capsule  1  . pravastatin (PRAVACHOL) 20 MG tablet Take 1 tablet (20 mg total) by mouth daily.  90 tablet  0  . propranolol ER (INDERAL LA) 120 MG 24 hr capsule TAKE 1 CAPSULE BY MOUTH EVERY DAY  30 capsule  5  . TRUEPLUS LANCETS 28G MISC by Does not apply route as needed.        . TRUETEST TEST test strip USE TWICE DAILY TO CHECK BLOOD SUGARS  200 each  1  . acyclovir (ZOVIRAX) 400 MG tablet Take 400 mg by mouth 3 (three) times daily as needed.        . colchicine (COLCRYS) 0.6 MG tablet Take 2 tablets at onset of gout attack.  Take additional 1 tablet in 1 hour, if needed  30 tablet  1  . triamcinolone cream (KENALOG) 0.1 % Apply topically 2 (two) times daily. Use sparingly to affected area  30 g  0   No current facility-administered medications on file prior to visit.   No Known Allergies  ROS:  Denies fevers, URI symptoms, chest pain, cough, shortness of breath, edema, rashes, bleeding/bruising, swelling or other complaints except as noted in HPI.  Abdominal pain resolved, stools normal.  PHYSICAL EXAM: BP 150/90  Pulse 72  Ht 5\' 5"  (1.651 m)  Wt 205 lb (92.987 kg)  BMI 34.11 kg/m2 Pleasant obese female in no distress  R leg--no edema, normal pulses.  Skin is dry.  There is no swelling or asymmetry in thigh or calf.  No bulge or tenderness in popliteal fossa.  Knee is without effusion, warmth, has FROM without crepitus or pain.  There is some point tenderness in distal  portion of hamstrings posteriorly (center  of posterior thigh, above popliteal fossa)--no cord or knot palpable. Some pain with hamstring stretch.  Negative Homan sign. 2+ pulses.  Some varicosities noted in LLE, not right.  Lab Results  Component Value Date   ALT 103* 08/31/2012   AST 62* 08/31/2012   ALKPHOS 94 08/31/2012   BILITOT 0.6 08/31/2012   Lab Results  Component Value Date   LIPASE 23.0 08/31/2012   Lab Results  Component Value Date   WBC 7.4 08/31/2012   HGB 13.7 08/31/2012   HCT 40.2 08/31/2012   MCV 87.6 08/31/2012   PLT 300.0 08/31/2012   ASSESSMENT/PLAN:  Pain in joint, lower leg, right - Plan: D-dimer, quantitative  Personal history of DVT (deep vein thrombosis) - Plan: D-dimer, quantitative  Fatty liver  HYPERTENSION  Pt with h/o DVT, complaining of similar/recurrent symptoms.  Exam does not show evidence of DVT, but cannot exclude early or partial occlusion of vein (unlikely).  Given her history, will check D-dimer as a screen.  If abnormal, will need u/s, and if clot, will need to have hypercoaguable panel done (? If done many years ago).  If negative, reassure pt, likely hamstring tendons--shown stretches, NSAIDs prn.  Lab results reviewed--elevated LFT's, higher than last check, but lower than 6-7 months ago.  Normal lipase and CBC.  Pt was reassured (she was very anxious about results)  HTN--elevated today--likely contributed by taking less than usual dose, and pain/anxiety today. Samples given of 40/25--pt to take 1/2 tablet daily, and continue to monitor BP at home.  Can call for more samples when needed, until insurance covers in May

## 2012-09-06 NOTE — Telephone Encounter (Signed)
Patient to call PCP for this.

## 2012-09-13 ENCOUNTER — Telehealth: Payer: Self-pay | Admitting: Internal Medicine

## 2012-09-13 ENCOUNTER — Ambulatory Visit: Payer: BC Managed Care – PPO | Admitting: Internal Medicine

## 2012-09-13 NOTE — Telephone Encounter (Signed)
Spoke with patient and she is asking to r/s OV with Dr. Leone Payor that she had to cancel due to family emergency. Scheduled on 09/21/12 at 2:15 PM.

## 2012-09-14 NOTE — Telephone Encounter (Signed)
PER NOTES LAST OV, BENICAR NOT APPROVED, PT GIVEN SAMPLES OF BENICAR TO HOLD TIL INS CHANGE IN MAY

## 2012-09-15 ENCOUNTER — Telehealth: Payer: Self-pay | Admitting: Family Medicine

## 2012-09-21 ENCOUNTER — Encounter: Payer: Self-pay | Admitting: Internal Medicine

## 2012-09-21 ENCOUNTER — Ambulatory Visit (INDEPENDENT_AMBULATORY_CARE_PROVIDER_SITE_OTHER): Payer: BC Managed Care – PPO | Admitting: Internal Medicine

## 2012-09-21 VITALS — BP 122/82 | HR 87 | Ht 65.0 in | Wt 205.0 lb

## 2012-09-21 DIAGNOSIS — F411 Generalized anxiety disorder: Secondary | ICD-10-CM

## 2012-09-21 DIAGNOSIS — K760 Fatty (change of) liver, not elsewhere classified: Secondary | ICD-10-CM

## 2012-09-21 DIAGNOSIS — K589 Irritable bowel syndrome without diarrhea: Secondary | ICD-10-CM

## 2012-09-21 DIAGNOSIS — K7689 Other specified diseases of liver: Secondary | ICD-10-CM

## 2012-09-21 MED ORDER — VITAMIN E 180 MG (400 UNIT) PO CAPS: 400.0000 [IU] | ORAL_CAPSULE | Freq: Every day | ORAL | Status: DC

## 2012-09-21 NOTE — Progress Notes (Addendum)
  Subjective:    Patient ID: [name], female    DOB: 01/28/1956, 57 y.o.   MRN: [id number]  HPI Elita Quick returns to discuss recent RUQ pain, light-colored stools and abnormal transaminases. Her sxs are resolved. She is concerned about her abnormal transaminases and dx of fatty liver. We reviewed her 2013 [facility] evaluation also.  Describes intermittent mucous in her stools ever since her colon resection.  Medications, allergies, past medical history, past surgical history, family history and social history are reviewed and updated in the EMR.  Review of Systems Anxious about health problems, about to go on Medicare    Objective:   Physical Exam NAD - mildly obese Abdomen is soft and non-tender w/o hepatomegaly No results found for this basename: [id number]   LFT's    Assessment & Plan:  1. Fatty liver disease, nonalcoholic  2. IBS (irritable bowel syndrome)  3. Health-Related Anxiety  1. We discussed the nature of fatty liver disease and that it rarely progresses to cirrhosis, but can. Cannot tell what mildly nodular liver on [place] means at this poin but she could have fibrosis - and developing cirrhosis. I explained that there are no proven treatments beyond weight loss and diet change. She is trying to lose weight and is avoiding high fructose corn syrup. 2. Discused 3 options - watchful waiting, evaluation with fibroSure blood test (can indicate fibrosis) or liver biopsy. Problem is that testing may not change what treatment if any. She wants to see what cost of FibroSure would be ? "covered" 3. Will contact her with answer to coverage ? 4. She will let me know how much Vitamin E she is on 5. Reassured re: IBS 6. Follow-up US later this year - September probably 7. Would treat lipids aggressively as can help fatty liver - do not think she is at increased risk of liver problems from statins 8. As to RUQ pain and stool color change - ? Viral syndrome, IBS fluctuation, fatty liver  "flare" with capsular pain. Resolved now. 9. Need to provide copies of labs vs. Enrolling in My Chart  NG:EXBMW,UXL A, MD

## 2012-09-21 NOTE — Patient Instructions (Addendum)
Please call us back at [phone] with the exact dose of your Vitamin E please.  Continue your weight loss efforts.  We will check into the fibrosure lab test  and be in touch.  Thank you for choosing me and [facility].  [name], M.D., [facility]

## 2012-09-22 ENCOUNTER — Telehealth: Payer: Self-pay | Admitting: Internal Medicine

## 2012-09-22 NOTE — Telephone Encounter (Signed)
OK  Please check if the NASH Fibrosure test from [employer] is covered for her insurance  Thanks

## 2012-09-22 NOTE — Telephone Encounter (Signed)
Information you requested yesterday Sir.

## 2012-09-23 NOTE — Telephone Encounter (Signed)
Per BCBS, these studies are investigational and are not covered by [employer]. It appears that our cost for NASH FibroSURE is $190.00. I have placed BCBS info on your desk for your review if you would like to look at it.

## 2012-09-28 NOTE — Telephone Encounter (Signed)
OK  Let her know this info and she can decide if she wants to do this test, liver bx or just observe - I think observing is ok.

## 2012-09-29 NOTE — Telephone Encounter (Signed)
Spoke to patient and she is feeling better and agrees with Dr. Georga Hacking plan to just observe.

## 2012-09-30 ENCOUNTER — Telehealth: Payer: Self-pay | Admitting: Family Medicine

## 2012-09-30 NOTE — Telephone Encounter (Signed)
ok 

## 2012-09-30 NOTE — Telephone Encounter (Signed)
DR KNAPP DO YOU WANT TO SWITCH PT TO COVERED ALTERNATIVE?

## 2012-09-30 NOTE — Telephone Encounter (Signed)
She is aware that med isn't covered.  She reports that it WILL be covered on her new insurance in May.  She has asked if we could provide samples until May.  If we are able to do this, then we do not need to provide substitute.  Substitute will be needed only if we don't have samples.  (this is what we discussed at her last visit here--I'm not sure where we are with how many samples we have given or how much more she will need)

## 2012-10-04 MED ORDER — OLMESARTAN MEDOXOMIL-HCTZ 20-12.5 MG PO TABS: 1.0000 | ORAL_TABLET | Freq: Every day | ORAL | Status: DC

## 2012-10-04 NOTE — Telephone Encounter (Signed)
SAMPLES GIVEN PER DR KNAPP

## 2012-10-05 NOTE — Telephone Encounter (Signed)
LM

## 2012-10-28 ENCOUNTER — Telehealth: Payer: Self-pay | Admitting: Internal Medicine

## 2012-10-28 DIAGNOSIS — E785 Hyperlipidemia, unspecified: Secondary | ICD-10-CM

## 2012-10-28 MED ORDER — PRAVASTATIN SODIUM 20 MG PO TABS: 20.0000 mg | ORAL_TABLET | Freq: Every day | ORAL | Status: DC

## 2012-10-28 NOTE — Telephone Encounter (Signed)
Done

## 2012-10-28 NOTE — Telephone Encounter (Signed)
Pt would like a refill for pravastatin 20mg   #90 to [facility]

## 2012-11-16 ENCOUNTER — Telehealth: Payer: Self-pay | Admitting: Family Medicine

## 2012-11-16 DIAGNOSIS — I10 Essential (primary) hypertension: Secondary | ICD-10-CM

## 2012-11-16 MED ORDER — PROPRANOLOL HCL ER 120 MG PO CP24: ORAL_CAPSULE | ORAL | Status: DC

## 2012-11-16 NOTE — Telephone Encounter (Signed)
done

## 2012-11-16 NOTE — Telephone Encounter (Signed)
DR KNAPP IS THIS OK IT WAS FILLED BY SOMEONE ELSE BEFORE

## 2012-11-18 ENCOUNTER — Other Ambulatory Visit: Payer: Self-pay

## 2012-11-18 DIAGNOSIS — Z1231 Encounter for screening mammogram for malignant neoplasm of breast: Secondary | ICD-10-CM

## 2012-11-24 ENCOUNTER — Encounter: Payer: Self-pay | Admitting: Family Medicine

## 2012-12-01 ENCOUNTER — Encounter: Payer: Self-pay | Admitting: Family Medicine

## 2012-12-01 ENCOUNTER — Ambulatory Visit (INDEPENDENT_AMBULATORY_CARE_PROVIDER_SITE_OTHER): Payer: Medicare Other | Admitting: Family Medicine

## 2012-12-01 VITALS — BP 120/74 | HR 72 | Ht 65.0 in | Wt 200.0 lb

## 2012-12-01 DIAGNOSIS — E785 Hyperlipidemia, unspecified: Secondary | ICD-10-CM

## 2012-12-01 DIAGNOSIS — I10 Essential (primary) hypertension: Secondary | ICD-10-CM

## 2012-12-01 DIAGNOSIS — K760 Fatty (change of) liver, not elsewhere classified: Secondary | ICD-10-CM

## 2012-12-01 DIAGNOSIS — K7689 Other specified diseases of liver: Secondary | ICD-10-CM

## 2012-12-01 MED ORDER — LOSARTAN POTASSIUM-HCTZ 50-12.5 MG PO TABS: 1.0000 | ORAL_TABLET | Freq: Every day | ORAL | Status: DC

## 2012-12-01 NOTE — Progress Notes (Signed)
Chief Complaint  Patient presents with  . Hypertension    fasting med check, sees Dr.Kerr for diabetes next month.    Hypertension follow-up:  Blood pressures elsewhere are 120's/70-80's at home.  Denies dizziness, headaches, chest pain.  Denies side effects of medications. She is riding exercise bike 20-30 minutes daily, plus some walking outside if it isn't too hot.  Hyperlipidemia follow-up:  Patient is reportedly following a low-fat, low cholesterol diet.  Compliant with medications and denies medication side effects. Taking 2400mg  of fish oil daily.  She is tracking her diet daily (calories, fat grams and carbs).  She had lost 8 pounds, but gained 3 back with recent trip to the beach.  She went to [employer], had caramel apples, had some fried shrimp--she didn't track her foods while away.  Just returned last night.  Past Medical History  Diagnosis Date  . PULMONARY EMBOLISM 11/2006    post op  . DEEP VENOUS THROMBOPHLEBITIS 11/2006    post surg, anticoag x 6 mo  . DIABETES MELLITUS, TYPE II     Dr. Sharl Ma  . GERD   . HYPERLIPIDEMIA   . HYPERTENSION   . NEUROPATHY     related to MS  . HYPOTHYROIDISM     Dr. Sharl Ma  . MENOPAUSAL SYNDROME   . Multiple sclerosis     Dr. Leotis Shames (in [place])  . SINUS TARSI SYNDROME   . IBS   . TRANSAMINASES, SERUM, ELEVATED     Dr. Leone Payor, referred to [facility]- FATTY LIVER  . Complication of anesthesia     difficulty waking  . PONV (postoperative nausea and vomiting)   . TUBULOVILLOUS ADENOMA, COLON, HX OF 09/21/2008    Annotation: cecum, with high-grade dysplasia original colonoscopy Ganem resected by Dr. Michaell Cowing post-op leak and complicated course Qualifier: Diagnosis of  By: Leone Payor MD, Charlyne Quale   . Gout   . Fatty liver disease, nonalcoholic 08/01/2010        Past Surgical History  Procedure Laterality Date  . Laraoscopic cecal polyp resection  11/2006    Right  . Ex lap and ileostomy/hartmanns pouch for anatamotic leak  11/2006  . Ileostomy  reversal  04/2007  . Cholecystectomy    . Cesarean section      x's 2  . Abdominoplasty    . Thyroidectomy, partial  1990    right  . Colonoscopy      multiple  . Colonoscopy  11/05/2011    Procedure: COLONOSCOPY;  Surgeon: [name], MD;  Location: [facility];  Service: Endoscopy;  Laterality: N/A;   History   Social History  . Marital Status: Married    Spouse Name: N/A    Number of Children: 3  . Years of Education: N/A   Occupational History  . 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 Smoking status: Never Smoker   . Smokeless tobacco: Never Used  . Alcohol Use: No  . Drug Use: No  . Sexually Active: Not on file   Other Topics Concern  . Not on file   Social History Narrative   Lives with husband, 1 dog.  Daughter Carollee Herter) lives in [place].  Son in [place] and son in [place].   Current outpatient prescriptions:aspirin 81 MG tablet, Take 81 mg by mouth daily. , Disp: , Rfl: ;  fish oil-omega-3 fatty acids 1000 MG capsule, Take 2 g by mouth daily., Disp: , Rfl: ;  furosemide (  LASIX) 20 MG tablet, Take 1 tablet (20 mg total) by mouth daily., Disp: 90 tablet, Rfl: 1;  levothyroxine (SYNTHROID, LEVOTHROID) 137 MCG tablet, Take 137 mcg by mouth daily., Disp: , Rfl:  metFORMIN (GLUCOPHAGE) 500 MG tablet, Take 500 mg by mouth 2 (two) times daily with a meal., Disp: , Rfl: ;  modafinil (PROVIGIL) 200 MG tablet, Take 1 tablet (200 mg total) by mouth 2 (two) times daily as needed., Disp: 60 tablet, Rfl: 3;  Multiple Vitamin (MULTIVITAMIN) tablet, Take 1 tablet by mouth daily.  , Disp: , Rfl: ;  omeprazole (PRILOSEC) 20 MG capsule, Take 1 capsule (20 mg total) by mouth daily., Disp: 90 capsule, Rfl: 1 pravastatin (PRAVACHOL) 20 MG tablet, Take 1 tablet (20 mg total) by mouth daily., Disp: 90 tablet, Rfl: 0;  propranolol ER (INDERAL LA) 120 MG 24 hr capsule, TAKE 1 CAPSULE BY MOUTH EVERY DAY, Disp: 90 capsule, Rfl: 0;  TRUEPLUS  LANCETS 28G MISC, by Does not apply route as needed.  , Disp: , Rfl: ;  TRUETEST TEST test strip, USE TWICE DAILY TO CHECK BLOOD SUGARS, Disp: 200 each, Rfl: 1 vitamin E (VITAMIN E-200) 200 UNIT capsule, Take 200 Units by mouth daily., Disp: , Rfl: ;  acyclovir (ZOVIRAX) 400 MG tablet, Take 400 mg by mouth 3 (three) times daily as needed.  , Disp: , Rfl: ;  colchicine (COLCRYS) 0.6 MG tablet, Take 2 tablets at onset of gout attack.  Take additional 1 tablet in 1 hour, if needed, Disp: 30 tablet, Rfl: 1 diphenoxylate-atropine (LOMOTIL) 2.5-0.025 MG per tablet, TAKE 1 TO 2 TABLETS BY MOUTH EVERY 6 HOURS AS NEEDED FOR DIARRHEA, Disp: 540 tablet, Rfl: 0;  losartan-hydrochlorothiazide (HYZAAR) 50-12.5 MG per tablet, Take 1 tablet by mouth daily., Disp: 90 tablet, Rfl: 1;  triamcinolone cream (KENALOG) 0.1 %, Apply topically 2 (two) times daily. Use sparingly to affected area, Disp: 30 g, Rfl: 0  No Known Allergies  ROS:  Denies fevers, chest pain, palpitations, nausea, vomiting.  +fatigue in the afternoons, related to MS. +tremor due to MS, controlled with meds.  Moods are good.  No cough, shortness of breath, headaches, syncope, rashes, bleeding/bruising or other concerns.  See HPI.  PHYSICAL EXAM: BP 120/74  Pulse 72  Ht 5\' 5"  (1.651 m)  Wt 200 lb (90.719 kg)  BMI 33.28 kg/m2 120/74 on repeat by MD, RA Pleasant overweight female in no distress Neck: no lymphadenopathy, thyromegaly or mass Heart: regular rate and rhythm without murmur Lungs: clear bilaterally Abdomen: soft, nontender, no mass Extremities: no edema, 2+ pulse Psych: normal mood, affect, hygiene and grooming Neuro: +resting head tremor.  Alert and oriented.  Cranial nerves intact.  Normal gait.  ASSESSMENT/PLAN:  HYPERLIPIDEMIA - Plan: Lipid panel  Fatty liver disease, nonalcoholic - elevated transaminases - Plan: Hepatic function panel  HYPERTENSION - change from Benicar HCT to generic for cost purposes.  check BP's at home,  and call to have dose increased if BP high - Plan: losartan-hydrochlorothiazide (HYZAAR) 50-12.5 MG per tablet  Expect TG's might still be high due to change in diet with recent vacation.  If so, will give her some time to get back to normal, lowfat diet, will then advise to increase fish oil to 4000mg  daily (if needed) and recheck in 3 months, rather than 6.  If lipid panel is good, okay to wait and recheck in 6 months.  Recheck LFT's today, and send copy to Dr. Leone Payor.  Last check 2-3 months ago was a  little higher than what she had been running the last few times.  Overall stomach is doing okay (had abdominal pain radiating to her shoulders for about 3-4 weeks, resolved).  She subsequently lowered her fat intake

## 2012-12-01 NOTE — Patient Instructions (Signed)
Continue daily exercise, weight loss. We will be in touch with your results.  We may ask you to increase the fish oil to 3000-4000 mg daily.  Stop the Benicar HCT and try the losartan HCT instead.  Continue to monitor BP at home.  Let us know if the new medication doesn't seem to be as effective--ie if BP's are running consistently >135/85.

## 2012-12-02 LAB — LIPID PANEL
Cholesterol: 162 mg/dL (ref 0–200)
HDL: 41 mg/dL (ref >39)
Total CHOL/HDL Ratio: 4 Ratio

## 2012-12-02 LAB — HEPATIC FUNCTION PANEL
ALT: 61 U/L — ABNORMAL HIGH (ref 0–35)
AST: 44 U/L — ABNORMAL HIGH (ref 0–37)
Albumin: 4.1 g/dL (ref 3.5–5.2)

## 2012-12-03 ENCOUNTER — Other Ambulatory Visit: Payer: Self-pay | Admitting: *Deleted

## 2012-12-03 DIAGNOSIS — Z79899 Other long term (current) drug therapy: Secondary | ICD-10-CM

## 2012-12-03 DIAGNOSIS — E785 Hyperlipidemia, unspecified: Secondary | ICD-10-CM

## 2012-12-09 ENCOUNTER — Telehealth: Payer: Self-pay | Admitting: Internal Medicine

## 2012-12-09 DIAGNOSIS — K219 Gastro-esophageal reflux disease without esophagitis: Secondary | ICD-10-CM

## 2012-12-09 DIAGNOSIS — R609 Edema, unspecified: Secondary | ICD-10-CM

## 2012-12-09 MED ORDER — OMEPRAZOLE 20 MG PO CPDR: 20.0000 mg | DELAYED_RELEASE_CAPSULE | Freq: Every day | ORAL | Status: DC

## 2012-12-09 MED ORDER — FUROSEMIDE 20 MG PO TABS: 20.0000 mg | ORAL_TABLET | Freq: Every day | ORAL | Status: DC

## 2012-12-09 NOTE — Telephone Encounter (Signed)
Pt needs a refill on furosemide 40mg  and omeprazole 20mg  to [facility]. Pt would like both rx to be a 90 day supply

## 2012-12-09 NOTE — Telephone Encounter (Signed)
Done

## 2012-12-31 ENCOUNTER — Ambulatory Visit
Admission: RE | Admit: 2012-12-31 | Discharge: 2012-12-31 | Disposition: A | Discharge status: Home / Self Care | Payer: Medicare Other | Source: Ambulatory Visit

## 2012-12-31 DIAGNOSIS — Z1231 Encounter for screening mammogram for malignant neoplasm of breast: Secondary | ICD-10-CM

## 2013-01-28 ENCOUNTER — Other Ambulatory Visit: Payer: Self-pay | Admitting: Family Medicine

## 2013-02-16 ENCOUNTER — Other Ambulatory Visit: Payer: Self-pay | Admitting: Family Medicine

## 2013-02-23 LAB — HM DIABETES EYE EXAM

## 2013-02-25 ENCOUNTER — Encounter: Payer: Self-pay | Admitting: Internal Medicine

## 2013-04-01 ENCOUNTER — Encounter: Payer: Self-pay | Admitting: *Deleted

## 2013-04-06 ENCOUNTER — Ambulatory Visit: Payer: Medicare Other | Admitting: Family Medicine

## 2013-04-20 ENCOUNTER — Encounter: Payer: Self-pay | Admitting: Family Medicine

## 2013-04-20 ENCOUNTER — Ambulatory Visit (INDEPENDENT_AMBULATORY_CARE_PROVIDER_SITE_OTHER): Payer: Medicare Other | Admitting: Family Medicine

## 2013-04-20 VITALS — BP 138/86 | HR 80 | Ht 65.0 in | Wt 198.0 lb

## 2013-04-20 DIAGNOSIS — Z79899 Other long term (current) drug therapy: Secondary | ICD-10-CM

## 2013-04-20 DIAGNOSIS — E785 Hyperlipidemia, unspecified: Secondary | ICD-10-CM

## 2013-04-20 DIAGNOSIS — Z23 Encounter for immunization: Secondary | ICD-10-CM

## 2013-04-20 DIAGNOSIS — Z0000 Encounter for general adult medical examination without abnormal findings: Secondary | ICD-10-CM

## 2013-04-20 DIAGNOSIS — R5381 Other malaise: Secondary | ICD-10-CM

## 2013-04-20 DIAGNOSIS — G35 Multiple sclerosis: Secondary | ICD-10-CM

## 2013-04-20 DIAGNOSIS — G4700 Insomnia, unspecified: Secondary | ICD-10-CM

## 2013-04-20 DIAGNOSIS — I10 Essential (primary) hypertension: Secondary | ICD-10-CM

## 2013-04-20 LAB — LIPID PANEL
HDL: 41 mg/dL (ref >39)
LDL Cholesterol: 72 mg/dL (ref 0–99)
Total CHOL/HDL Ratio: 4.5 Ratio
Triglycerides: 352 mg/dL — ABNORMAL HIGH (ref <150)
VLDL: 70 mg/dL — ABNORMAL HIGH (ref 0–40)

## 2013-04-20 LAB — POCT URINALYSIS DIPSTICK
Blood, UA: NEGATIVE
Nitrite, UA: NEGATIVE
Protein, UA: NEGATIVE
Spec Grav, UA: 1.01
Urobilinogen, UA: NEGATIVE
pH, UA: 5

## 2013-04-20 LAB — HEPATIC FUNCTION PANEL
Albumin: 4.7 g/dL (ref 3.5–5.2)
Alkaline Phosphatase: 84 U/L (ref 39–117)
Total Protein: 7.5 g/dL (ref 6.0–8.3)

## 2013-04-20 NOTE — Patient Instructions (Addendum)
HEALTH MAINTENANCE RECOMMENDATIONS:  It is recommended that you get at least 30 minutes of aerobic exercise at least 5 days/week (for weight loss, you may need as much as 60-90 minutes). This can be any activity that gets your heart rate up. This can be divided in 10-15 minute intervals if needed, but try and build up your endurance at least once a week.  Weight bearing exercise is also recommended twice weekly.  Eat a healthy diet with lots of vegetables, fruits and fiber.  "Colorful" foods have a lot of vitamins (ie green vegetables, tomatoes, red peppers, etc).  Limit sweet tea, regular sodas and alcoholic beverages, all of which has a lot of calories and sugar.  Up to 1 alcoholic drink daily may be beneficial for women (unless trying to lose weight, watch sugars).  Drink a lot of water.  Calcium recommendations are 1200-1500 mg daily (1500 mg for postmenopausal women or women without ovaries), and vitamin D 1000 IU daily.  This should be obtained from diet and/or supplements (vitamins), and calcium should not be taken all at once, but in divided doses.  Monthly self breast exams and yearly mammograms for women over the age of 39 is recommended.  Sunscreen of at least SPF 30 should be used on all sun-exposed parts of the skin when outside between the hours of 10 am and 4 pm (not just when at beach or pool, but even with exercise, golf, tennis, and yard work!)  Use a sunscreen that says "broad spectrum" so it covers both UVA and UVB rays, and make sure to reapply every 1-2 hours.  Remember to change the batteries in your smoke detectors when changing your clock times in the spring and fall.  Use your seat belt every time you are in a car, and please drive safely and not be distracted with cell phones and texting while driving.  Call employee health at [facility] to check if received Td vs Tdap in 2007  Insomnia--try 50mg  diphenhydramine at bedtime. Can also try melatonin nightly.   Call for a  prescription for Ambien if this isn't effective (just to use sporadically).

## 2013-04-20 NOTE — Progress Notes (Signed)
Chief Complaint  Patient presents with  . Annual Exam    fasting annual exam with pap. Med check. Only thing she did want to discuss with you today is that is having insomnia 1-2 times per week, worse than last year. Did not do eye exam as she just one and does have rx for new glasses.     [name] is a 57 y.o. female who presents for a complete physical and med check/follow-up on chronic medical problems.  She has the following concerns:  She is complaining of having trouble sleeping in the last couple of years, but just every 3 weeks, just since going through menopause.  Over the last year it has been getting worse, and will be up until 1-2 am once or twice a week.  She tried OTC diphenhydramine without any help, admits to only using 1/2-1 tablet of lowest dose. She tried another herbal medication (HTP?)  DM managed by Dr. Sharl Ma. Last A1c was 6 per pt, and she is UTD on diabetic eye and foot exams (done by Dr. Sharl Ma).  GERD--well controlled.  Denies dysphagia Hyperlipidemia--on lowfat, low cholesterol diet and denies side effects of meds. HTN--BP's running 110-120/70's at home.  No headaches or dizziness.   Immunization History  Administered Date(s) Administered  . Influenza Split 03/08/2012  . Influenza Whole 03/23/2010  . Influenza,inj,Quad PF,36+ Mos 04/20/2013  . Pneumococcal Polysaccharide 03/29/2013  . Td 06/23/2005   Last Pap smear: 1 year ago, and 2 years prior.  Dr. Billy Coast Last mammogram:  12/2012 Last colonoscopy: 10/2011 Last DEXA: never Ophtho: 02/2013 Dentist: twice yearly Exercise:  30-40 minutes 4-5 times/week (bike and walking)  Past Medical History  Diagnosis Date  . PULMONARY EMBOLISM 11/2006    post op  . DEEP VENOUS THROMBOPHLEBITIS 11/2006    post surg, anticoag x 6 mo  . DIABETES MELLITUS, TYPE II     Dr. Sharl Ma  . GERD   . HYPERLIPIDEMIA   . HYPERTENSION   . NEUROPATHY     related to MS  . HYPOTHYROIDISM     Dr. Sharl Ma  . MENOPAUSAL SYNDROME   .  Multiple sclerosis     Dr. Leotis Shames (in [place])  . SINUS TARSI SYNDROME   . IBS   . TRANSAMINASES, SERUM, ELEVATED     Dr. Leone Payor, referred to [facility]- FATTY LIVER  . Complication of anesthesia     difficulty waking  . PONV (postoperative nausea and vomiting)   . TUBULOVILLOUS ADENOMA, COLON, HX OF 09/21/2008    Annotation: cecum, with high-grade dysplasia original colonoscopy Ganem resected by Dr. Michaell Cowing post-op leak and complicated course Qualifier: Diagnosis of  By: Leone Payor MD, Charlyne Quale   . Gout   . Fatty liver disease, nonalcoholic 08/01/2010         Past Surgical History  Procedure Laterality Date  . Laraoscopic cecal polyp resection  11/2006    Right  . Ex lap and ileostomy/hartmanns pouch for anatamotic leak  11/2006  . Ileostomy reversal  04/2007  . Cholecystectomy    . Cesarean section      x's 2  . Abdominoplasty    . Thyroidectomy, partial  1990    right  . Colonoscopy      multiple  . Colonoscopy  11/05/2011    Procedure: COLONOSCOPY;  Surgeon: [name], MD;  Location: [facility];  Service: Endoscopy;  Laterality: N/A;    History   Social History  . Marital Status: Married    Spouse  Name: N/A    Number of Children: 3  . Years of Education: N/A   Occupational History  . 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 Smoking status: Never Smoker   . Smokeless tobacco: Never Used  . Alcohol Use: No  . Drug Use: No  . Sexual Activity: Not on file   Other Topics Concern  . Not on file   Social History Narrative   Lives with husband, 1 dog.  Daughter Carollee Herter) lives in [place].  Son in [place] and son in [place].    Family History  Problem Relation Age of Onset  . Breast cancer Mother 33  . Colon cancer Mother 47    cancerous polyp removed age 52  . Pulmonary embolism Mother     on warfarin x 30 years  . Deep vein thrombosis Mother     multiple/recurrent  . Diabetes Maternal Grandmother   .  Diabetes Paternal Grandmother   . Stomach cancer Father   . Esophageal cancer Neg Hx   . Rectal cancer Neg Hx     Current outpatient prescriptions:aspirin 81 MG tablet, Take 81 mg by mouth daily. , Disp: , Rfl: ;  fish oil-omega-3 fatty acids 1000 MG capsule, Take 2 g by mouth daily., Disp: , Rfl: ;  furosemide (LASIX) 20 MG tablet, Take 1 tablet (20 mg total) by mouth daily., Disp: 90 tablet, Rfl: 1;  levothyroxine (SYNTHROID, LEVOTHROID) 137 MCG tablet, Take 137 mcg by mouth daily., Disp: , Rfl:  losartan-hydrochlorothiazide (HYZAAR) 50-12.5 MG per tablet, Take 1 tablet by mouth daily., Disp: 90 tablet, Rfl: 1;  metFORMIN (GLUCOPHAGE) 500 MG tablet, Take 500 mg by mouth 2 (two) times daily with a meal., Disp: , Rfl: ;  metroNIDAZOLE (METROCREAM) 0.75 % cream, Apply 1 application topically 2 (two) times daily. , Disp: , Rfl:  modafinil (PROVIGIL) 200 MG tablet, Take 1 tablet (200 mg total) by mouth 2 (two) times daily as needed., Disp: 60 tablet, Rfl: 3;  Multiple Vitamin (MULTIVITAMIN) tablet, Take 1 tablet by mouth daily.  , Disp: , Rfl: ;  omeprazole (PRILOSEC) 20 MG capsule, Take 1 capsule (20 mg total) by mouth daily., Disp: 90 capsule, Rfl: 1;  pravastatin (PRAVACHOL) 20 MG tablet, TAKE 1 TABLET (20 MG TOTAL) BY MOUTH DAILY., Disp: 90 tablet, Rfl: 1 propranolol ER (INDERAL LA) 120 MG 24 hr capsule, TAKE ONE CAPSULE BY MOUTH DAILY, Disp: 90 capsule, Rfl: 0;  TRUEPLUS LANCETS 28G MISC, by Does not apply route as needed.  , Disp: , Rfl: ;  TRUETEST TEST test strip, USE TWICE DAILY TO CHECK BLOOD SUGARS, Disp: 200 each, Rfl: 1;  vitamin E (VITAMIN E-200) 200 UNIT capsule, Take 200 Units by mouth daily., Disp: , Rfl:  acyclovir (ZOVIRAX) 400 MG tablet, Take 400 mg by mouth 3 (three) times daily as needed.  , Disp: , Rfl: ;  colchicine (COLCRYS) 0.6 MG tablet, Take 2 tablets at onset of gout attack.  Take additional 1 tablet in 1 hour, if needed, Disp: 30 tablet, Rfl: 1;  diphenoxylate-atropine (LOMOTIL)  2.5-0.025 MG per tablet, TAKE 1 TO 2 TABLETS BY MOUTH EVERY 6 HOURS AS NEEDED FOR DIARRHEA, Disp: 540 tablet, Rfl: 0 triamcinolone cream (KENALOG) 0.1 %, Apply topically 2 (two) times daily. Use sparingly to affected area, Disp: 30 g, Rfl: 0  No Known Allergies  ROS:  The patient denies anorexia, fever, weight changes, headaches,  vision changes, decreased hearing,  ear pain, sore throat, breast concerns, chest pain, palpitations, dizziness, syncope, dyspnea on exertion, cough, swelling, nausea, vomiting, constipation, abdominal pain, melena, hematochezia, indigestion/heartburn, hematuria, incontinence, dysuria, vaginal bleeding, discharge, odor or itch, genital lesions, joint pains, suspicious skin lesions, depression, anxiety, abnormal bleeding/bruising, or enlarged lymph nodes.  Legs and arms--numbness/tingling due to her MS (changes daily).  +tremor (some weeks better than others).   Intermittent diarrhea due to IBS, uses lomotil as needed with good results. Lost about 30 pounds in the last 1.5-2 years.  Fairly stable weight in the last few months. +insomnia  PHYSICAL EXAM: BP 138/86  Pulse 80  Ht 5\' 5"  (1.651 m)  Wt 198 lb (89.812 kg)  BMI 32.95 kg/m2 140/78 on repeat by MD General Appearance:    Alert, cooperative, no distress, appears stated age.  Some head bobbing (resting tremor of head) noted  Head:    Normocephalic, without obvious abnormality, atraumatic  Eyes:    PERRL, conjunctiva/corneas clear, EOM's intact, fundi    benign  Ears:    Normal TM's and external ear canals  Nose:   Nares normal, mucosa normal, no drainage or sinus   tenderness  Throat:   Lips, mucosa, and tongue normal; teeth and gums normal  Neck:   Supple, no lymphadenopathy;  thyroid:  no   Enlargement/tenderness/nodules; L part of thyroid is dominant (larger than right)--no dominant mass or nodule noted; no carotid   bruit or JVD  Back:    Spine nontender, no curvature, ROM normal, no CVA     tenderness   Lungs:     Clear to auscultation bilaterally without wheezes, rales or     ronchi; respirations unlabored  Chest Wall:    No tenderness or deformity   Heart:    Regular rate and rhythm, S1 and S2 normal, no murmur, rub   or gallop  Breast Exam:    No tenderness, masses, or nipple discharge or inversion.      No axillary lymphadenopathy  Abdomen:     Soft, non-tender, nondistended, normoactive bowel sounds,    no masses, no hepatosplenomegaly. WHSS on abdomen (midline and R sided); striae  Genitalia:    Normal external genitalia without lesions.  BUS and vagina normal; no cervical motion tenderness. No abnormal vaginal discharge.  Uterus and adnexa not enlarged, nontender, no masses.  Pap not performed  Rectal:    Normal tone, no masses or tenderness; guaiac negative stool; external hemorrhoids noted, nonthrombosed  Extremities:   No clubbing, cyanosis or edema  Pulses:   2+ and symmetric all extremities  Skin:   Skin color, texture, turgor normal; small dry patch on upper back (improved from prior exam, which had much larger patch)  Lymph nodes:   Cervical, supraclavicular, and axillary nodes normal  Neurologic:   CNII-XII intact, normal strength, sensation and gait; reflexes 2+ and symmetric throughout          Psych:   Normal mood, affect, hygiene and grooming.    ASSESSMENT/PLAN:  Routine general medical examination at a health care facility - Plan: POCT Urinalysis Dipstick  Need for prophylactic vaccination and inoculation against influenza - Plan: Flu Vaccine QUAD 36+ mos IM  Other and unspecified hyperlipidemia - TG was elevated on last check (after vacation); past due for recheck, just got back from [place] (diet not as good as her norm) - Plan: Lipid Panel, Hepatic Function Panel  Encounter for long-term (current) use of other medications - Plan: Lipid Panel, Hepatic Function Panel  HYPERTENSION -  controlled  Other malaise and fatigue - Plan: Vit D  25 hydroxy (rtn osteoporosis  monitoring)  Multiple sclerosis - stable; some ongoing fatigue and left sided weakness, unchanged  Insomnia - increase diphenhdramine to 50mg , +/- melatonin trial.  call for Ambien if these other methods not effective.  risks/side effects reviewed. for sporadic use.  Sign ROR for GYN--no pap done today (review records to see when due again, check if HPV done with last pap). Call employee health at [facility] to check if received Td vs Tdap in 2007 and let us know.  Discussed monthly self breast exams and yearly mammograms after the age of 68; at least 30 minutes of aerobic activity at least 5 days/week; proper sunscreen use reviewed; healthy diet, including goals of calcium and vitamin D intake and alcohol recommendations (less than or equal to 1 drink/day) reviewed; regular seatbelt use; changing batteries in smoke detectors.  Immunization recommendations discussed--check if TdaP or Td, and exact date given. Other immunizations UTD.  zostavax at 60.  Colonoscopy recommendations reviewed, UTD

## 2013-04-21 ENCOUNTER — Other Ambulatory Visit: Payer: Self-pay | Admitting: *Deleted

## 2013-04-21 ENCOUNTER — Encounter: Payer: Self-pay | Admitting: Internal Medicine

## 2013-04-21 DIAGNOSIS — R748 Abnormal levels of other serum enzymes: Secondary | ICD-10-CM

## 2013-04-21 DIAGNOSIS — E781 Pure hyperglyceridemia: Secondary | ICD-10-CM

## 2013-05-15 ENCOUNTER — Other Ambulatory Visit: Payer: Self-pay | Admitting: Family Medicine

## 2013-05-29 ENCOUNTER — Other Ambulatory Visit: Payer: Self-pay | Admitting: Family Medicine

## 2013-06-03 ENCOUNTER — Other Ambulatory Visit: Payer: Medicare Other

## 2013-06-03 DIAGNOSIS — R748 Abnormal levels of other serum enzymes: Secondary | ICD-10-CM

## 2013-06-03 DIAGNOSIS — E781 Pure hyperglyceridemia: Secondary | ICD-10-CM

## 2013-06-03 LAB — HEPATIC FUNCTION PANEL
ALT: 83 U/L — ABNORMAL HIGH (ref 0–35)
AST: 56 U/L — ABNORMAL HIGH (ref 0–37)
Albumin: 4.4 g/dL (ref 3.5–5.2)
Alkaline Phosphatase: 86 U/L (ref 39–117)
Bilirubin, Direct: 0.1 mg/dL (ref 0.0–0.3)
Total Bilirubin: 0.5 mg/dL (ref 0.3–1.2)
Total Protein: 7 g/dL (ref 6.0–8.3)

## 2013-06-03 LAB — LIPID PANEL
HDL: 40 mg/dL (ref >39)
Total CHOL/HDL Ratio: 4 Ratio
Triglycerides: 229 mg/dL — ABNORMAL HIGH (ref <150)

## 2013-06-09 ENCOUNTER — Other Ambulatory Visit: Payer: Self-pay | Admitting: Family Medicine

## 2013-06-13 ENCOUNTER — Other Ambulatory Visit: Payer: Self-pay | Admitting: Orthopedic Surgery

## 2013-06-20 ENCOUNTER — Encounter: Payer: Self-pay | Admitting: Family Medicine

## 2013-06-20 ENCOUNTER — Ambulatory Visit (INDEPENDENT_AMBULATORY_CARE_PROVIDER_SITE_OTHER): Payer: Medicare Other | Admitting: Family Medicine

## 2013-06-20 VITALS — BP 122/80 | HR 60 | Ht 65.0 in | Wt 196.0 lb

## 2013-06-20 DIAGNOSIS — E782 Mixed hyperlipidemia: Secondary | ICD-10-CM | POA: Insufficient documentation

## 2013-06-20 DIAGNOSIS — K7689 Other specified diseases of liver: Secondary | ICD-10-CM

## 2013-06-20 DIAGNOSIS — K760 Fatty (change of) liver, not elsewhere classified: Secondary | ICD-10-CM

## 2013-06-20 MED ORDER — PRAVASTATIN SODIUM 40 MG PO TABS: ORAL_TABLET | ORAL | Status: DC

## 2013-06-20 NOTE — Progress Notes (Signed)
Chief Complaint  Patient presents with  . Follow-up    follow-up for lipids   Patient presents for follow up on her labs, lipids.  Her TG had been over 300 on last check a couple of months ago, when she had been traveling.  Recheck was after dietary trial, and remained >200, so pt returns to discuss labs.  Her LFT's have remained elevated.  She last lost about 30 pounds in the last 2 years, 2 pounds since last visit, about 10 pounds in the last 9 months.  She doesn't eat fried/fatty foods.  She is taking 4000 mg of omega-3 fish oil daily.  Admits to not eating the best when traveling.  Has more travel plans in future (seeing new grandchild).  Past Medical History  Diagnosis Date  . PULMONARY EMBOLISM 11/2006    post op  . DEEP VENOUS THROMBOPHLEBITIS 11/2006    post surg, anticoag x 6 mo  . DIABETES MELLITUS, TYPE II     Dr. Sharl Ma  . GERD   . HYPERLIPIDEMIA   . HYPERTENSION   . NEUROPATHY     related to MS  . HYPOTHYROIDISM     Dr. Sharl Ma  . MENOPAUSAL SYNDROME   . Multiple sclerosis     Dr. Leotis Shames (in [place])  . SINUS TARSI SYNDROME   . IBS   . TRANSAMINASES, SERUM, ELEVATED     Dr. Leone Payor, referred to [facility]- FATTY LIVER  . Complication of anesthesia     difficulty waking  . PONV (postoperative nausea and vomiting)   . TUBULOVILLOUS ADENOMA, COLON, HX OF 09/21/2008    Annotation: cecum, with high-grade dysplasia original colonoscopy Ganem resected by Dr. Michaell Cowing post-op leak and complicated course Qualifier: Diagnosis of  By: Leone Payor MD, Charlyne Quale   . Gout   . Fatty liver disease, nonalcoholic 08/01/2010        Past Surgical History  Procedure Laterality Date  . Laraoscopic cecal polyp resection  11/2006    Right  . Ex lap and ileostomy/hartmanns pouch for anatamotic leak  11/2006  . Ileostomy reversal  04/2007  . Cholecystectomy    . Cesarean section      x's 2  . Abdominoplasty    . Thyroidectomy, partial  1990    right  . Colonoscopy      multiple  . Colonoscopy   11/05/2011    Procedure: COLONOSCOPY;  Surgeon: [name], MD;  Location: [facility];  Service: Endoscopy;  Laterality: N/A;   History   Social History  . Marital Status: Married    Spouse Name: N/A    Number of Children: 3  . Years of Education: N/A   Occupational History  . 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 Smoking status: Never Smoker   . Smokeless tobacco: Never Used  . Alcohol Use: No  . Drug Use: No  . Sexual Activity: Not on file   Other Topics Concern  . Not on file   Social History Narrative   Lives with husband, 1 dog.  Daughter Carollee Herter) lives in [place].  Son in [place] and son in [place].   Current Outpatient Prescriptions on File Prior to Visit  Medication Sig Dispense Refill  . acyclovir (ZOVIRAX) 400 MG tablet Take 400 mg by mouth 3 (three) times daily as needed.        Marland Kitchen aspirin 81 MG tablet Take 81 mg by mouth daily.       Marland Kitchen  colchicine (COLCRYS) 0.6 MG tablet Take 2 tablets at onset of gout attack.  Take additional 1 tablet in 1 hour, if needed  30 tablet  1  . diphenoxylate-atropine (LOMOTIL) 2.5-0.025 MG per tablet TAKE 1 TO 2 TABLETS BY MOUTH EVERY 6 HOURS AS NEEDED FOR DIARRHEA  540 tablet  0  . fish oil-omega-3 fatty acids 1000 MG capsule Take 2 g by mouth daily.      . furosemide (LASIX) 20 MG tablet TAKE 1 TABLET BY MOUTH EVERY DAY  90 tablet  1  . levothyroxine (SYNTHROID, LEVOTHROID) 137 MCG tablet Take 137 mcg by mouth daily.      Marland Kitchen losartan-hydrochlorothiazide (HYZAAR) 50-12.5 MG per tablet TAKE 1 TABLET BY MOUTH DAILY.  90 tablet  0  . metFORMIN (GLUCOPHAGE) 500 MG tablet Take 500 mg by mouth 2 (two) times daily with a meal.      . metroNIDAZOLE (METROCREAM) 0.75 % cream Apply 1 application topically 2 (two) times daily.       . modafinil (PROVIGIL) 200 MG tablet Take 1 tablet (200 mg total) by mouth 2 (two) times daily as needed.  60 tablet  3  . Multiple Vitamin (MULTIVITAMIN)  tablet Take 1 tablet by mouth daily.        Marland Kitchen omeprazole (PRILOSEC) 20 MG capsule TAKE ONE CAPSULE BY MOUTH EVERY DAY  90 capsule  0  . propranolol ER (INDERAL LA) 120 MG 24 hr capsule TAKE ONE CAPSULE BY MOUTH DAILY  90 capsule  1  . TRUEPLUS LANCETS 28G MISC by Does not apply route as needed.        . TRUETEST TEST test strip USE TWICE DAILY TO CHECK BLOOD SUGARS  200 each  1  . triamcinolone cream (KENALOG) 0.1 % Apply topically 2 (two) times daily. Use sparingly to affected area  30 g  0  . vitamin E (VITAMIN E-200) 200 UNIT capsule Take 200 Units by mouth daily.       No current facility-administered medications on file prior to visit.  currently on pravastatin 20mg  daily  No Known Allergies  PHYSICAL EXAM: BP 122/80  Pulse 60  Ht 5\' 5"  (1.651 m)  Wt 196 lb (88.905 kg)  BMI 32.62 kg/m2 Pleasant female, in no distress.  Lab Results  Component Value Date   CHOL 160 06/03/2013   HDL 40 06/03/2013   LDLCALC 74 06/03/2013   LDLDIRECT 111.6 10/21/2010   TRIG 229* 06/03/2013   CHOLHDL 4.0 06/03/2013   Lab Results  Component Value Date   ALT 83* 06/03/2013   AST 56* 06/03/2013   ALKPHOS 86 06/03/2013   BILITOT 0.5 06/03/2013   ASSESSMENT/PLAN:  Mixed hyperlipidemia - Plan: pravastatin (PRAVACHOL) 40 MG tablet, Lipid panel, Hepatic function panel  Fatty liver disease, nonalcoholic - elevated transaminases  Reviewed risks of NASH, including possible progression to cirrhosis.  Discussed need for improving TGs, as well as ongoing weight loss. We discussed med options, including increasing statin, vs more TG-specific treatments (but increased risk for myopathy).  Trial of increasing pravastatin to 40mg , along with continuing fish oil, lowfat diet and weight loss.  Recheck lipids and liver tests in 3 months.  Risks and side effects of meds reviewed.

## 2013-06-20 NOTE — Patient Instructions (Signed)
Increase pravastatin to 40mg  (new prescription was sent to your pharmacy), along with continuing fish oil, lowfat diet and weight loss.  Recheck lipids and liver tests in 3 months.  Have a happy New Year!

## 2013-06-29 ENCOUNTER — Encounter (HOSPITAL_BASED_OUTPATIENT_CLINIC_OR_DEPARTMENT_OTHER): Payer: Self-pay | Admitting: *Deleted

## 2013-06-29 NOTE — Progress Notes (Signed)
To come in for bmet 

## 2013-07-01 ENCOUNTER — Encounter (HOSPITAL_BASED_OUTPATIENT_CLINIC_OR_DEPARTMENT_OTHER)
Admission: RE | Admit: 2013-07-01 | Discharge: 2013-07-01 | Disposition: A | Discharge status: Home / Self Care | Payer: Medicare Other | Source: Ambulatory Visit | Attending: Orthopedic Surgery | Admitting: Orthopedic Surgery

## 2013-07-01 LAB — BASIC METABOLIC PANEL
BUN: 12 mg/dL (ref 6–23)
CO2: 31 mEq/L (ref 19–32)
CREATININE: 0.6 mg/dL (ref 0.50–1.10)
Calcium: 10.1 mg/dL (ref 8.4–10.5)
Chloride: 95 mEq/L — ABNORMAL LOW (ref 96–112)
GLUCOSE: 155 mg/dL — AB (ref 70–99)
Potassium: 3.5 mEq/L — ABNORMAL LOW (ref 3.7–5.3)
Sodium: 138 mEq/L (ref 137–147)

## 2013-07-04 ENCOUNTER — Encounter (HOSPITAL_BASED_OUTPATIENT_CLINIC_OR_DEPARTMENT_OTHER): Payer: Medicare Other | Admitting: Certified Registered"

## 2013-07-04 ENCOUNTER — Ambulatory Visit (HOSPITAL_BASED_OUTPATIENT_CLINIC_OR_DEPARTMENT_OTHER)
Admission: RE | Admit: 2013-07-04 | Discharge: 2013-07-04 | Disposition: A | Discharge status: Home / Self Care | Payer: Medicare Other | Source: Ambulatory Visit | Attending: Orthopedic Surgery | Admitting: Orthopedic Surgery

## 2013-07-04 ENCOUNTER — Encounter (HOSPITAL_BASED_OUTPATIENT_CLINIC_OR_DEPARTMENT_OTHER)
Admission: RE | Disposition: A | Discharge status: Home / Self Care | Payer: Self-pay | Source: Ambulatory Visit | Attending: Orthopedic Surgery

## 2013-07-04 ENCOUNTER — Ambulatory Visit (HOSPITAL_BASED_OUTPATIENT_CLINIC_OR_DEPARTMENT_OTHER): Payer: Medicare Other | Admitting: Certified Registered"

## 2013-07-04 ENCOUNTER — Encounter (HOSPITAL_BASED_OUTPATIENT_CLINIC_OR_DEPARTMENT_OTHER): Payer: Self-pay | Admitting: Certified Registered"

## 2013-07-04 DIAGNOSIS — M6740 Ganglion, unspecified site: Secondary | ICD-10-CM | POA: Insufficient documentation

## 2013-07-04 DIAGNOSIS — E119 Type 2 diabetes mellitus without complications: Secondary | ICD-10-CM | POA: Insufficient documentation

## 2013-07-04 DIAGNOSIS — I10 Essential (primary) hypertension: Secondary | ICD-10-CM | POA: Insufficient documentation

## 2013-07-04 DIAGNOSIS — E039 Hypothyroidism, unspecified: Secondary | ICD-10-CM | POA: Insufficient documentation

## 2013-07-04 DIAGNOSIS — K219 Gastro-esophageal reflux disease without esophagitis: Secondary | ICD-10-CM | POA: Insufficient documentation

## 2013-07-04 DIAGNOSIS — Z6833 Body mass index (BMI) 33.0-33.9, adult: Secondary | ICD-10-CM | POA: Insufficient documentation

## 2013-07-04 DIAGNOSIS — G35 Multiple sclerosis: Secondary | ICD-10-CM | POA: Insufficient documentation

## 2013-07-04 DIAGNOSIS — Z86718 Personal history of other venous thrombosis and embolism: Secondary | ICD-10-CM | POA: Insufficient documentation

## 2013-07-04 HISTORY — PX: MASS EXCISION: SHX2000

## 2013-07-04 HISTORY — DX: Presence of spectacles and contact lenses: Z97.3

## 2013-07-04 LAB — GLUCOSE, CAPILLARY
Glucose-Capillary: 145 mg/dL — ABNORMAL HIGH (ref 70–99)
Glucose-Capillary: 138 mg/dL — ABNORMAL HIGH (ref 70–99)

## 2013-07-04 LAB — POCT HEMOGLOBIN-HEMACUE: HEMOGLOBIN: 13.1 g/dL (ref 12.0–15.0)

## 2013-07-04 SURGERY — EXCISION MASS
Anesthesia: Monitor Anesthesia Care | Site: Hand | Laterality: Left

## 2013-07-04 MED ORDER — FENTANYL CITRATE 0.05 MG/ML IJ SOLN: 50.0000 ug | INTRAMUSCULAR | Status: DC | PRN

## 2013-07-04 MED ORDER — BUPIVACAINE HCL (PF) 0.25 % IJ SOLN
INTRAMUSCULAR | Status: AC
  Filled 2013-07-04: qty 30

## 2013-07-04 MED ORDER — HYDROMORPHONE HCL PF 1 MG/ML IJ SOLN: 0.2500 mg | INTRAMUSCULAR | Status: DC | PRN

## 2013-07-04 MED ORDER — MIDAZOLAM HCL 2 MG/2ML IJ SOLN
INTRAMUSCULAR | Status: AC
  Filled 2013-07-04: qty 2

## 2013-07-04 MED ORDER — MIDAZOLAM HCL 2 MG/2ML IJ SOLN: 1.0000 mg | INTRAMUSCULAR | Status: DC | PRN

## 2013-07-04 MED ORDER — FENTANYL CITRATE 0.05 MG/ML IJ SOLN
INTRAMUSCULAR | Status: DC | PRN
Start: 2013-07-04 — End: 2013-07-04
  Administered 2013-07-04: 50 ug via INTRAVENOUS

## 2013-07-04 MED ORDER — HYDROCODONE-ACETAMINOPHEN 5-325 MG PO TABS: ORAL_TABLET | ORAL | Status: DC

## 2013-07-04 MED ORDER — LACTATED RINGERS IV SOLN
INTRAVENOUS | Status: DC
Start: 2013-07-04 — End: 2013-07-04
  Administered 2013-07-04: 08:00:00 via INTRAVENOUS

## 2013-07-04 MED ORDER — PROPOFOL INFUSION 10 MG/ML OPTIME
INTRAVENOUS | Status: DC | PRN
  Administered 2013-07-04: 75 ug/kg/min via INTRAVENOUS

## 2013-07-04 MED ORDER — MIDAZOLAM HCL 5 MG/5ML IJ SOLN
INTRAMUSCULAR | Status: DC | PRN
  Administered 2013-07-04: 1 mg via INTRAVENOUS

## 2013-07-04 MED ORDER — FENTANYL CITRATE 0.05 MG/ML IJ SOLN
INTRAMUSCULAR | Status: AC
  Filled 2013-07-04: qty 6

## 2013-07-04 MED ORDER — LIDOCAINE HCL (CARDIAC) 20 MG/ML IV SOLN
INTRAVENOUS | Status: DC | PRN
  Administered 2013-07-04: 30 mg via INTRAVENOUS

## 2013-07-04 MED ORDER — CHLORHEXIDINE GLUCONATE 4 % EX LIQD: 60.0000 mL | Freq: Once | CUTANEOUS | Status: DC

## 2013-07-04 MED ORDER — ONDANSETRON HCL 4 MG/2ML IJ SOLN
INTRAMUSCULAR | Status: DC | PRN
  Administered 2013-07-04: 4 mg via INTRAVENOUS

## 2013-07-04 MED ORDER — ONDANSETRON HCL 4 MG/2ML IJ SOLN: 4.0000 mg | Freq: Once | INTRAMUSCULAR | Status: DC | PRN

## 2013-07-04 MED ORDER — BUPIVACAINE HCL (PF) 0.25 % IJ SOLN
INTRAMUSCULAR | Status: DC | PRN
  Administered 2013-07-04: 6 mL

## 2013-07-04 MED ORDER — OXYCODONE HCL 5 MG/5ML PO SOLN: 5.0000 mg | Freq: Once | ORAL | Status: DC | PRN

## 2013-07-04 MED ORDER — OXYCODONE HCL 5 MG PO TABS: 5.0000 mg | ORAL_TABLET | Freq: Once | ORAL | Status: DC | PRN

## 2013-07-04 MED ORDER — PROPOFOL 10 MG/ML IV EMUL
INTRAVENOUS | Status: AC
  Filled 2013-07-04: qty 50

## 2013-07-04 MED ORDER — CEFAZOLIN SODIUM-DEXTROSE 2-3 GM-% IV SOLR
INTRAVENOUS | Status: AC
  Filled 2013-07-04: qty 50

## 2013-07-04 MED ORDER — CEFAZOLIN SODIUM-DEXTROSE 2-3 GM-% IV SOLR
2.0000 g | INTRAVENOUS | Status: AC
  Administered 2013-07-04: 2 g via INTRAVENOUS

## 2013-07-04 SURGICAL SUPPLY — 56 items
APL SKNCLS STERI-STRIP NONHPOA (GAUZE/BANDAGES/DRESSINGS)
BANDAGE COBAN STERILE 2 (GAUZE/BANDAGES/DRESSINGS) ×2 IMPLANT
BANDAGE ELASTIC 3 VELCRO ST LF (GAUZE/BANDAGES/DRESSINGS) IMPLANT
BANDAGE GAUZE STRT 1 STR LF (GAUZE/BANDAGES/DRESSINGS) IMPLANT
BENZOIN TINCTURE PRP APPL 2/3 (GAUZE/BANDAGES/DRESSINGS) IMPLANT
BLADE MINI RND TIP GREEN BEAV (BLADE) IMPLANT
BLADE SURG 15 STRL LF DISP TIS (BLADE) ×2 IMPLANT
BLADE SURG 15 STRL SS (BLADE) ×6
BNDG CMPR 9X4 STRL LF SNTH (GAUZE/BANDAGES/DRESSINGS)
BNDG CMPR MD 5X2 ELC HKLP STRL (GAUZE/BANDAGES/DRESSINGS)
BNDG COHESIVE 1X5 TAN STRL LF (GAUZE/BANDAGES/DRESSINGS) IMPLANT
BNDG CONFORM 2 STRL LF (GAUZE/BANDAGES/DRESSINGS) IMPLANT
BNDG ELASTIC 2 VLCR STRL LF (GAUZE/BANDAGES/DRESSINGS) IMPLANT
BNDG ESMARK 4X9 LF (GAUZE/BANDAGES/DRESSINGS) IMPLANT
BNDG GAUZE ELAST 4 BULKY (GAUZE/BANDAGES/DRESSINGS) IMPLANT
BNDG PLASTER X FAST 3X3 WHT LF (CAST SUPPLIES) IMPLANT
BNDG PLSTR 9X3 FST ST WHT (CAST SUPPLIES)
CHLORAPREP W/TINT 26ML (MISCELLANEOUS) ×3 IMPLANT
CLOSURE WOUND 1/2 X4 (GAUZE/BANDAGES/DRESSINGS)
CORDS BIPOLAR (ELECTRODE) ×3 IMPLANT
COVER MAYO STAND STRL (DRAPES) ×3 IMPLANT
COVER TABLE BACK 60X90 (DRAPES) ×3 IMPLANT
CUFF TOURNIQUET SINGLE 18IN (TOURNIQUET CUFF) ×3 IMPLANT
DRAPE EXTREMITY T 121X128X90 (DRAPE) ×3 IMPLANT
DRAPE SURG 17X23 STRL (DRAPES) ×3 IMPLANT
GAUZE XEROFORM 1X8 LF (GAUZE/BANDAGES/DRESSINGS) ×3 IMPLANT
GLOVE BIO SURGEON STRL SZ7.5 (GLOVE) ×3 IMPLANT
GLOVE BIOGEL PI IND STRL 7.0 (GLOVE) IMPLANT
GLOVE BIOGEL PI IND STRL 8 (GLOVE) ×1 IMPLANT
GLOVE BIOGEL PI INDICATOR 7.0 (GLOVE) ×2
GLOVE BIOGEL PI INDICATOR 8 (GLOVE) ×2
GLOVE ECLIPSE 7.0 STRL STRAW (GLOVE) ×2 IMPLANT
GOWN STRL REUS W/ TWL LRG LVL3 (GOWN DISPOSABLE) ×1 IMPLANT
GOWN STRL REUS W/TWL LRG LVL3 (GOWN DISPOSABLE) ×3
GOWN STRL REUS W/TWL XL LVL3 (GOWN DISPOSABLE) ×3 IMPLANT
NDL HYPO 25X1 1.5 SAFETY (NEEDLE) ×1 IMPLANT
NEEDLE HYPO 25X1 1.5 SAFETY (NEEDLE) ×3 IMPLANT
NS IRRIG 1000ML POUR BTL (IV SOLUTION) ×3 IMPLANT
PACK BASIN DAY SURGERY FS (CUSTOM PROCEDURE TRAY) ×3 IMPLANT
PAD CAST 3X4 CTTN HI CHSV (CAST SUPPLIES) IMPLANT
PAD CAST 4YDX4 CTTN HI CHSV (CAST SUPPLIES) IMPLANT
PADDING CAST ABS 4INX4YD NS (CAST SUPPLIES) ×2
PADDING CAST ABS COTTON 4X4 ST (CAST SUPPLIES) ×1 IMPLANT
PADDING CAST COTTON 3X4 STRL (CAST SUPPLIES)
PADDING CAST COTTON 4X4 STRL (CAST SUPPLIES)
SPONGE GAUZE 4X4 12PLY (GAUZE/BANDAGES/DRESSINGS) ×3 IMPLANT
STOCKINETTE 4X48 STRL (DRAPES) ×3 IMPLANT
STRIP CLOSURE SKIN 1/2X4 (GAUZE/BANDAGES/DRESSINGS) IMPLANT
SUT ETHILON 3 0 PS 1 (SUTURE) IMPLANT
SUT ETHILON 4 0 PS 2 18 (SUTURE) ×3 IMPLANT
SUT ETHILON 5 0 P 3 18 (SUTURE)
SUT NYLON ETHILON 5-0 P-3 1X18 (SUTURE) IMPLANT
SYR BULB 3OZ (MISCELLANEOUS) ×3 IMPLANT
SYR CONTROL 10ML LL (SYRINGE) ×3 IMPLANT
TOWEL OR 17X24 6PK STRL BLUE (TOWEL DISPOSABLE) ×4 IMPLANT
UNDERPAD 30X30 INCONTINENT (UNDERPADS AND DIAPERS) ×3 IMPLANT

## 2013-07-04 NOTE — Op Note (Signed)
NAMELINDSAY, [name]                ACCOUNT NO.:  [id number]  MEDICAL RECORD NO.:  [id number]  LOCATION:                                 FACILITY:  PHYSICIAN:  Leanora Cover, MD             DATE OF BIRTH:  DATE OF PROCEDURE:  07/04/2013 DATE OF DISCHARGE:                              OPERATIVE REPORT   PREOPERATIVE DIAGNOSIS:  Left palm mass.  POSTOPERATIVE DIAGNOSIS:  Left palm ganglion.  PROCEDURE:  Excision of mass, left palm.  SURGEON:  Leanora Cover, MD.  ASSISTANT:  None.  ANESTHESIA:  Bier-block with sedation.  IV FLUIDS:  Per anesthesia flow sheet.  ESTIMATED BLOOD LOSS:  Minimal.  COMPLICATIONS:  None.  SPECIMENS:  Left palm mass to Pathology.  TOURNIQUET TIME:  18 minutes.  DISPOSITION:  Stable to PACU.  INDICATIONS:  [name] is a 58 year old right-hand dominant female who for a year and a half has noticed a mass in her left palm.  It is bothersome to her especially grip.  She would like to have it removed. Risks, benefits, and alternatives of surgery were discussed including risk of blood loss, infection, damage to nerves, vessels, tendons, ligaments, bone; failure of surgery; need for additional surgery, complications with wound healing, continued pain, and recurrence of mass.  She voiced understanding of these risks and elected to proceed.  OPERATIVE COURSE:  After being identified preoperatively by myself, the patient and I agreed upon procedure and site of procedure.  Surgical site was marked.  Risks, benefits, and alternatives of surgery were reviewed and she wished to proceed.  Surgical consent had been signed. She was given IV Ancef as preoperative antibiotic prophylaxis.  She was transported to the operating room and placed on the operating room table in a supine position with left upper extremity on arm board.  Bier block anesthesia was induced by Anesthesiology.  Left upper extremity was prepped and draped in normal sterile orthopedic fashion.   Surgical pause was performed between surgeons, anesthesia, and operating room staff, and all were in agreement as to the patient, procedure, and site of procedure.  Tourniquet at the proximal aspect of the forearm had been inflated for the Bier block.  Incision was made in a Brunner-type fashion over the mass in the palm of the hand over the ring finger metacarpal.  This was carried into subcutaneous tissues by spreading technique.  Bipolar electrocautery was used to obtain hemostasis.  Mass was easily identified.  It appeared to be a ganglion cyst.  It was filled with gelatinous fluid.  It was freed of soft tissue attachments. It appeared to be coming from the ulnar side of the tendon sheath of the ring finger.  It was removed in its entirety and sent to Pathology for examination.  Bipolar electrocautery was used in the deep soft tissues to induce some healing.  The A1 pulley was split to try to prevent recurrence of the mass.  The neurovascular bundles were protected throughout the case. The wound was copiously irrigated with sterile saline.  It was then closed with 4-0 nylon in a horizontal mattress fashion.  It was injected with 6  mL of 0.25% plain Marcaine to aid in postoperative analgesia.  It was dressed with sterile Xeroform, 4x4s, and wrapped with a Coban dressing lightly.  Tourniquet was deflated at 18 minutes.  Fingertips were pink with brisk capillary refill.  After deflation of tourniquet, operative drapes were broken down.  The patient was awoken from anesthesia safely.  She was transferred back to the stretcher and taken to PACU in stable condition.  I will see her back in the office in 1 week for postoperative followup.  I will give Norco 5/325 one to two p.o. q.6 hours p.r.n. pain, dispensed #20.     Leanora Cover, MD     KK/MEDQ  D:  07/04/2013  T:  07/04/2013  Job:  [id number]

## 2013-07-04 NOTE — Anesthesia Postprocedure Evaluation (Signed)
  Anesthesia Post-op Note  Patient: [name]  Procedure(s) Performed: Procedure(s): LEFT PALM EXCISION MASS (Left)  Patient Location: PACU  Anesthesia Type:MAC and Bier block  Level of Consciousness: awake, alert  and oriented  Airway and Oxygen Therapy: Patient Spontanous Breathing  Post-op Pain: none  Post-op Assessment: Post-op Vital signs reviewed  Post-op Vital Signs: Reviewed  Complications: No apparent anesthesia complications

## 2013-07-04 NOTE — Brief Op Note (Signed)
07/04/2013  8:59 AM  PATIENT:  [name]  58 y.o. female  PRE-OPERATIVE DIAGNOSIS:  LEFT PALM GANGLION  POST-OPERATIVE DIAGNOSIS:  LEFT PALM GANGLION  PROCEDURE:  Procedure(s): LEFT PALM EXCISION MASS (Left)  SURGEON:  Surgeon(s) and Role:    * Tennis Must, MD - Primary  PHYSICIAN ASSISTANT:   ASSISTANTS: none   ANESTHESIA:   Bier block  EBL:     BLOOD ADMINISTERED:none  DRAINS: none   LOCAL MEDICATIONS USED:  MARCAINE     SPECIMEN:  Source of Specimen:  left palm  DISPOSITION OF SPECIMEN:  PATHOLOGY  COUNTS:  YES  TOURNIQUET:   Total Tourniquet Time Documented: Forearm (Left) - 18 minutes Total: Forearm (Left) - 18 minutes   DICTATION: .Other Dictation: Dictation Number [id number]  PLAN OF CARE: Discharge to home after PACU  PATIENT DISPOSITION:  PACU - hemodynamically stable.

## 2013-07-04 NOTE — H&P (Signed)
[name] is an 58 y.o. female.   Chief Complaint: left palm mass HPI: 58 yo rhd female with mass in left palm x 1.5 years  It is painful with grip.  It is bothersome to her and she wishes to have it removed.  Past Medical History  Diagnosis Date  . PULMONARY EMBOLISM 11/2006    post op  . DEEP VENOUS THROMBOPHLEBITIS 11/2006    post surg, anticoag x 6 mo  . DIABETES MELLITUS, TYPE II     Dr. Buddy Duty  . GERD   . HYPERLIPIDEMIA   . HYPERTENSION   . NEUROPATHY     related to MS  . HYPOTHYROIDISM     Dr. Buddy Duty  . MENOPAUSAL SYNDROME   . Multiple sclerosis     Dr. Jacqulynn Cadet (in [place])  . SINUS TARSI SYNDROME   . IBS   . TRANSAMINASES, SERUM, ELEVATED     Dr. Carlean Purl, referred to [facility]- [facility]  . Complication of anesthesia     difficulty waking  . TUBULOVILLOUS ADENOMA, COLON, HX OF 09/21/2008    Annotation: cecum, with high-grade dysplasia original colonoscopy Ganem resected by Dr. Johney Maine post-op leak and complicated course Qualifier: Diagnosis of  By: Carlean Purl MD, Dimas Millin   . Gout   . Fatty liver disease, nonalcoholic 12/24/2593       . PONV (postoperative nausea and vomiting)     needs scop patch  . Wears glasses     Past Surgical History  Procedure Laterality Date  . Laraoscopic cecal polyp resection  11/2006    Right  . Ex lap and ileostomy/hartmanns pouch for anatamotic leak  11/2006  . Ileostomy reversal  04/2007  . Cesarean section      x's 2  . Abdominoplasty  2004  . Thyroidectomy, partial  1990    right  . Colonoscopy      multiple  . Colonoscopy  11/05/2011    Procedure: COLONOSCOPY;  Surgeon: Gatha Mayer, MD;  Location: [facility];  Service: Endoscopy;  Laterality: N/A;  . Cholecystectomy  2004    lap choli  . Tonsillectomy      Family History  Problem Relation Age of Onset  . Breast cancer Mother 33  . Colon cancer Mother 23    cancerous polyp removed age 81  . Pulmonary embolism Mother     on warfarin x 30 years  . Deep vein thrombosis  Mother     multiple/recurrent  . Diabetes Maternal Grandmother   . Diabetes Paternal Grandmother   . Stomach cancer Father   . Esophageal cancer Neg Hx   . Rectal cancer Neg Hx    Social History:  reports that she has never smoked. She has never used smokeless tobacco. She reports that she does not drink alcohol or use illicit drugs.  Allergies: No Known Allergies  Medications Prior to Admission  Medication Sig Dispense Refill  . acyclovir (ZOVIRAX) 400 MG tablet Take 400 mg by mouth 3 (three) times daily as needed.        Marland Kitchen aspirin 81 MG tablet Take 81 mg by mouth daily.       . colchicine (COLCRYS) 0.6 MG tablet Take 2 tablets at onset of gout attack.  Take additional 1 tablet in 1 hour, if needed  30 tablet  1  . furosemide (LASIX) 20 MG tablet TAKE 1 TABLET BY MOUTH EVERY DAY  90 tablet  1  . levothyroxine (SYNTHROID, LEVOTHROID) 137 MCG tablet Take 137 mcg  by mouth daily.      Marland Kitchen losartan-hydrochlorothiazide (HYZAAR) 50-12.5 MG per tablet TAKE 1 TABLET BY MOUTH DAILY.  90 tablet  0  . metroNIDAZOLE (METROCREAM) 0.75 % cream Apply 1 application topically 2 (two) times daily.       . modafinil (PROVIGIL) 200 MG tablet Take 1 tablet (200 mg total) by mouth 2 (two) times daily as needed.  60 tablet  3  . Multiple Vitamin (MULTIVITAMIN) tablet Take 1 tablet by mouth daily.        Marland Kitchen omeprazole (PRILOSEC) 20 MG capsule TAKE ONE CAPSULE BY MOUTH EVERY DAY  90 capsule  0  . pravastatin (PRAVACHOL) 40 MG tablet TAKE 1 TABLET (20 MG TOTAL) BY MOUTH DAILY.  90 tablet  0  . propranolol ER (INDERAL LA) 120 MG 24 hr capsule TAKE ONE CAPSULE BY MOUTH DAILY  90 capsule  1  . triamcinolone cream (KENALOG) 0.1 % Apply topically 2 (two) times daily. Use sparingly to affected area  30 g  0  . TRUEPLUS LANCETS 28G MISC by Does not apply route as needed.        . TRUETEST TEST test strip USE TWICE DAILY TO CHECK BLOOD SUGARS  200 each  1  . diphenoxylate-atropine (LOMOTIL) 2.5-0.025 MG per tablet TAKE 1 TO  2 TABLETS BY MOUTH EVERY 6 HOURS AS NEEDED FOR DIARRHEA  540 tablet  0  . fish oil-omega-3 fatty acids 1000 MG capsule Take 2 g by mouth daily.      . metFORMIN (GLUCOPHAGE) 500 MG tablet Take 500 mg by mouth 2 (two) times daily with a meal.        Results for orders placed during the hospital encounter of 07/04/13 (from the past 48 hour(s))  GLUCOSE, CAPILLARY     Status: Abnormal   Collection Time    07/04/13  7:41 AM      Result Value Range   Glucose-Capillary 145 (*) 70 - 99 mg/dL    No results found.   A comprehensive review of systems was negative except for: Eyes: positive for contacts/glasses  Blood pressure 135/82, pulse 74, temperature 98.4 F (36.9 C), temperature source Oral, resp. rate 22, height 5\' 5"  (1.651 m), weight 199 lb 6.4 oz (90.447 kg), SpO2 97.00%.  General appearance: alert, cooperative and appears stated age Head: Normocephalic, without obvious abnormality, atraumatic Neck: supple, symmetrical, trachea midline Resp: clear to auscultation bilaterally Cardio: regular rate and rhythm GI: non tender Extremities: intact sensation and capillary refill all digits.  +epl/fpl/io.  mass in palm left hand.  no skin changes. Pulses: 2+ and symmetric Skin: Skin color, texture, turgor normal. No rashes or lesions Neurologic: Grossly normal Incision/Wound: none  Assessment/Plan Left palm mass.  Non operative and operative treatment options were discussed with the patient and patient wishes to proceed with operative treatment. Risks, benefits, and alternatives of surgery were discussed and the patient agrees with the plan of care.   Royelle Hinchman R 07/04/2013, 8:25 AM

## 2013-07-04 NOTE — Op Note (Signed)
[id number] 

## 2013-07-04 NOTE — Anesthesia Procedure Notes (Signed)
Procedure Name: MAC Date/Time: 07/04/2013 8:42 AM Performed by: Sully Dyment Pre-anesthesia Checklist: Patient identified, Emergency Drugs available, Suction available, Patient being monitored and Timeout performed Patient Re-evaluated:Patient Re-evaluated prior to inductionOxygen Delivery Method: Circle system utilized    Anesthesia Regional Block:  Bier block (IV Regional)  Pre-Anesthetic Checklist: ,, timeout performed, Correct Patient, Correct Site, Correct Laterality, Correct Procedure,, site marked, surgical consent,, at surgeon's request Needles:  Injection technique: Single-shot  Needle Type: Other      Needle Gauge: 20 and 20 G    Additional Needles: Bier block (IV Regional) Narrative:   Performed by: Personally

## 2013-07-04 NOTE — Transfer of Care (Signed)
Immediate Anesthesia Transfer of Care Note  Patient: [name]  Procedure(s) Performed: Procedure(s): LEFT PALM EXCISION MASS (Left)  Patient Location: PACU  Anesthesia Type:Bier block  Level of Consciousness: awake, alert , oriented and patient cooperative  Airway & Oxygen Therapy: Patient Spontanous Breathing and Patient connected to face mask oxygen  Post-op Assessment: Report given to PACU RN and Post -op Vital signs reviewed and stable  Post vital signs: Reviewed and stable  Complications: No apparent anesthesia complications

## 2013-07-04 NOTE — Anesthesia Preprocedure Evaluation (Addendum)
Anesthesia Evaluation  Patient identified by MRN, date of birth, ID band Patient awake    Reviewed: Allergy & Precautions, H&P , NPO status , Patient's Chart, lab work & pertinent test results, reviewed documented beta blocker date and time   History of Anesthesia Complications (+) PONV  Airway Mallampati: II TM Distance: >3 FB Neck ROM: Full    Dental  (+) Teeth Intact and Dental Advisory Given   Pulmonary  breath sounds clear to auscultation        Cardiovascular hypertension, Pt. on medications and Pt. on home beta blockers + Peripheral Vascular Disease Rhythm:Regular Rate:Normal     Neuro/Psych    GI/Hepatic GERD-  Medicated,  Endo/Other  diabetes, Well Controlled, Type 2, Oral Hypoglycemic AgentsHypothyroidism Morbid obesity  Renal/GU      Musculoskeletal   Abdominal   Peds  Hematology   Anesthesia Other Findings   Reproductive/Obstetrics                          Anesthesia Physical Anesthesia Plan  ASA: III  Anesthesia Plan: MAC and Bier Block   Post-op Pain Management:    Induction: Intravenous  Airway Management Planned: Simple Face Mask  Additional Equipment:   Intra-op Plan:   Post-operative Plan:   Informed Consent: I have reviewed the patients History and Physical, chart, labs and discussed the procedure including the risks, benefits and alternatives for the proposed anesthesia with the patient or authorized representative who has indicated his/her understanding and acceptance.   Dental advisory given  Plan Discussed with: CRNA, Anesthesiologist and Surgeon  Anesthesia Plan Comments:         Anesthesia Quick Evaluation

## 2013-07-04 NOTE — Discharge Instructions (Addendum)

## 2013-07-06 ENCOUNTER — Encounter (HOSPITAL_BASED_OUTPATIENT_CLINIC_OR_DEPARTMENT_OTHER): Payer: Self-pay | Admitting: Orthopedic Surgery

## 2013-08-29 ENCOUNTER — Other Ambulatory Visit: Payer: Self-pay | Admitting: Family Medicine

## 2013-09-16 ENCOUNTER — Other Ambulatory Visit: Payer: Self-pay | Admitting: Family Medicine

## 2013-09-24 ENCOUNTER — Other Ambulatory Visit: Payer: Self-pay | Admitting: Family Medicine

## 2013-10-12 ENCOUNTER — Encounter: Payer: Self-pay | Admitting: Family Medicine

## 2013-10-12 ENCOUNTER — Ambulatory Visit (INDEPENDENT_AMBULATORY_CARE_PROVIDER_SITE_OTHER): Payer: Medicare Other | Admitting: Family Medicine

## 2013-10-12 VITALS — BP 116/76 | HR 72 | Ht 65.0 in | Wt 194.0 lb

## 2013-10-12 DIAGNOSIS — E785 Hyperlipidemia, unspecified: Secondary | ICD-10-CM

## 2013-10-12 DIAGNOSIS — K760 Fatty (change of) liver, not elsewhere classified: Secondary | ICD-10-CM

## 2013-10-12 DIAGNOSIS — E039 Hypothyroidism, unspecified: Secondary | ICD-10-CM

## 2013-10-12 DIAGNOSIS — K7689 Other specified diseases of liver: Secondary | ICD-10-CM

## 2013-10-12 DIAGNOSIS — R002 Palpitations: Secondary | ICD-10-CM

## 2013-10-12 LAB — TSH: TSH: 0.956 u[IU]/mL (ref 0.350–4.500)

## 2013-10-12 NOTE — Patient Instructions (Signed)
  Please check your pulse periodically, to see how often you are having skipped/extra beats.  That way, if/when you become symptomatic with the slight shortness of breath and chest pressure, you can check to see if it is any different than when you feel normal.

## 2013-10-12 NOTE — Progress Notes (Signed)
Chief Complaint  Patient presents with  . Atrial Flutter    feels like her heart misses a beat every now and then. Has had this issue for a while-has seen cardiology(Dr.Wall)before. Has had a few little sharp chest pains that only last a second and go away. Has been told that it could be from her MS or side effect from Pravastatin(on insert).    Patient presents with complaint of irregular heart beat.  She has had this problem off and on for years. Last episode was 4-5 days ago, described as a pressure on her chest, with an associated "flutter" in her chest.  She notices skipped beats when she checks her pulse.  Lasted 2-3 days, coming and going.  She thinks the pressure may have been associated with more frequent skipped beats, but she wasn't feeling her pulse regularly when feeling the pressure to confirm this suspicion.  Yesterday she checked pulse 10 times, 8/10 times she noticed the missing beat.  She had minimal lightheadedness, but was feeling a lot better than she had been a few days prior.  Today she feels normal, no lightheadedness, chest pressure.  Denies any change in caffeine intake (1.5/day), no decongestant use, no dehydration, no diet pills.  She has been on Provigil, dose hasn't changed.  Dr. Buddy Duty checks her thyroid, and it has been fine, no dose changes.  Review of scanned records shows that last TSH was 0.26 in 12/2012 on her current dose.  He reports goal 0.2-2.  She saw Dr. Verl Blalock for this in the past; had stress test and echocardiogram 2 years ago and was told everything was okay. Doesn't recall being told any MVP or other abnormality. Chart was reviewed and visit from Dr. Verl Blalock in 05/2010 revealed: She's had negative chest x-ray, stress nuclear study that shows no ischemia with an ejection fraction of 77%, and a 2-D echocardiogram is stable from 2009. Specifically, she has normal left ventricular function, mild mitral regurgitation, mild left atrial enlargement, and a pulmonary artery  pressure 35 mm. Pt was on Inderal LA 120mg  at that time as well.  She states she needs lipids and liver enzymes checked.  She had some orange juice this morning. Review of chart shows that she was due to have fasting lipids and LFT's last month, future orders are in the system.  She is not fasting today.  Past Medical History  Diagnosis Date  . PULMONARY EMBOLISM 11/2006    post op  . DEEP VENOUS THROMBOPHLEBITIS 11/2006    post surg, anticoag x 6 mo  . DIABETES MELLITUS, TYPE II     Dr. Buddy Duty  . GERD   . HYPERLIPIDEMIA   . HYPERTENSION   . NEUROPATHY     related to MS  . HYPOTHYROIDISM     Dr. Buddy Duty  . MENOPAUSAL SYNDROME   . Multiple sclerosis     Dr. Jacqulynn Cadet (in [place])  . SINUS TARSI SYNDROME   . IBS   . TRANSAMINASES, SERUM, ELEVATED     Dr. Carlean Purl, referred to [facility]- [facility]  . Complication of anesthesia     difficulty waking  . TUBULOVILLOUS ADENOMA, COLON, HX OF 09/21/2008    Annotation: cecum, with high-grade dysplasia original colonoscopy Ganem resected by Dr. Johney Maine post-op leak and complicated course Qualifier: Diagnosis of  By: Carlean Purl MD, Dimas Millin   . Gout   . Fatty liver disease, nonalcoholic 07/30/9483       . PONV (postoperative nausea and vomiting)  needs scop patch  . Wears glasses    Past Surgical History  Procedure Laterality Date  . Laraoscopic cecal polyp resection  11/2006    Right  . Ex lap and ileostomy/hartmanns pouch for anatamotic leak  11/2006  . Ileostomy reversal  04/2007  . Cesarean section      x's 2  . Abdominoplasty  2004  . Thyroidectomy, partial  1990    right  . Colonoscopy      multiple  . Colonoscopy  11/05/2011    Procedure: COLONOSCOPY;  Surgeon: Gatha Mayer, MD;  Location: [facility];  Service: Endoscopy;  Laterality: N/A;  . Cholecystectomy  2004    lap choli  . Tonsillectomy    . Mass excision Left 07/04/2013    Procedure: LEFT PALM EXCISION MASS;  Surgeon: Tennis Must, MD;  Location: [facility];  Service: Orthopedics;  Laterality: Left;   History   Social History  . Marital Status: Married    Spouse Name: N/A    Number of Children: 3  . Years of Education: N/A   Occupational History  . 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 Smoking status: Never Smoker   . Smokeless tobacco: Never Used  . Alcohol Use: No  . Drug Use: No  . Sexual Activity: Not on file   Other Topics Concern  . Not on file   Social History Narrative   Lives with husband, 1 dog.  Daughter Larene Beach) lives in [place].  Son in [place] and son in [place].   Outpatient Encounter Prescriptions as of 10/12/2013  Medication Sig  . aspirin 81 MG tablet Take 81 mg by mouth daily.   . fish oil-omega-3 fatty acids 1000 MG capsule Take 4 g by mouth daily.   . furosemide (LASIX) 20 MG tablet TAKE 1 TABLET BY MOUTH EVERY DAY  . levothyroxine (SYNTHROID, LEVOTHROID) 137 MCG tablet Take 137 mcg by mouth daily.  Marland Kitchen losartan-hydrochlorothiazide (HYZAAR) 50-12.5 MG per tablet TAKE 1 TABLET BY MOUTH DAILY.  . metFORMIN (GLUCOPHAGE) 500 MG tablet Take 500 mg by mouth 2 (two) times daily with a meal.  . modafinil (PROVIGIL) 200 MG tablet Take 1 tablet (200 mg total) by mouth 2 (two) times daily as needed.  . Multiple Vitamin (MULTIVITAMIN) tablet Take 1 tablet by mouth daily.    Marland Kitchen omeprazole (PRILOSEC) 20 MG capsule TAKE ONE CAPSULE BY MOUTH EVERY DAY  . pravastatin (PRAVACHOL) 40 MG tablet TAKE 1 TABLET BY MOUTH EVERY DAY  . propranolol ER (INDERAL LA) 120 MG 24 hr capsule TAKE ONE CAPSULE BY MOUTH DAILY  . TRUEPLUS LANCETS 28G MISC by Does not apply route as needed.    . TRUETEST TEST test strip USE TWICE DAILY TO CHECK BLOOD SUGARS  . acyclovir (ZOVIRAX) 400 MG tablet Take 400 mg by mouth 3 (three) times daily as needed.    . colchicine (COLCRYS) 0.6 MG tablet Take 2 tablets at onset of gout attack.  Take additional 1 tablet in 1 hour, if needed  .  diphenoxylate-atropine (LOMOTIL) 2.5-0.025 MG per tablet TAKE 1 TO 2 TABLETS BY MOUTH EVERY 6 HOURS AS NEEDED FOR DIARRHEA  . triamcinolone cream (KENALOG) 0.1 % Apply topically 2 (two) times daily. Use sparingly to affected area  . [DISCONTINUED] HYDROcodone-acetaminophen (NORCO) 5-325 MG per tablet 1-2 tabs po q6 hours prn pain  . [DISCONTINUED] metroNIDAZOLE (METROCREAM) 0.75 % cream Apply 1 application topically  2 (two) times daily.    No Known Allergies  ROS:  Denies fevers, chills, URI symptoms, cough.  Currently without chest pain, palpitations or shortness of breath--see HPI.  Denies nausea, vomiting, diarrhea, bleeding, bruising, rashes, or any other complaints except as per HPI.  PHYSICAL EXAM: BP 116/76  Pulse 72  Ht 5\' 5"  (1.651 m)  Wt 194 lb (87.998 kg)  BMI 32.28 kg/m2 Well developed, pleasant female in no distress Heart: Regular and rhythm, no ectopy. Click heard best at LUSB.  No ectopy, murmur, rub or gallop Lungs: clear bilaterally Neck: no lymphadenopathy, thyromegaly o rmass  ASSESSMENT/PLAN:  Palpitations - suspect PAC/PVC's, intermittent.  None heard today.  continue beta blocker.  r/o overreplacement of thyroid.  f/u if persists/worsens - Plan: TSH  Unspecified hypothyroidism - managed by Dr. Buddy Duty.  Send copies of TSH to Dr. Buddy Duty - Plan: TSH  HYPERLIPIDEMIA - past due for recheck--return fasting  Fatty liver disease, nonalcoholic - elevated transaminases - due for recheck.  Will have drawn when she returns fasting for lipids (future orders already in system)  Due to her not being entirely fasting, asked her to return for fasting labs (orders already in system for LFT's and lipids)  Please check your pulse periodically, to see how often you are having skipped/extra beats.  That way, if/when you become symptomatic with the slight shortness of breath and chest pressure, you can check to see if it is any different than when you feel normal.  TSH today.  Fax  results to Dr. Buddy Duty  Patient with many questions; answered to best of my ability, and counseled.  25 min visit, more than 1/2 spent counseling

## 2013-11-04 ENCOUNTER — Other Ambulatory Visit: Payer: Medicare Other

## 2013-11-04 DIAGNOSIS — E782 Mixed hyperlipidemia: Secondary | ICD-10-CM

## 2013-11-04 LAB — LIPID PANEL
Cholesterol: 149 mg/dL (ref 0–200)
HDL: 37 mg/dL — ABNORMAL LOW (ref >39)
LDL Cholesterol: 61 mg/dL (ref 0–99)
Total CHOL/HDL Ratio: 4 Ratio
Triglycerides: 253 mg/dL — ABNORMAL HIGH (ref <150)
VLDL: 51 mg/dL — AB (ref 0–40)

## 2013-11-04 LAB — HEPATIC FUNCTION PANEL
ALT: 71 U/L — ABNORMAL HIGH (ref 0–35)
AST: 45 U/L — ABNORMAL HIGH (ref 0–37)
Albumin: 4.4 g/dL (ref 3.5–5.2)
Alkaline Phosphatase: 86 U/L (ref 39–117)
BILIRUBIN DIRECT: 0.1 mg/dL (ref 0.0–0.3)
BILIRUBIN INDIRECT: 0.5 mg/dL (ref 0.2–1.2)
BILIRUBIN TOTAL: 0.6 mg/dL (ref 0.2–1.2)
Total Protein: 6.9 g/dL (ref 6.0–8.3)

## 2013-11-13 ENCOUNTER — Other Ambulatory Visit: Payer: Self-pay | Admitting: Family Medicine

## 2013-11-15 ENCOUNTER — Other Ambulatory Visit: Payer: Self-pay | Admitting: Family Medicine

## 2013-11-27 ENCOUNTER — Other Ambulatory Visit: Payer: Self-pay | Admitting: Family Medicine

## 2013-12-01 ENCOUNTER — Other Ambulatory Visit: Payer: Self-pay | Admitting: Family Medicine

## 2013-12-15 ENCOUNTER — Other Ambulatory Visit: Payer: Self-pay | Admitting: Family Medicine

## 2013-12-15 DIAGNOSIS — Z1231 Encounter for screening mammogram for malignant neoplasm of breast: Secondary | ICD-10-CM

## 2013-12-16 ENCOUNTER — Other Ambulatory Visit: Payer: Self-pay | Admitting: Family Medicine

## 2013-12-16 NOTE — Telephone Encounter (Signed)
[facility] for #90, no refill.  She needs to schedule appointment to discuss her lipids (labs done last month, she still hasn't scheduled yet)

## 2013-12-16 NOTE — Telephone Encounter (Signed)
Is this ok to refill?  

## 2013-12-29 ENCOUNTER — Other Ambulatory Visit: Payer: Self-pay | Admitting: Family Medicine

## 2014-01-02 ENCOUNTER — Institutional Professional Consult (permissible substitution): Payer: Medicare Other | Admitting: Family Medicine

## 2014-01-03 ENCOUNTER — Ambulatory Visit (HOSPITAL_COMMUNITY)
Admission: RE | Admit: 2014-01-03 | Discharge: 2014-01-03 | Disposition: A | Discharge status: Home / Self Care | Payer: Medicare Other | Source: Ambulatory Visit | Attending: Family Medicine | Admitting: Family Medicine

## 2014-01-03 DIAGNOSIS — Z1231 Encounter for screening mammogram for malignant neoplasm of breast: Secondary | ICD-10-CM

## 2014-01-05 ENCOUNTER — Encounter: Payer: Self-pay | Admitting: Family Medicine

## 2014-01-05 ENCOUNTER — Ambulatory Visit (INDEPENDENT_AMBULATORY_CARE_PROVIDER_SITE_OTHER): Payer: Medicare Other | Admitting: Family Medicine

## 2014-01-05 VITALS — BP 120/82 | HR 60 | Ht 65.0 in | Wt 195.0 lb

## 2014-01-05 DIAGNOSIS — E782 Mixed hyperlipidemia: Secondary | ICD-10-CM

## 2014-01-05 DIAGNOSIS — I10 Essential (primary) hypertension: Secondary | ICD-10-CM

## 2014-01-05 DIAGNOSIS — K760 Fatty (change of) liver, not elsewhere classified: Secondary | ICD-10-CM

## 2014-01-05 DIAGNOSIS — M109 Gout, unspecified: Secondary | ICD-10-CM

## 2014-01-05 DIAGNOSIS — K7689 Other specified diseases of liver: Secondary | ICD-10-CM

## 2014-01-05 MED ORDER — COLCHICINE 0.6 MG PO TABS: ORAL_TABLET | ORAL | Status: DC

## 2014-01-05 MED ORDER — PRAVASTATIN SODIUM 80 MG PO TABS: ORAL_TABLET | ORAL | Status: DC

## 2014-01-05 NOTE — Progress Notes (Signed)
Chief Complaint  Patient presents with  . Follow-up    follow up on trigs from 11/04/13. Needs refill on Colcrys.    Patient presents for follow up on her lipids.  She sees Dr. Chalmers Cater for her diabetes and thyroid. She overall has been doing well, feeling good, with no complaints.  With respect to her diet, she reports that she keeps a food diary.  Switched to twiggy/nutty/seeded breads, not white.  Limiting carbs.  Eats out a lot.  Only drinks water, plus one diet soda daily. She doesn't snack on sweets, doesn't even keep it in the house.  She will have a portion of a slice of cake every 1-2 weeks.  Rarely eats fried foods.  She occasionally forgets to take the evening fish oil capsules, as she has been taking them at bedtime, and sometimes forgets.  Gout:  She is asking for refill on Colcrys.  She hasn't had a flare in years.  She noticed just a slight bit of swelling in her knuckle recently, and saw that she had only 2-3 pills left in her bottle, and that they were very old. She is going on vacation, and would like refill to take with her just in case.  Periodically still has some heart flutters, comes and goes.  Sometimes can last for an hour (and sometimes can radiate to jaw, has pressure in chest).  She will have about a 2 week period where she has some ongoing intermittent discomfort, then gradually fades/completely resolves.  Similar symptoms since 1985, last episode in April, when she was seen here (she reports having been evaluated multiple times in the past, including stress tests, all normal).  Past Medical History  Diagnosis Date  . PULMONARY EMBOLISM 11/2006    post op  . DEEP VENOUS THROMBOPHLEBITIS 11/2006    post surg, anticoag x 6 mo  . DIABETES MELLITUS, TYPE II     Dr. Buddy Duty  . GERD   . HYPERLIPIDEMIA   . HYPERTENSION   . NEUROPATHY     related to MS  . HYPOTHYROIDISM     Dr. Buddy Duty  . MENOPAUSAL SYNDROME   . Multiple sclerosis     Dr. Jacqulynn Cadet (in [place])  . SINUS TARSI  SYNDROME   . IBS   . TRANSAMINASES, SERUM, ELEVATED     Dr. Carlean Purl, referred to [facility]- [facility]  . Complication of anesthesia     difficulty waking  . TUBULOVILLOUS ADENOMA, COLON, HX OF 09/21/2008    Annotation: cecum, with high-grade dysplasia original colonoscopy Ganem resected by Dr. Johney Maine post-op leak and complicated course Qualifier: Diagnosis of  By: Carlean Purl MD, Dimas Millin   . Gout   . Fatty liver disease, nonalcoholic 06/28/1094       . PONV (postoperative nausea and vomiting)     needs scop patch  . Wears glasses   . HSV-1 (herpes simplex virus 1) infection     (sores below nose)--very rare  . Chronic diarrhea     (since ileocecal valve removed 2008)   Past Surgical History  Procedure Laterality Date  . Laraoscopic cecal polyp resection  11/2006    Right  . Ex lap and ileostomy/hartmanns pouch for anatamotic leak  11/2006  . Ileostomy reversal  04/2007  . Cesarean section      x's 2  . Abdominoplasty  2004  . Thyroidectomy, partial  1990    right  . Colonoscopy      multiple  . Colonoscopy  11/05/2011  Procedure: COLONOSCOPY;  Surgeon: Gatha Mayer, MD;  Location: [facility];  Service: Endoscopy;  Laterality: N/A;  . Cholecystectomy  2004    lap choli  . Tonsillectomy    . Mass excision Left 07/04/2013    Procedure: LEFT PALM EXCISION MASS;  Surgeon: Tennis Must, MD;  Location: [place];  Service: Orthopedics;  Laterality: Left;   History   Social History  . Marital Status: Married    Spouse Name: N/A    Number of Children: 3  . Years of Education: N/A   Occupational History  . 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 Smoking status: Never Smoker   . Smokeless tobacco: Never Used  . Alcohol Use: No  . Drug Use: No  . Sexual Activity: Not on file   Other Topics Concern  . Not on file   Social History Narrative   Lives with husband, 1 dog.  Daughter Larene Beach) lives  in [place].  Son in [place] and son in [place]. 4 grandchildren total (2 here in [place])   Outpatient Encounter Prescriptions as of 01/05/2014  Medication Sig Note  . aspirin 81 MG tablet Take 81 mg by mouth daily.    . fish oil-omega-3 fatty acids 1000 MG capsule Take 4 g by mouth daily.  01/05/2014: Taking 2 BID  . furosemide (LASIX) 20 MG tablet TAKE 1 TABLET BY MOUTH EVERY DAY   . levothyroxine (SYNTHROID, LEVOTHROID) 137 MCG tablet Take 137 mcg by mouth daily. 03/08/2012: rx'd by Dr. Buddy Duty  . losartan-hydrochlorothiazide (HYZAAR) 50-12.5 MG per tablet TAKE 1 TABLET BY MOUTH DAILY.   . metFORMIN (GLUCOPHAGE) 500 MG tablet Take 500 mg by mouth 2 (two) times daily with a meal. 03/08/2012: rx'd by Dr. Buddy Duty  . modafinil (PROVIGIL) 100 MG tablet Take 100 mg by mouth daily. 01/05/2014: rx'd by neuro  . Multiple Vitamin (MULTIVITAMIN) tablet Take 1 tablet by mouth daily.     Marland Kitchen omeprazole (PRILOSEC) 20 MG capsule TAKE ONE CAPSULE BY MOUTH EVERY DAY   . pravastatin (PRAVACHOL) 80 MG tablet TAKE 1 TABLET BY MOUTH EVERY DAY   . propranolol ER (INDERAL LA) 120 MG 24 hr capsule TAKE ONE CAPSULE BY MOUTH DAILY 01/05/2014: Originally prescribed by neuro; helps with tremor  . TRUEPLUS LANCETS 28G MISC by Does not apply route as needed.     . TRUETEST TEST test strip USE TWICE DAILY TO CHECK BLOOD SUGARS   . [DISCONTINUED] modafinil (PROVIGIL) 200 MG tablet Take 1 tablet (200 mg total) by mouth 2 (two) times daily as needed. 03/08/2012: From neuro  . [DISCONTINUED] pravastatin (PRAVACHOL) 40 MG tablet TAKE 1 TABLET BY MOUTH EVERY DAY   . acyclovir (ZOVIRAX) 400 MG tablet Take 400 mg by mouth 3 (three) times daily as needed.   01/05/2014: Hasn't needed in years (prn cold sores)  . colchicine (COLCRYS) 0.6 MG tablet Take 2 tablets at onset of gout attack.  Take additional 1 tablet in 1 hour, if needed   . diphenoxylate-atropine (LOMOTIL) 2.5-0.025 MG per tablet TAKE 1 TO 2 TABLETS BY MOUTH EVERY 6 HOURS AS NEEDED FOR DIARRHEA  01/05/2014: Uses prn for diarrhea, usually once or twice a week   . triamcinolone cream (KENALOG) 0.1 % Apply topically 2 (two) times daily. Use sparingly to affected area   . [DISCONTINUED] colchicine (COLCRYS) 0.6 MG tablet Take 2 tablets at onset of gout attack.  Take additional 1 tablet  in 1 hour, if needed 01/05/2014: Rarely needs; meds have expired  . [DISCONTINUED] propranolol ER (INDERAL LA) 120 MG 24 hr capsule TAKE ONE CAPSULE BY MOUTH DAILY   . [DISCONTINUED] propranolol ER (INDERAL LA) 120 MG 24 hr capsule TAKE ONE CAPSULE BY MOUTH DAILY   . [DISCONTINUED] propranolol ER (INDERAL LA) 120 MG 24 hr capsule TAKE ONE CAPSULE BY MOUTH DAILY    No Known Allergies  ROS:  Denies fevers, chills, URI symptoms, headaches. No recent chest pain or palpitations. Denies cough, shortness of breath, GI complaints.  No gout flare.  See HPI.  PHYSICAL EXAM: BP 120/82  Pulse 60  Ht 5\' 5"  (1.651 m)  Wt 195 lb (88.451 kg)  BMI 32.45 kg/m2 Pleasant female, in no distress Remainder of exam was limited to discussion/counseling  Lab Results  Component Value Date   CHOL 149 11/04/2013   HDL 37* 11/04/2013   LDLCALC 61 11/04/2013   LDLDIRECT 111.6 10/21/2010   TRIG 253* 11/04/2013   CHOLHDL 4.0 11/04/2013    Lab Results  Component Value Date   ALT 71* 11/04/2013   AST 45* 11/04/2013   ALKPHOS 86 11/04/2013   BILITOT 0.6 11/04/2013   ASSESSMENT/PLAN:  HYPERTENSION  Mixed hyperlipidemia - Plan: pravastatin (PRAVACHOL) 80 MG tablet, Lipid panel, Hepatic function panel  Fatty liver disease, nonalcoholic - elevated transaminases - Plan: Hepatic function panel  Acute gouty arthritis - Plan: colchicine (COLCRYS) 0.6 MG tablet  We reviewed lowfat diet.  Discussed medication options to get TG under goal.  Options include increasing pravastatin to 80mg  (along with changing time of evening fish oil capsules to remember more regularly)--knowing this would also decrease LDL further, but no harm in having  that too low.  May or may not help enough to get TG to goal, but ensuring 4g of fish oil daily might also contribute to reaching goal.  We also discussed fibrates (I would start at low dose; risks of myopathy reviewed) as well as Niaspan.  These 2 latter choices were discussed in detail (risks/benefits/side effects) so that we can make these changes without another OV, based on her f/u labs.  She opted to start with the higher pravastatin dose, and changing the time she takes her fish oil, to ensure 4gm daily.  If not at goal, and/or if not tolerating then change to low dose fenofibrate as next step.  30 min visit, more than 1/2 counseling

## 2014-01-05 NOTE — Patient Instructions (Signed)
Increase the pravastatin to 80mg  daily. Try changing the time of your afternoon/evening fish oil, to better ensure that you get all 4 in each day. Continue lowfat diet.  Return in 6-8 weeks for fasting labs. Call sooner if you aren't tolerating this medication, and then we will move on to plan B (low dose fenofibrate).

## 2014-01-06 ENCOUNTER — Encounter: Payer: Self-pay | Admitting: Family Medicine

## 2014-02-16 ENCOUNTER — Other Ambulatory Visit: Payer: Self-pay | Admitting: Family Medicine

## 2014-03-14 ENCOUNTER — Other Ambulatory Visit: Payer: Self-pay | Admitting: Internal Medicine

## 2014-03-14 ENCOUNTER — Other Ambulatory Visit: Payer: Self-pay | Admitting: Psychiatry

## 2014-03-14 DIAGNOSIS — E049 Nontoxic goiter, unspecified: Secondary | ICD-10-CM

## 2014-03-14 DIAGNOSIS — G35 Multiple sclerosis: Secondary | ICD-10-CM

## 2014-03-14 DIAGNOSIS — E063 Autoimmune thyroiditis: Secondary | ICD-10-CM

## 2014-03-16 ENCOUNTER — Encounter: Payer: Self-pay | Admitting: Family Medicine

## 2014-03-16 ENCOUNTER — Ambulatory Visit (INDEPENDENT_AMBULATORY_CARE_PROVIDER_SITE_OTHER): Payer: Medicare Other | Admitting: Family Medicine

## 2014-03-16 ENCOUNTER — Ambulatory Visit
Admission: RE | Admit: 2014-03-16 | Discharge: 2014-03-16 | Disposition: A | Discharge status: Home / Self Care | Payer: Medicare Other | Source: Ambulatory Visit | Attending: Family Medicine | Admitting: Family Medicine

## 2014-03-16 VITALS — BP 110/70 | HR 80 | Ht 65.0 in | Wt 190.0 lb

## 2014-03-16 DIAGNOSIS — I10 Essential (primary) hypertension: Secondary | ICD-10-CM

## 2014-03-16 DIAGNOSIS — R079 Chest pain, unspecified: Secondary | ICD-10-CM

## 2014-03-16 DIAGNOSIS — E782 Mixed hyperlipidemia: Secondary | ICD-10-CM

## 2014-03-16 DIAGNOSIS — M546 Pain in thoracic spine: Secondary | ICD-10-CM

## 2014-03-16 NOTE — Patient Instructions (Signed)
  Go to [facility] to get chest x-ray. You can try heat and stretches as shown for the upper back discomfort, as well as use tylenol or NSAIDs as needed. Next time you get sweaty, please check a blood sugar (can be a symptom of hypoglycemia). I'm not sure how the hands tingling fit in.  Pay attention to which fingers (or all 5, if that is the case) are affected if this recurs (?thumb AND pinkie?)  Follow up with your neurologist if you have ongoing problems with the tinglng in your hands.

## 2014-03-16 NOTE — Progress Notes (Signed)
Chief Complaint  Patient presents with  . Chest Pain    x 1 week, it would come and go. Woke up Monday night with jaw pain and sharp chest pain. Last night woke up with back pain, sweating, nausea and hand tingling. Today somewhat better-still feels somewhat nauseous. Would like an EKG today.    She has had gradual onset of pressure in her chest (lasts a day, goes away, then gets worse). She had three episodes over the last week--first was about a week ago, second was Monday night, and last night was the third.  Last night her symptoms were at their most severe. While making dinner last night (no exertion) she noticed an aching in the middle of her back, associated with nausea and pressure in her chest, with slight radiation into the left jaw (was worse the day prior).  She then developed tingling in both of her hands.  The chest pressure came/went. The ache in the upper back persisted, until she went to bed (other symptoms had resolved--hand tingling lasted for an hour). She woke up at 2 am with a sweaty face, ongoing pain in upper back.  This morning, all she has left is the persistent discomfort in upper back. Currently denies chest pain/pressure, jaw discomfort or hand tingling.  She didn't have any of the fluttering that is more typical for her.  As we discussed at her July visit: Periodically still has some heart flutters, comes and goes. Sometimes can last for an hour (and sometimes can radiate to jaw, has pressure in chest). She will have about a 2 week period where she has some ongoing intermittent discomfort, then gradually fades/completely resolves. Similar symptoms since 1985, last episode in April, when she was seen here (she reports having been evaluated multiple times in the past, including stress tests, all normal).  She has never had the hands tingling or the discomfort in her upper back. She hasn't tried anything for the discomfort in her upper back.  She describes it as a dull ache.   It was not painful to press on it (had her husband do it last night).  Sugar yesterday afternoon was 110. Didn't check it when diaphoretic last night.  Past Medical History  Diagnosis Date  . PULMONARY EMBOLISM 11/2006    post op  . DEEP VENOUS THROMBOPHLEBITIS 11/2006    post surg, anticoag x 6 mo  . DIABETES MELLITUS, TYPE II     Dr. Buddy Duty  . GERD   . HYPERLIPIDEMIA   . HYPERTENSION   . NEUROPATHY     related to MS  . HYPOTHYROIDISM     Dr. Buddy Duty  . MENOPAUSAL SYNDROME   . Multiple sclerosis     Dr. Jacqulynn Cadet (in [place])  . SINUS TARSI SYNDROME   . IBS   . TRANSAMINASES, SERUM, ELEVATED     Dr. Carlean Purl, referred to [facility]- [facility]  . Complication of anesthesia     difficulty waking  . TUBULOVILLOUS ADENOMA, COLON, HX OF 09/21/2008    Annotation: cecum, with high-grade dysplasia original colonoscopy Ganem resected by Dr. Johney Maine post-op leak and complicated course Qualifier: Diagnosis of  By: Carlean Purl MD, Dimas Millin   . Gout   . Fatty liver disease, nonalcoholic 08/23/2023       . PONV (postoperative nausea and vomiting)     needs scop patch  . Wears glasses   . HSV-1 (herpes simplex virus 1) infection     (sores below nose)--very rare  . Chronic  diarrhea     (since ileocecal valve removed 2008)   Past Surgical History  Procedure Laterality Date  . Laraoscopic cecal polyp resection  11/2006    Right  . Ex lap and ileostomy/hartmanns pouch for anatamotic leak  11/2006  . Ileostomy reversal  04/2007  . Cesarean section      x's 2  . Abdominoplasty  2004  . Thyroidectomy, partial  1990    right  . Colonoscopy      multiple  . Colonoscopy  11/05/2011    Procedure: COLONOSCOPY;  Surgeon: Gatha Mayer, MD;  Location: [facility];  Service: Endoscopy;  Laterality: N/A;  . Cholecystectomy  2004    lap choli  . Tonsillectomy    . Mass excision Left 07/04/2013    Procedure: LEFT PALM EXCISION MASS;  Surgeon: Tennis Must, MD;  Location: [place];  Service:  Orthopedics;  Laterality: Left;   History   Social History  . Marital Status: Married    Spouse Name: N/A    Number of Children: 3  . Years of Education: N/A   Occupational History  . 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 Smoking status: Never Smoker   . Smokeless tobacco: Never Used  . Alcohol Use: No  . Drug Use: No  . Sexual Activity: Not on file   Other Topics Concern  . Not on file   Social History Narrative   Lives with husband, 1 dog.  Daughter Larene Beach) lives in [place].  Son in [place] and son in [place]. 4 grandchildren total (2 here in [place])   Outpatient Encounter Prescriptions as of 03/16/2014  Medication Sig Note  . aspirin 81 MG tablet Take 81 mg by mouth daily.    . fish oil-omega-3 fatty acids 1000 MG capsule Take 4 g by mouth daily.  01/05/2014: Taking 2 BID  . furosemide (LASIX) 20 MG tablet TAKE 1 TABLET BY MOUTH EVERY DAY   . levothyroxine (SYNTHROID, LEVOTHROID) 137 MCG tablet Take 137 mcg by mouth daily. 03/08/2012: rx'd by Dr. Buddy Duty  . losartan-hydrochlorothiazide (HYZAAR) 50-12.5 MG per tablet TAKE 1 TABLET BY MOUTH DAILY.   . metFORMIN (GLUCOPHAGE) 500 MG tablet Take 500 mg by mouth 2 (two) times daily with a meal. 03/08/2012: rx'd by Dr. Buddy Duty  . modafinil (PROVIGIL) 100 MG tablet Take 100 mg by mouth daily. 01/05/2014: rx'd by neuro  . Multiple Vitamin (MULTIVITAMIN) tablet Take 1 tablet by mouth daily.     Marland Kitchen omeprazole (PRILOSEC) 20 MG capsule TAKE ONE CAPSULE BY MOUTH EVERY DAY   . pravastatin (PRAVACHOL) 80 MG tablet TAKE 1 TABLET BY MOUTH EVERY DAY   . propranolol ER (INDERAL LA) 120 MG 24 hr capsule TAKE ONE CAPSULE BY MOUTH DAILY 01/05/2014: Originally prescribed by neuro; helps with tremor  . TRUEPLUS LANCETS 28G MISC by Does not apply route as needed.     . TRUETEST TEST test strip USE TWICE DAILY TO CHECK BLOOD SUGARS   . acyclovir (ZOVIRAX) 400 MG tablet Take 400 mg by mouth 3 (three) times  daily as needed.   01/05/2014: Hasn't needed in years (prn cold sores)  . colchicine (COLCRYS) 0.6 MG tablet Take 2 tablets at onset of gout attack.  Take additional 1 tablet in 1 hour, if needed   . diphenoxylate-atropine (LOMOTIL) 2.5-0.025 MG per tablet TAKE 1 TO 2 TABLETS BY MOUTH EVERY 6 HOURS AS NEEDED FOR DIARRHEA 01/05/2014:  Uses prn for diarrhea, usually once or twice a week   . triamcinolone cream (KENALOG) 0.1 % Apply topically 2 (two) times daily. Use sparingly to affected area    No Known Allergies  ROS:  No fevers, chills, URI symptoms, allergy symptoms, cough, shortness of breath, vomiting, diarrhea or other GI complaints.  No GU complaints, bleeding, bruising, rashes, depression, anxiety or other complaints, except as noted in HPI  PHYSICAL EXAM: BP 110/70  Pulse 80  Ht 5\' 5"  (1.651 m)  Wt 190 lb (86.183 kg)  BMI 31.62 kg/m2  Well developed, pleasant female, mildly anxious HEENT:  PERRL, EOMI, conjunctiva clear. Mucus membranes moist Neck: Supple, no lymphadenopathy; thyroid: L part of thyroid is dominant (larger than right)--no dominant mass or nodule noted; no carotid bruit or JVD  No c-spine tenderness.  No thoracic or lumbar spinal tenderness.  nontender over muscles, no spasm.  Area of discomfort is at rhomboids--nontender to palpation, no spasm (but this is the area of her discomfort) Heart: regular rate and rhythm Lungs: clear bilaterally Abdomen: soft, nontender, no mass Chest:  nontender to palpation Extremities: no edema   EKG: NSR, rate 71.  When compared to EKG from 07/2012, slightly more prominent TWI in III (was flat in 2014), otherwise completely unchanged. CXR ordered, along with thoracic spine films  ASSESSMENT/PLAN:  HYPERTENSION - Plan: EKG 12-Lead  Mixed hyperlipidemia  Chest pain, unspecified chest pain type - Plan: DG Chest 2 View, DG Thoracic Spine 2 View, EKG 12-Lead  Bilateral thoracic back pain - Plan: DG Chest 2 View, DG Thoracic Spine 2  View  DDx reviewed, all questions answered.  Go to [facility] to get chest x-ray. You can try heat and stretches as shown for the upper back discomfort, as well as use tylenol or NSAIDs as needed. Next time you get sweaty, please check a blood sugar (can be a symptom of hypoglycemia). I'm not sure how the hands tingling fit in.  Pay attention to which fingers (or all 5, if that is the case) are affected if this recurs (?thumb AND pinkie?)   F/u prn

## 2014-03-20 ENCOUNTER — Other Ambulatory Visit: Payer: Medicare Other

## 2014-03-20 DIAGNOSIS — K760 Fatty (change of) liver, not elsewhere classified: Secondary | ICD-10-CM

## 2014-03-20 DIAGNOSIS — E782 Mixed hyperlipidemia: Secondary | ICD-10-CM

## 2014-03-20 LAB — HEPATIC FUNCTION PANEL
ALBUMIN: 4.2 g/dL (ref 3.5–5.2)
ALT: 60 U/L — ABNORMAL HIGH (ref 0–35)
AST: 40 U/L — AB (ref 0–37)
Alkaline Phosphatase: 74 U/L (ref 39–117)
BILIRUBIN TOTAL: 0.6 mg/dL (ref 0.2–1.2)
Bilirubin, Direct: 0.1 mg/dL (ref 0.0–0.3)
Indirect Bilirubin: 0.5 mg/dL (ref 0.2–1.2)
Total Protein: 6.5 g/dL (ref 6.0–8.3)

## 2014-03-20 LAB — LIPID PANEL
CHOL/HDL RATIO: 3 ratio
Cholesterol: 108 mg/dL (ref 0–200)
HDL: 36 mg/dL — AB (ref >39)
LDL CALC: 33 mg/dL (ref 0–99)
TRIGLYCERIDES: 196 mg/dL — AB (ref <150)
VLDL: 39 mg/dL (ref 0–40)

## 2014-03-27 ENCOUNTER — Other Ambulatory Visit: Payer: Medicare Other

## 2014-03-31 ENCOUNTER — Ambulatory Visit: Payer: Medicare Other | Admitting: Medical

## 2014-04-03 ENCOUNTER — Ambulatory Visit
Admission: RE | Admit: 2014-04-03 | Discharge: 2014-04-03 | Disposition: A | Discharge status: Home / Self Care | Payer: Medicare Other | Source: Ambulatory Visit | Attending: Internal Medicine | Admitting: Internal Medicine

## 2014-04-03 ENCOUNTER — Ambulatory Visit
Admission: RE | Admit: 2014-04-03 | Discharge: 2014-04-03 | Disposition: A | Discharge status: Home / Self Care | Payer: Medicare Other | Source: Ambulatory Visit | Attending: Psychiatry | Admitting: Psychiatry

## 2014-04-03 DIAGNOSIS — G35 Multiple sclerosis: Secondary | ICD-10-CM

## 2014-04-03 DIAGNOSIS — E049 Nontoxic goiter, unspecified: Secondary | ICD-10-CM

## 2014-04-03 DIAGNOSIS — E063 Autoimmune thyroiditis: Secondary | ICD-10-CM

## 2014-04-03 MED ORDER — GADOBENATE DIMEGLUMINE 529 MG/ML IV SOLN
17.0000 mL | Freq: Once | INTRAVENOUS | Status: AC | PRN
  Administered 2014-04-03: 17 mL via INTRAVENOUS

## 2014-04-17 ENCOUNTER — Other Ambulatory Visit: Payer: Self-pay | Admitting: Family Medicine

## 2014-04-24 ENCOUNTER — Encounter: Payer: Self-pay | Admitting: Family Medicine

## 2014-05-09 ENCOUNTER — Other Ambulatory Visit: Payer: Self-pay | Admitting: Family Medicine

## 2014-05-16 ENCOUNTER — Other Ambulatory Visit: Payer: Self-pay | Admitting: Family Medicine

## 2014-06-05 ENCOUNTER — Other Ambulatory Visit: Payer: Medicare Other

## 2014-06-05 DIAGNOSIS — E876 Hypokalemia: Secondary | ICD-10-CM

## 2014-06-06 LAB — BASIC METABOLIC PANEL
BUN: 8 mg/dL (ref 6–23)
CALCIUM: 9.9 mg/dL (ref 8.4–10.5)
CHLORIDE: 102 meq/L (ref 96–112)
CO2: 27 mEq/L (ref 19–32)
CREATININE: 0.71 mg/dL (ref 0.50–1.10)
Glucose, Bld: 144 mg/dL — ABNORMAL HIGH (ref 70–99)
Potassium: 3.4 mEq/L — ABNORMAL LOW (ref 3.5–5.3)
SODIUM: 143 meq/L (ref 135–145)

## 2014-06-15 ENCOUNTER — Encounter (HOSPITAL_COMMUNITY): Payer: Self-pay | Admitting: *Deleted

## 2014-06-15 ENCOUNTER — Emergency Department (HOSPITAL_COMMUNITY)
Admission: EM | Admit: 2014-06-15 | Discharge: 2014-06-15 | Disposition: A | Discharge status: Home / Self Care | Payer: Medicare Other | Source: Home / Self Care | Attending: Family Medicine | Admitting: Family Medicine

## 2014-06-15 DIAGNOSIS — M109 Gout, unspecified: Secondary | ICD-10-CM

## 2014-06-15 DIAGNOSIS — M10062 Idiopathic gout, left knee: Secondary | ICD-10-CM

## 2014-06-15 MED ORDER — COLCHICINE 0.6 MG PO TABS: ORAL_TABLET | ORAL | Status: DC

## 2014-06-15 NOTE — Discharge Instructions (Signed)

## 2014-06-15 NOTE — ED Provider Notes (Signed)
CSN: [id number]     Arrival date & time 06/15/14  1534 History   First MD Initiated Contact with Patient 06/15/14 1601     Chief Complaint  Patient presents with  . Knee Pain   (Consider location/radiation/quality/duration/timing/severity/associated sxs/prior Treatment) HPI Comments: Reports development of left knee pain with associated redness and swelling over the past 24 hours. No fever or malaise. No reported injury. Does endorse remote hx of gout. Has taken colchicine in past. Pain increases with pain and ambulation Recently began taking Augmentin prescribed by her PCP for sinusitis.   Patient is a 58 y.o. female presenting with knee pain. The history is provided by the patient.  Knee Pain Location:  Knee Time since incident:  24 hours Injury: no   Knee location:  L knee Pain details:    Quality:  Aching   Radiates to:  Does not radiate   Severity:  Moderate   Onset quality:  Gradual   Duration:  24 hours   Timing:  Constant   Progression:  Improving   Past Medical History  Diagnosis Date  . PULMONARY EMBOLISM 11/2006    post op  . DEEP VENOUS THROMBOPHLEBITIS 11/2006    post surg, anticoag x 6 mo  . DIABETES MELLITUS, TYPE II     Dr. Buddy Duty  . GERD   . HYPERLIPIDEMIA   . HYPERTENSION   . NEUROPATHY     related to MS  . HYPOTHYROIDISM     Dr. Buddy Duty  . MENOPAUSAL SYNDROME   . Multiple sclerosis     Dr. Jacqulynn Cadet (in [place])  . SINUS TARSI SYNDROME   . IBS   . TRANSAMINASES, SERUM, ELEVATED     Dr. Carlean Purl, referred to [facility]- [facility]  . Complication of anesthesia     difficulty waking  . TUBULOVILLOUS ADENOMA, COLON, HX OF 09/21/2008    Annotation: cecum, with high-grade dysplasia original colonoscopy Ganem resected by Dr. Johney Maine post-op leak and complicated course Qualifier: Diagnosis of  By: Carlean Purl MD, Dimas Millin   . Gout   . Fatty liver disease, nonalcoholic 10/23/7480       . PONV (postoperative nausea and vomiting)     needs scop patch  . Wears glasses    . HSV-1 (herpes simplex virus 1) infection     (sores below nose)--very rare  . Chronic diarrhea     (since ileocecal valve removed 2008)   Past Surgical History  Procedure Laterality Date  . Laraoscopic cecal polyp resection  11/2006    Right  . Ex lap and ileostomy/hartmanns pouch for anatamotic leak  11/2006  . Ileostomy reversal  04/2007  . Cesarean section      x's 2  . Abdominoplasty  2004  . Thyroidectomy, partial  1990    right  . Colonoscopy      multiple  . Colonoscopy  11/05/2011    Procedure: COLONOSCOPY;  Surgeon: Gatha Mayer, MD;  Location: [facility];  Service: Endoscopy;  Laterality: N/A;  . Cholecystectomy  2004    lap choli  . Tonsillectomy    . Mass excision Left 07/04/2013    Procedure: LEFT PALM EXCISION MASS;  Surgeon: Tennis Must, MD;  Location: [place];  Service: Orthopedics;  Laterality: Left;   Family History  Problem Relation Age of Onset  . Breast cancer Mother 73  . Colon cancer Mother 79    cancerous polyp removed age 58  . Pulmonary embolism Mother     on  warfarin x 30 years  . Deep vein thrombosis Mother     multiple/recurrent  . Diabetes Maternal Grandmother   . Diabetes Paternal Grandmother   . Stomach cancer Father   . Esophageal cancer Neg Hx   . Rectal cancer Neg Hx    History  Substance Use Topics  . Smoking status: Never Smoker   . Smokeless tobacco: Never Used  . Alcohol Use: No   OB History    Gravida Para Term Preterm AB TAB SAB Ectopic Multiple Living   3 3        3      Review of Systems  All other systems reviewed and are negative.   Allergies  Review of patient's allergies indicates no known allergies.  Home Medications   Prior to Admission medications   Medication Sig Start Date End Date Taking? Authorizing Provider  acyclovir (ZOVIRAX) 400 MG tablet Take 400 mg by mouth 3 (three) times daily as needed.      Historical Provider, MD  aspirin 81 MG tablet Take 81 mg by mouth daily.      Historical Provider, MD  colchicine (COLCRYS) 0.6 MG tablet Take 2 tablets at onset of gout attack.  Take additional 1 tablet in 1 hour, if needed 01/05/14   Rita Ohara, MD  colchicine (COLCRYS) 0.6 MG tablet Take two tablet by mouth at once and then a third tablet one hour later 06/15/14   Lutricia Feil, PA  diphenoxylate-atropine (LOMOTIL) 2.5-0.025 MG per tablet TAKE 1 TO 2 TABLETS BY MOUTH EVERY 6 HOURS AS NEEDED FOR DIARRHEA 12/12/11   Gatha Mayer, MD  fish oil-omega-3 fatty acids 1000 MG capsule Take 4 g by mouth daily.     Historical Provider, MD  furosemide (LASIX) 20 MG tablet TAKE 1 TABLET BY MOUTH EVERY DAY 12/16/13   Rita Ohara, MD  levothyroxine (SYNTHROID, LEVOTHROID) 137 MCG tablet Take 137 mcg by mouth daily.    Historical Provider, MD  losartan-hydrochlorothiazide (HYZAAR) 50-12.5 MG per tablet TAKE 1 TABLET BY MOUTH DAILY. 04/17/14   Rita Ohara, MD  metFORMIN (GLUCOPHAGE) 500 MG tablet Take 500 mg by mouth 2 (two) times daily with a meal.    Historical Provider, MD  modafinil (PROVIGIL) 100 MG tablet Take 100 mg by mouth daily.    Historical Provider, MD  Multiple Vitamin (MULTIVITAMIN) tablet Take 1 tablet by mouth daily.      Historical Provider, MD  omeprazole (PRILOSEC) 20 MG capsule TAKE ONE CAPSULE BY MOUTH EVERY DAY 05/16/14   Rita Ohara, MD  pravastatin (PRAVACHOL) 80 MG tablet TAKE 1 TABLET BY MOUTH EVERY DAY 04/17/14   Rita Ohara, MD  pravastatin (PRAVACHOL) 80 MG tablet TAKE 1 TABLET BY MOUTH EVERY DAY 05/09/14   Rita Ohara, MD  propranolol ER (INDERAL LA) 120 MG 24 hr capsule TAKE ONE CAPSULE BY MOUTH DAILY    Rita Ohara, MD  triamcinolone cream (KENALOG) 0.1 % Apply topically 2 (two) times daily. Use sparingly to affected area 07/08/12   Rita Ohara, MD  TRUEPLUS LANCETS 28G MISC by Does not apply route as needed.      Historical Provider, MD  TRUETEST TEST test strip USE TWICE DAILY TO CHECK BLOOD SUGARS 07/01/11   Rowe Clack, MD   BP 135/88 mmHg  Pulse 81   Temp(Src) 99 F (37.2 C) (Oral)  Resp 16  SpO2 96% Physical Exam  Constitutional: She is oriented to person, place, and time. She appears well-developed and well-nourished. No distress.  HENT:  Head: Normocephalic and atraumatic.  Eyes: Conjunctivae are normal.  Cardiovascular: Normal rate.   Pulmonary/Chest: Effort normal.  Musculoskeletal:       Left knee: She exhibits decreased range of motion, swelling, effusion and erythema. She exhibits no ecchymosis, no deformity, no laceration and no bony tenderness. Tenderness found.  CSM exam intact  Neurological: She is alert and oriented to person, place, and time.  Skin: Skin is warm and dry. No rash noted. There is erythema.  Psychiatric: She has a normal mood and affect. Her behavior is normal.  Nursing note and vitals reviewed.   ED Course  Procedures (including critical care time) Labs Review Labs Reviewed - No data to display  Imaging Review No results found.   MDM   1. Acute gout of left knee, unspecified cause   Offered patient pain management and crutches while at [facility] and she declined both. Suspect this to be a gout flare and given her previous success with colchicine, will provide new Rx for colchicine and advise PCP follow up if symptoms do not improve. I do not suspect this patient to have septic arthritis given that she is currently afebrile, without malaise and currently taking Augmentin for another condition.     Lutricia Feil, [place] 06/15/14 [phone]

## 2014-06-15 NOTE — ED Notes (Signed)
Pt  Reports   Symptoms     Of      painfull redness     And  Swelling          Of  Her  l  Knee         Denied   Any  Injury       She  Actually     Reports  The   Symptoms are  Getting  Better        She  Is  Taking  augmentin for  A  Sinus  Infection that     Is clearing  Up

## 2014-06-20 ENCOUNTER — Ambulatory Visit (INDEPENDENT_AMBULATORY_CARE_PROVIDER_SITE_OTHER): Payer: Medicare Other | Admitting: Medical

## 2014-06-20 ENCOUNTER — Encounter: Payer: Self-pay | Admitting: Medical

## 2014-06-20 VITALS — BP 122/80 | HR 72 | Temp 97.9°F | Resp 14 | Wt 184.0 lb

## 2014-06-20 DIAGNOSIS — R05 Cough: Secondary | ICD-10-CM

## 2014-06-20 DIAGNOSIS — R062 Wheezing: Secondary | ICD-10-CM

## 2014-06-20 DIAGNOSIS — R059 Cough, unspecified: Secondary | ICD-10-CM

## 2014-06-20 MED ORDER — AMOXICILLIN-POT CLAVULANATE 875-125 MG PO TABS: 1.0000 | ORAL_TABLET | Freq: Two times a day (BID) | ORAL | Status: DC

## 2014-06-20 MED ORDER — MOMETASONE FURO-FORMOTEROL FUM 100-5 MCG/ACT IN AERO: 2.0000 | INHALATION_SPRAY | Freq: Two times a day (BID) | RESPIRATORY_TRACT | Status: DC

## 2014-06-20 MED ORDER — ALBUTEROL SULFATE HFA 108 (90 BASE) MCG/ACT IN AERS: 2.0000 | INHALATION_SPRAY | Freq: Four times a day (QID) | RESPIRATORY_TRACT | Status: DC | PRN

## 2014-06-20 NOTE — Progress Notes (Signed)
Subjective: Here for c/o cough.  Was in urgent care 06/12/14 with sinus infection so bad could hardly chew.   At that time was given Augmentin.   Had "rattle" in left lung.  Has 1.5 day of augmentin left.  Sinuses much better, but feels like the lung crackle is worse.  Entire family has had similar cough.   She notes wheezing and crackling.  She notes low grade fever few nights ago, some night sweats.   Cough and crackling and wheezing has kept her up.   No recent xray.   Has MS and feels her immune system is low.  She doesn't get illness like this often, but in the last few months had a lot going on though.  No hx/o asthma, lung disease, nonsmoker.  No chest pain, no leg swelling.  No prior use of inhaler.  No other aggravating or relieving factors. No other complaint.  ROS as in subjective  Past Medical History  Diagnosis Date  . PULMONARY EMBOLISM 11/2006    post op  . DEEP VENOUS THROMBOPHLEBITIS 11/2006    post surg, anticoag x 6 mo  . DIABETES MELLITUS, TYPE II     Dr. Buddy Duty  . GERD   . HYPERLIPIDEMIA   . HYPERTENSION   . NEUROPATHY     related to MS  . HYPOTHYROIDISM     Dr. Buddy Duty  . MENOPAUSAL SYNDROME   . Multiple sclerosis     Dr. Jacqulynn Cadet (in [place])  . SINUS TARSI SYNDROME   . IBS   . TRANSAMINASES, SERUM, ELEVATED     Dr. Carlean Purl, referred to [facility]  . Complication of anesthesia     difficulty waking  . TUBULOVILLOUS ADENOMA, COLON, HX OF 09/21/2008    Annotation: cecum, with high-grade dysplasia original colonoscopy Ganem resected by Dr. Johney Maine post-op leak and complicated course Qualifier: Diagnosis of  By: Carlean Purl MD, Dimas Millin   . Gout   . Fatty liver disease, nonalcoholic 11/28/1243       . PONV (postoperative nausea and vomiting)     needs scop patch  . Wears glasses   . HSV-1 (herpes simplex virus 1) infection     (sores below nose)--very rare  . Chronic diarrhea     (since ileocecal valve removed 2008)    Objective: BP 122/80 mmHg  Pulse 72   Temp(Src) 97.9 F (36.6 C) (Oral)  Resp 14  Wt 184 lb (83.462 kg)  SpO2 96%  General appearance: alert, no distress, WD/WN, coughing a lot HEENT: normocephalic, sclerae anicteric, TMs pearly, nares patent, no discharge or erythema, pharynx with some mild post nasal drainage Oral cavity: MMM, no lesions Neck: supple, no lymphadenopathy, no thyromegaly, no masses Heart: RRR, normal S1, S2, no murmurs Lungs: left mid and upper fields with rales, somewhat decreased breath sounds in general, otheirwse no wheezes, rhonchi Pulses: 2+ symmetric, upper and lower extremities, normal cap refill Ext: no edema   Assessment: Encounter Diagnoses  Name Primary?  . Cough Yes  . Wheezing     Plan: Lung sounds improved after 1 round of albuterol nebulized.  She declines CXR at this time.  Will cover for residual infection, and medication to help with symptoms.    Patient Instructions   Encounter Diagnoses  Name Primary?  . Cough Yes  . Wheezing      Begin Dulera inhaler 1-2 puffs twice daily for inflammation. Rinse your mouth out water after use  Use albuterol inhaler, rescue inhaler, 1-2 puffs every  6 hours as needed for wheezing, cough spells, shortness of breath  use 5 more days of Augmentin antibiotic twice daily   Make sure you are drinking plenty of water  You may use a few more days of the sudafed as needed  If not much improved by later this week, let us know     Call back within 3-5 days to let us know how she is doing.  Call/return sooner if worse or not improving.

## 2014-06-20 NOTE — Patient Instructions (Signed)
Encounter Diagnoses  Name Primary?  . Cough Yes  . Wheezing      Begin Dulera inhaler 1-2 puffs twice daily for inflammation. Rinse your mouth out water after use  Use albuterol inhaler, rescue inhaler, 1-2 puffs every 6 hours as needed for wheezing, cough spells, shortness of breath  use 5 more days of Augmentin antibiotic twice daily   Make sure you are drinking plenty of water  You may use a few more days of the sudafed as needed  If not much improved by later this week, let us know

## 2014-06-22 ENCOUNTER — Other Ambulatory Visit: Payer: Self-pay | Admitting: Family Medicine

## 2014-06-23 DIAGNOSIS — E8840 Mitochondrial metabolism disorder, unspecified: Secondary | ICD-10-CM

## 2014-06-23 HISTORY — DX: Mitochondrial metabolism disorder, unspecified: E88.40

## 2014-07-23 ENCOUNTER — Other Ambulatory Visit: Payer: Self-pay | Admitting: Family Medicine

## 2014-07-24 NOTE — Telephone Encounter (Signed)
Yes. Med check

## 2014-07-24 NOTE — Telephone Encounter (Signed)
Is patient due for appt?

## 2014-08-14 ENCOUNTER — Other Ambulatory Visit: Payer: Self-pay | Admitting: Family Medicine

## 2014-08-24 ENCOUNTER — Encounter: Payer: Self-pay | Admitting: Family Medicine

## 2014-08-24 ENCOUNTER — Ambulatory Visit (INDEPENDENT_AMBULATORY_CARE_PROVIDER_SITE_OTHER): Payer: PPO | Admitting: Family Medicine

## 2014-08-24 VITALS — BP 122/74 | HR 68 | Ht 65.0 in | Wt 177.0 lb

## 2014-08-24 DIAGNOSIS — I10 Essential (primary) hypertension: Secondary | ICD-10-CM | POA: Diagnosis not present

## 2014-08-24 DIAGNOSIS — E039 Hypothyroidism, unspecified: Secondary | ICD-10-CM | POA: Diagnosis not present

## 2014-08-24 DIAGNOSIS — K760 Fatty (change of) liver, not elsewhere classified: Secondary | ICD-10-CM

## 2014-08-24 DIAGNOSIS — E876 Hypokalemia: Secondary | ICD-10-CM

## 2014-08-24 DIAGNOSIS — E782 Mixed hyperlipidemia: Secondary | ICD-10-CM | POA: Diagnosis not present

## 2014-08-24 DIAGNOSIS — E119 Type 2 diabetes mellitus without complications: Secondary | ICD-10-CM | POA: Diagnosis not present

## 2014-08-24 LAB — BASIC METABOLIC PANEL
BUN: 9 mg/dL (ref 6–23)
CO2: 29 meq/L (ref 19–32)
Calcium: 10 mg/dL (ref 8.4–10.5)
Chloride: 100 mEq/L (ref 96–112)
Creat: 0.58 mg/dL (ref 0.50–1.10)
Glucose, Bld: 119 mg/dL — ABNORMAL HIGH (ref 70–99)
POTASSIUM: 3.9 meq/L (ref 3.5–5.3)
Sodium: 139 mEq/L (ref 135–145)

## 2014-08-24 LAB — LIPID PANEL
Cholesterol: 117 mg/dL (ref 0–200)
HDL: 28 mg/dL — AB (ref >=46)
LDL Cholesterol: 53 mg/dL (ref 0–99)
Total CHOL/HDL Ratio: 4.2 Ratio
Triglycerides: 179 mg/dL — ABNORMAL HIGH (ref <150)
VLDL: 36 mg/dL (ref 0–40)

## 2014-08-24 NOTE — Progress Notes (Signed)
Chief Complaint  Patient presents with  . Hypertension    fasting med check.    [facility] found some fibrosis in her liver (had fibroscan, biopsy).  She was put in a study (injection and pill--stage 2 of clinical trial).  They are optimistic about potential improvement.  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  Hypertension follow-up:  Blood pressures elsewhere are 110-120/70's.  Denies dizziness, headaches, chest pain.  Denies side effects of medications. BP's have improved since going to [place] regularly.  Hyperlipidemia follow-up:  Patient is reportedly following a low-fat, low cholesterol diet.  Compliant with medications and denies medication side effects.  She avoids sweets (rare), baked goods.  Has made further lifestyle changes since her last visit.  She has lost weight, and is exercising regularly.  Diabetes and hypothyroidism--are both managed by Dr. Buddy Duty.  She has upcoming appointment later this month, and has been doing very well.  Sugars have been good (hasn't seen spike yet from new medication (study), as she was instructed she might see).  Morning sugars usually 110, was 120 today.  Denies changes in energy/hair/skin/bowels/moods.  +intentional weight loss.  PMH, PSH, SH reviewed Outpatient Encounter Prescriptions as of 08/24/2014  Medication Sig Note  . fish oil-omega-3 fatty acids 1000 MG capsule Take 4 g by mouth daily.  01/05/2014: Taking 2 BID  . levothyroxine (SYNTHROID, LEVOTHROID) 137 MCG tablet Take 137 mcg by mouth daily. 03/08/2012: rx'd by Dr. Buddy Duty  . losartan-hydrochlorothiazide (HYZAAR) 50-12.5 MG per tablet TAKE 1 TABLET BY MOUTH DAILY.   . metFORMIN (GLUCOPHAGE) 500 MG tablet Take 1,000 mg by mouth 2 (two) times daily with a meal.  03/08/2012: rx'd by Dr. Buddy Duty  . Multiple Vitamin (MULTIVITAMIN) tablet Take 1 tablet by mouth daily.     . Potassium 99 MG TABS Take by mouth. 08/24/2014: Received from: [place]  . pravastatin (PRAVACHOL) 80 MG tablet TAKE 1 TABLET BY MOUTH EVERY DAY   . propranolol ER (INDERAL LA) 120 MG 24 hr capsule TAKE ONE CAPSULE BY MOUTH DAILY 01/05/2014: Originally prescribed by neuro; helps with tremor  . STUDY MEDICATION 1 oral and 1 injectable study medication for fatty liver.   . TRUEPLUS LANCETS 28G MISC by Does not apply route as needed.     . TRUETEST TEST test strip USE TWICE DAILY TO CHECK BLOOD SUGARS   . [DISCONTINUED] losartan-hydrochlorothiazide (HYZAAR) 50-12.5 MG per tablet TAKE 1 TABLET BY MOUTH DAILY. 08/24/2014: Received from: [place]  . acyclovir (ZOVIRAX) 400 MG tablet Take 400 mg by mouth 3 (three) times daily as needed.   01/05/2014: Hasn't needed in years (prn cold sores)  . colchicine (COLCRYS) 0.6 MG tablet Take 2 tablets at onset of gout attack.  Take additional 1 tablet in 1 hour, if needed (Patient not taking: Reported on 08/24/2014)   . furosemide (LASIX) 20 MG tablet TAKE 1 TABLET BY MOUTH EVERY DAY (Patient not taking: Reported on 08/24/2014) 08/24/2014: Cut back to prn just a couple of weeks ago (had been taking daily prior).  Only used once in the last week  . omeprazole (PRILOSEC) 20 MG capsule TAKE ONE CAPSULE BY MOUTH EVERY DAY (Patient not taking: Reported on 08/24/2014) 08/24/2014: Hasn't taken it in a week, plans to use prn once she identifies foods that trigger it  . [DISCONTINUED] albuterol (PROVENTIL HFA;VENTOLIN HFA) 108 (90 BASE) MCG/ACT inhaler Inhale 2 puffs into the lungs every 6 (six) hours  as needed for wheezing or shortness of breath.   . [DISCONTINUED] amoxicillin-clavulanate (AUGMENTIN) 875-125 MG per tablet Take 1 tablet by mouth 2 (two) times daily.   . [DISCONTINUED] aspirin 81 MG tablet Take 81 mg by mouth daily.    . [DISCONTINUED] furosemide (LASIX) 20 MG tablet Take by mouth. 08/24/2014: Received from: [place]  . [DISCONTINUED] levothyroxine (SYNTHROID, LEVOTHROID) 137 MCG tablet Take  by mouth. 08/24/2014: Received from: [place]  . [DISCONTINUED] metFORMIN (GLUCOPHAGE) 500 MG tablet Take by mouth. 08/24/2014: Received from: [place]  . [DISCONTINUED] modafinil (PROVIGIL) 100 MG tablet Take 100 mg by mouth daily. 01/05/2014: rx'd by neuro  . [DISCONTINUED] mometasone-formoterol (DULERA) 100-5 MCG/ACT AERO Inhale 2 puffs into the lungs 2 (two) times daily.   . [DISCONTINUED] Multiple Vitamin (MULTIVITAMIN) capsule Take by mouth. 08/24/2014: Received from: [place]  . [DISCONTINUED] Omega-3 1000 MG CAPS Take by mouth. 08/24/2014: Received from: [place]  . [DISCONTINUED] omeprazole (PRILOSEC) 20 MG capsule Take by mouth. 08/24/2014: Received from: [place]  . [DISCONTINUED] pravastatin (PRAVACHOL) 80 MG tablet TAKE 1 TABLET BY MOUTH EVERY DAY   . [DISCONTINUED] propranolol ER (INDERAL LA) 120 MG 24 hr capsule Take by mouth. 08/24/2014: Received from: [place]  . [DISCONTINUED] triamcinolone cream (KENALOG) 0.1 % Apply topically 2 (two) times daily. Use sparingly to affected area (Patient not taking: Reported on 06/20/2014)    No Known Allergies  ROS:  Some ongoing runny nose/congestion (since December). Mucus is clear . No fevers, chills, nausea, vomiting, bowel changes, urinary symptoms.  Denies bleeding, bruising, rashes. No headaches, dizziness, chest pain, palpitations, edema, shortness of breath. Denies depression, anxiety, trouble sleeping.  +13# weight loss since her September visit (intentional)  PHYSICAL EXAM: BP 122/74 mmHg  Pulse 68  Ht $R'5\' 5"'tY$  (1.651 m)  Wt 177 lb (80.287 kg)  BMI 29.45 kg/m2 Well developed, pleasant female in no distress HEENT: PERRL, EOMI, conjunctia and sclera are clear, anicteric. TM's and EAC's are normal.  Nasal mucosa is moderately edematous, with clear-white mucus.  No erythema or purulence. Sinuses are nontender OP is  clear Neck: no lymphadenopathy , thyromegaly or mass Heart: regular rate and rhythm without murmur Lungs: clear bilaterally Abdomen: soft, nontender, no mass Extremities: no edema Skin: no lesions/rash Psych: normal mood, affect, hygiene and grooming Neuro: alert and oriented. Cranial nerves intact, normal gait.  ASSESSMENT/PLAN:  Essential hypertension - well controlled - Plan: Basic metabolic panel  Fatty liver disease, nonalcoholic - elevated transaminases - per Dr. Carlean Purl and [facility], on study meds  Mixed hyperlipidemia - Plan: Lipid panel  Hypothyroidism, unspecified hypothyroidism type - euthyroid, treated/monitored by Dr. Buddy Duty  Hypokalemia - recently noted on labs from [facility].  on supplement--recheck today - Plan: Basic metabolic panel  Type 2 diabetes mellitus without complication - controlled per history.  treated/monitored by Dr. Buddy Duty   b-met only today--has liver tests checked every other week at [place] (she had CBC last month prior to entering study).  Send copies of labs to Dr. Buddy Duty.  Continue your current medications.  We will let you know your results soon.  If the potassium is running on the high side (since you are no longer taking the lasix daily) we will let you know to cut back.  It may not be a bad idea to stay on it if it isn't high, since you still have a daily diuretic on board (HCTZ).  Check with your insurance to see if zostavax (shingles vaccine) is covered at age 60, prior to your next visit.   F/u 6 months for CPE/med check

## 2014-08-24 NOTE — Patient Instructions (Signed)
Continue your current medications.  We will let you know your results soon.  If the potassium is running on the high side (since you are no longer taking the lasix daily) we will let you know to cut back.  It may not be a bad idea to stay on it if it isn't high, since you still have a daily diuretic on board (HCTZ).  Check with your insurance to see if zostavax (shingles vaccine) is covered at age 60, prior to your next visit.

## 2014-09-13 ENCOUNTER — Telehealth: Payer: Self-pay | Admitting: Family Medicine

## 2014-09-13 MED ORDER — AMOXICILLIN 500 MG PO TABS: 1000.0000 mg | ORAL_TABLET | Freq: Two times a day (BID) | ORAL | Status: DC

## 2014-09-13 NOTE — Telephone Encounter (Signed)
Advise pt that rx was sent to her pharmacy (amoxil)

## 2014-09-13 NOTE — Telephone Encounter (Signed)
Patient advised.

## 2014-09-13 NOTE — Telephone Encounter (Signed)
Pt called with update, Pt states congestion and drainage has now turned back to green. Was just seen last week.   She states she has been dealing with this since December, had gotten better but now worse again.  Pt wants antibiotic to [facility]

## 2014-09-27 ENCOUNTER — Telehealth: Payer: Self-pay | Admitting: *Deleted

## 2014-09-27 NOTE — Telephone Encounter (Signed)
Patient called and left message asking for a referral to be put in the system for her to see Dr.Gessner's PA-she is already a patient there but they cannot get her in until late May, states that if we put in a referral then she can be seen sooner. Needs to be seen for dry, course cough that has been going on all winter that has worsened-she states that she has a hx of reflux.

## 2014-09-27 NOTE — Telephone Encounter (Signed)
I recommend she schedule visit here.  I can address her cough and reflux (at her last visit she had cut back on her prilosec).  Likely contributing issues for cough or allergies and postnasal drip, as well as reflux.

## 2014-09-27 NOTE — Telephone Encounter (Signed)
Patient advised and she will call back later on to schedule.

## 2014-10-04 ENCOUNTER — Ambulatory Visit (INDEPENDENT_AMBULATORY_CARE_PROVIDER_SITE_OTHER): Payer: PPO | Admitting: Family Medicine

## 2014-10-04 ENCOUNTER — Encounter: Payer: Self-pay | Admitting: Family Medicine

## 2014-10-04 VITALS — BP 130/80 | HR 60 | Ht 65.0 in | Wt 186.0 lb

## 2014-10-04 DIAGNOSIS — K219 Gastro-esophageal reflux disease without esophagitis: Secondary | ICD-10-CM

## 2014-10-04 DIAGNOSIS — I10 Essential (primary) hypertension: Secondary | ICD-10-CM

## 2014-10-04 NOTE — Patient Instructions (Signed)
Gastroesophageal Reflux Disease, Adult Gastroesophageal reflux disease (GERD) happens when acid from your stomach flows up into the esophagus. When acid comes in contact with the esophagus, the acid causes soreness (inflammation) in the esophagus. Over time, GERD may create small holes (ulcers) in the lining of the esophagus. CAUSES   Increased body weight. This puts pressure on the stomach, making acid rise from the stomach into the esophagus.  Smoking. This increases acid production in the stomach.  Drinking alcohol. This causes decreased pressure in the lower esophageal sphincter (valve or ring of muscle between the esophagus and stomach), allowing acid from the stomach into the esophagus.  Late evening meals and a full stomach. This increases pressure and acid production in the stomach.  A malformed lower esophageal sphincter. Sometimes, no cause is found. SYMPTOMS   Burning pain in the lower part of the mid-chest behind the breastbone and in the mid-stomach area. This may occur twice a week or more often.  Trouble swallowing.  Sore throat.  Dry cough.  Asthma-like symptoms including chest tightness, shortness of breath, or wheezing. DIAGNOSIS  Your caregiver may be able to diagnose GERD based on your symptoms. In some cases, X-rays and other tests may be done to check for complications or to check the condition of your stomach and esophagus. TREATMENT  Your caregiver may recommend over-the-counter or prescription medicines to help decrease acid production. Ask your caregiver before starting or adding any new medicines.  HOME CARE INSTRUCTIONS   Change the factors that you can control. Ask your caregiver for guidance concerning weight loss, quitting smoking, and alcohol consumption.  Avoid foods and drinks that make your symptoms worse, such as:  Caffeine or alcoholic drinks.  Chocolate.  Peppermint or mint flavorings.  Garlic and onions.  Spicy foods.  Citrus fruits,  such as oranges, lemons, or limes.  Tomato-based foods such as sauce, chili, salsa, and pizza.  Fried and fatty foods.  Avoid lying down for the 3 hours prior to your bedtime or prior to taking a nap.  Eat small, frequent meals instead of large meals.  Wear loose-fitting clothing. Do not wear anything tight around your waist that causes pressure on your stomach.  Raise the head of your bed 6 to 8 inches with wood blocks to help you sleep. Extra pillows will not help.  Only take over-the-counter or prescription medicines for pain, discomfort, or fever as directed by your caregiver.  Do not take aspirin, ibuprofen, or other nonsteroidal anti-inflammatory drugs (NSAIDs). SEEK IMMEDIATE MEDICAL CARE IF:   You have pain in your arms, neck, jaw, teeth, or back.  Your pain increases or changes in intensity or duration.  You develop nausea, vomiting, or sweating (diaphoresis).  You develop shortness of breath, or you faint.  Your vomit is green, yellow, black, or looks like coffee grounds or blood.  Your stool is red, bloody, or black. These symptoms could be signs of other problems, such as heart disease, gastric bleeding, or esophageal bleeding. MAKE SURE YOU:   Understand these instructions.  Will watch your condition.  Will get help right away if you are not doing well or get worse. Document Released: 03/19/2005 Document Revised: 09/01/2011 Document Reviewed: 12/27/2010 [facility] Patient Information 2015 [place], [place]. This information is not intended to replace advice given to you by your health care provider. Make sure you discuss any questions you have with your health care provider.   I think it is okay for you to continue the as-needed/preventative use of the  omeprazole.  If it doesn't control your symptoms well for those meals, you can take up to 40mg  prior to dinner.  If you have any recurrent symptoms on the days you don't take it, consider restarting at once daily (of  the 20mg , and increase dose only if needed).

## 2014-10-04 NOTE — Progress Notes (Signed)
Chief Complaint  Patient presents with  . Gastrophageal Reflux    [facility] doctor and her were discussing her reflux and recommended that she her PCP to discuss. Has had some issues with her reflux and cough. Only taking her omeprazole PRN currently.    While at [facility], her doctor asked her about why she is on Omeprazole.  She has had some mouth dryness (possibly related to heat), she had had a sinus infection (that is now resolved).  She had her throat feel burned in the Fall--she could only eat bland things, couldn't drink carbonated beverages or coffee for 1-2 weeks.  She had been taking the omeprazole daily at that time.    She cut back to using the omeprazole just as needed about 2 months ago.  She uses it about twice a week, before she has foods with butter, or spicy foods.  She had only one episode of reflux (where she wakes up in the middle of the night) a few weeks ago--she doesn't believe she had taken the medication that evening, and had butter.    She has had some off/on hoarseness, worse when the furnace is on, and also worse related to PND from sinus infection.    She is here to discuss whether she should continue the omeprazole, and whether she needs to take it daily or continue with prn use.  She denies any dysphagia or chest pain.  She denies any allergy symptoms (only rare sneezing, not recent).  She hasn't had cough recently.  She has some throat clearing if she drinks milk.   She had hypokalemia and Dr. Buddy Duty stopped the HCTZ--he actually changed from Losartan HCT 50/12.5 to just 25mg  of losartan (?if the lower dose was intentional or not).  BP's had been running 110/70 on BP medication prior to the change.  BP's have been fine since the lower dose (lower at home than what we got here). She has just the typical muscle issues from MS, no significant cramping. She remains on the K+ supplement.  She has lab f/u scheduled with Dr. Buddy Duty  [facility], [facility], [facility] reviewed  Outpatient Encounter  Prescriptions as of 10/04/2014  Medication Sig Note  . colchicine (COLCRYS) 0.6 MG tablet Take 2 tablets at onset of gout attack.  Take additional 1 tablet in 1 hour, if needed   . fish oil-omega-3 fatty acids 1000 MG capsule Take 4 g by mouth daily.  01/05/2014: Taking 2 BID  . levothyroxine (SYNTHROID, LEVOTHROID) 137 MCG tablet Take 137 mcg by mouth daily. 03/08/2012: rx'd by Dr. Buddy Duty  . losartan (COZAAR) 25 MG tablet Take 25 mg by mouth daily. 10/04/2014: Taking for only 30 days-DrKerr will be checking her K levels.  . metFORMIN (GLUCOPHAGE) 500 MG tablet Take 1,000 mg by mouth 2 (two) times daily with a meal.  03/08/2012: rx'd by Dr. Buddy Duty  . Multiple Vitamin (MULTIVITAMIN) tablet Take 1 tablet by mouth daily.     . Potassium 99 MG TABS Take by mouth. 08/24/2014: Received from: [place]  . pravastatin (PRAVACHOL) 80 MG tablet TAKE 1 TABLET BY MOUTH EVERY DAY   . propranolol ER (INDERAL LA) 120 MG 24 hr capsule TAKE ONE CAPSULE BY MOUTH DAILY 01/05/2014: Originally prescribed by neuro; helps with tremor  . STUDY MEDICATION 1 oral and 1 injectable study medication for fatty liver.   . TRUEPLUS LANCETS 28G MISC by Does not apply route as needed.     . TRUETEST TEST test strip USE TWICE DAILY  TO CHECK BLOOD SUGARS   . [DISCONTINUED] amoxicillin (AMOXIL) 500 MG tablet Take 2 tablets (1,000 mg total) by mouth 2 (two) times daily.   Marland Kitchen acyclovir (ZOVIRAX) 400 MG tablet Take 400 mg by mouth 3 (three) times daily as needed.   01/05/2014: Hasn't needed in years (prn cold sores)  . losartan-hydrochlorothiazide (HYZAAR) 50-12.5 MG per tablet TAKE 1 TABLET BY MOUTH DAILY. (Patient not taking: Reported on 10/04/2014)   . omeprazole (PRILOSEC) 20 MG capsule TAKE ONE CAPSULE BY MOUTH EVERY DAY (Patient not taking: Reported on 08/24/2014) 08/24/2014: Hasn't taken it in a week, plans to use prn once she identifies foods that trigger it  . [DISCONTINUED] furosemide (LASIX) 20 MG tablet TAKE 1 TABLET BY  MOUTH EVERY DAY (Patient not taking: Reported on 08/24/2014) 08/24/2014: Cut back to prn just a couple of weeks ago (had been taking daily prior).  Only used once in the last week   No Known Allergies  ROS:  No headaches, dizziness, chest pain, shortness of breath, dysphagia, heartburn, bowel changes, URI symptoms.  Sinus symptoms resolved.  See HPI.  PHYSICAL EXAM: BP 130/80 mmHg  Pulse 60  Ht 5\' 5"  (1.651 m)  Wt 186 lb (84.369 kg)  BMI 30.95 kg/m2  Well developed, pleasant female in no distress  HEENT: PERRL, EOMI, conjunctiva clear.  TM's and EAC's normal.  Nasal mucosa was normal on the right, mildly edematous on the left; no purulence. Some septal deviation to the left. OP is clear.  Sinuses nontender Neck: no lymphadenopathy, thyromegaly or mass Heart: regular rate and rhythm Lungs:clear bilaterally Abdomen: soft,nontender, no mass Skin: no rash/lesions Psych: normal mood, affect, hygiene and grooming   ASSESSMENT/PLAN:  Gastroesophageal reflux disease without esophagitis - counseled extensively re: vast sx related to reflux, and risks of untreated reflux vs risks of meds  Essential hypertension - well controlled on current regimen.  f/u as scheduled with Dr. Buddy Duty for repeat labs. continue to monitor BP--if >130-135/80-85, recommend increasing to 50mg    Reviewed diet/behavioral measures to prevent reflux.  Given lack of symptoms, and ability to predict foods which trigger symptoms, okay to continue prn use of PPI. If she has symptoms occuring on other days, go back to taking daily PPI. We reviewed the possible symptoms (wheezing, hoarseness, cough, heartburn, etc).  All questions were answered. 25 minutes visit, more than 1/2 spent counseling.

## 2014-10-30 ENCOUNTER — Other Ambulatory Visit: Payer: Self-pay | Admitting: Family Medicine

## 2014-11-07 ENCOUNTER — Other Ambulatory Visit: Payer: Self-pay | Admitting: Family Medicine

## 2014-12-18 ENCOUNTER — Other Ambulatory Visit: Payer: Self-pay

## 2014-12-20 ENCOUNTER — Encounter: Payer: Self-pay | Admitting: Family Medicine

## 2014-12-20 ENCOUNTER — Ambulatory Visit (INDEPENDENT_AMBULATORY_CARE_PROVIDER_SITE_OTHER): Payer: PPO | Admitting: Family Medicine

## 2014-12-20 VITALS — BP 148/98 | HR 72 | Temp 97.8°F | Ht 66.0 in | Wt 188.0 lb

## 2014-12-20 DIAGNOSIS — I10 Essential (primary) hypertension: Secondary | ICD-10-CM | POA: Diagnosis not present

## 2014-12-20 DIAGNOSIS — R102 Pelvic and perineal pain: Secondary | ICD-10-CM

## 2014-12-20 DIAGNOSIS — R109 Unspecified abdominal pain: Secondary | ICD-10-CM | POA: Diagnosis not present

## 2014-12-20 DIAGNOSIS — N841 Polyp of cervix uteri: Secondary | ICD-10-CM | POA: Diagnosis not present

## 2014-12-20 LAB — POCT URINALYSIS DIPSTICK
Bilirubin, UA: NEGATIVE
Blood, UA: NEGATIVE
Glucose, UA: NEGATIVE
KETONES UA: NEGATIVE
Leukocytes, UA: NEGATIVE
Nitrite, UA: NEGATIVE
PH UA: 6
Protein, UA: NEGATIVE
Spec Grav, UA: 1.025
Urobilinogen, UA: NEGATIVE

## 2014-12-20 NOTE — Patient Instructions (Signed)
We are referring you to GYN for further evaluation of your pelvic pain, and of the cervical polyp/mass noted on exam today.  Your blood pressure was elevated--be sure to continue monitoring it elsewhere, and return if persistently elevated.  Follow up as scheduled for your physical, sooner if needed.

## 2014-12-20 NOTE — Progress Notes (Signed)
Chief Complaint  Patient presents with  . Pelvic pressure    that comes and goes x couple months. No burning with urination, no increase in frequency or urgency. Does state that she has a clear liquid discharge from time to time.    She describes a pressure in her lower abdomen, similar to when she has had a UTI in the past.  The pressure comes and goes throughout the day, ongoing for months.  One day she had a "touch of spotting".  She noted a small dried spot of blood in her panties (the size of her fingernail).  She has also had intermittent watery discharge.  She doesn't notice any relation of discomfort to full or empty bladder, voiding, related to bowels (she has chronic diarrhea).  Lasts about 1-2 hours, then self resolves.  She went to UC once, and was not found to have UTI.  No blood in the stool or urine noted. No fevers or chills.  Last pelvic exam/CPE was 03/2013. She has one scheduled.  No problems at that time, and normal exam.  Pap wasn't done (last done in 2013), but due now. Multiple prior abdominal surgeries (see PSH).  DM--Fasting glucose 143 this morning, which is much higher than normal.  Usually fasting sugars are 110.  Last A1c was "very good", she thinks 6.5.  HTN--She had Benicar HCT temporarily changed to Benicar so that studies could be done to evaluate for hypokalemia (per pt, done by Dr. Buddy Duty).  She is now back on the combination med.  She checks BP at home, and it has been 110-120/70-80. She isn't sure why it is elevated today.  PMH reviewed. Past Surgical History  Procedure Laterality Date  . Laraoscopic cecal polyp resection  11/2006    Right  . Ex lap and ileostomy/hartmanns pouch for anatamotic leak  11/2006  . Ileostomy reversal  04/2007  . Cesarean section      x's 2  . Abdominoplasty  2004  . Thyroidectomy, partial  1990    right  . Colonoscopy      multiple  . Colonoscopy  11/05/2011    Procedure: COLONOSCOPY;  Surgeon: Gatha Mayer, MD;  Location:  [facility];  Service: Endoscopy;  Laterality: N/A;  . Cholecystectomy  2004    lap choli  . Tonsillectomy    . Mass excision Left 07/04/2013    Procedure: LEFT PALM EXCISION MASS;  Surgeon: Tennis Must, MD;  Location: [facility];  Service: Orthopedics;  Laterality: Left;   Outpatient Encounter Prescriptions as of 12/20/2014  Medication Sig Note  . acyclovir (ZOVIRAX) 400 MG tablet Take 400 mg by mouth 3 (three) times daily as needed.   01/05/2014: Hasn't needed in years (prn cold sores)  . colchicine (COLCRYS) 0.6 MG tablet Take 2 tablets at onset of gout attack.  Take additional 1 tablet in 1 hour, if needed   . fish oil-omega-3 fatty acids 1000 MG capsule Take 4 g by mouth daily.  01/05/2014: Taking 2 BID  . levothyroxine (SYNTHROID, LEVOTHROID) 137 MCG tablet Take 137 mcg by mouth daily. 03/08/2012: rx'd by Dr. Buddy Duty  . losartan-hydrochlorothiazide (HYZAAR) 50-12.5 MG per tablet TAKE 1 TABLET BY MOUTH DAILY.   . metFORMIN (GLUCOPHAGE) 500 MG tablet Take 1,000 mg by mouth 2 (two) times daily with a meal.  03/08/2012: rx'd by Dr. Buddy Duty  . Multiple Vitamin (MULTIVITAMIN) tablet Take 1 tablet by mouth daily.     Marland Kitchen omeprazole (PRILOSEC) 20 MG capsule TAKE ONE  CAPSULE BY MOUTH EVERY DAY   . Potassium 99 MG TABS Take by mouth. 08/24/2014: Received from: [place]  . pravastatin (PRAVACHOL) 80 MG tablet TAKE 1 TABLET BY MOUTH EVERY DAY   . propranolol ER (INDERAL LA) 120 MG 24 hr capsule TAKE ONE CAPSULE BY MOUTH DAILY 01/05/2014: Originally prescribed by neuro; helps with tremor  . STUDY MEDICATION 1 oral and 1 injectable study medication for fatty liver.   . TRUEPLUS LANCETS 28G MISC by Does not apply route as needed.     . TRUETEST TEST test strip USE TWICE DAILY TO CHECK BLOOD SUGARS   . [DISCONTINUED] losartan (COZAAR) 25 MG tablet Take 25 mg by mouth daily. 10/04/2014: Taking for only 30 days-DrKerr will be checking her K levels.  . [DISCONTINUED] pravastatin  (PRAVACHOL) 80 MG tablet TAKE 1 TABLET BY MOUTH EVERY DAY   . [DISCONTINUED] propranolol ER (INDERAL LA) 120 MG 24 hr capsule TAKE ONE CAPSULE BY MOUTH DAILY    No facility-administered encounter medications on file as of 12/20/2014.   No Known Allergies  ROS:  No fever, chills, dizziness, chest pain. No dysuria, hematuria, bowel changes (chronic diarrhea). No nausea, vomiting.  No URI symptoms, chest pain, shortness of breath.  See HPI.  PHYSICAL EXAM: BP 148/98 mmHg  Pulse 72  Temp(Src) 97.8 F (36.6 C) (Tympanic)  Ht 5\' 6"  (1.676 m)  Wt 188 lb (85.276 kg)  BMI 30.36 kg/m2 Well developed, pleasant female in no distress Heart: regular rate and rhythm Lungs: clear bilaterally Back: no CVA tenderness Abdomen:  extensive WHSS scars across lower abdomen, RLQ. Abdomen is soft, nontender, no mass, normal bowel sounds. GU exam: 2.5 x 0.5cm polyp noted extending from cervical os.  Slightly friable appearing, but not bleeding.  Cervix otherwise appears normal.  No vaginal discharge noted. No pelvic (uterine or adnexal) mass is appreciated, nontender. External genitalia is normal, with some atrophic changes   Urine dip:  Normal.  ASSESSMENT/PLAN:  Pelvic pain in female - Plan: Ambulatory referral to Gynecology  Cervical polyp - Plan: Ambulatory referral to Gynecology  Abdominal pain, unspecified abdominal location - Plan: POCT Urinalysis Dipstick  Essential hypertension - BP elevated in office, normal when routinely checked at home.  continue monitoring; f/u if persistently elevated  DM--controlled.   Refer to GYN for evaluation Previously saw Dr. Ronita Hipps, who stopped taking her insurance (Medicare). Ddx discussed in detail. Scar tissue may be contributing. I'm not sure that the large polyp is related to her discomfort.  May need pelvic u/s. All questions answered. Reassured no UTI.

## 2014-12-21 ENCOUNTER — Other Ambulatory Visit: Payer: Self-pay | Admitting: Family Medicine

## 2014-12-26 ENCOUNTER — Other Ambulatory Visit: Payer: Self-pay | Admitting: Family Medicine

## 2014-12-28 ENCOUNTER — Telehealth: Payer: Self-pay | Admitting: Internal Medicine

## 2014-12-28 MED ORDER — LOSARTAN POTASSIUM-HCTZ 50-12.5 MG PO TABS: 1.0000 | ORAL_TABLET | Freq: Every day | ORAL | Status: DC

## 2014-12-28 NOTE — Telephone Encounter (Signed)
Pt notified and will keep her appt. Sent med to pharmacy

## 2014-12-28 NOTE — Telephone Encounter (Signed)
Yes, please keep it as a physical (just minus the GYN part).  We discuss other wellness items, including immunizations (per our records, she is due for Tdap, unless she has had it elsewhere--have her check and bring dates with her if she did so we can enter into epic).  Ok to refill the losartan HCTZ

## 2014-12-28 NOTE — Telephone Encounter (Signed)
Pt called stating that she saw Dr. Ronita Hipps OBGYN yesterday and has a breast, mammogram, and pap and they removed her polyp as well. She wants to know does she need to still come for her cpe on 9/12. I advised her that you probably still did that there is other things done in a physical and she was also doing a med check as well but she just wanted me to check with you..  Pt also needs a refill on losartan-hctz that i will send in as a 90 day supply to [facility]

## 2015-01-11 ENCOUNTER — Ambulatory Visit: Payer: PPO | Admitting: Gynecology

## 2015-02-01 ENCOUNTER — Emergency Department (HOSPITAL_COMMUNITY): Payer: PPO

## 2015-02-01 ENCOUNTER — Observation Stay (HOSPITAL_COMMUNITY)
Admission: EM | Admit: 2015-02-01 | Discharge: 2015-02-02 | Disposition: A | Discharge status: Home / Self Care | Payer: PPO | Attending: Internal Medicine | Admitting: Internal Medicine

## 2015-02-01 ENCOUNTER — Encounter (HOSPITAL_COMMUNITY): Payer: Self-pay

## 2015-02-01 DIAGNOSIS — K219 Gastro-esophageal reflux disease without esophagitis: Secondary | ICD-10-CM | POA: Diagnosis not present

## 2015-02-01 DIAGNOSIS — E119 Type 2 diabetes mellitus without complications: Secondary | ICD-10-CM

## 2015-02-01 DIAGNOSIS — R200 Anesthesia of skin: Secondary | ICD-10-CM | POA: Diagnosis not present

## 2015-02-01 DIAGNOSIS — E039 Hypothyroidism, unspecified: Secondary | ICD-10-CM | POA: Diagnosis not present

## 2015-02-01 DIAGNOSIS — R29810 Facial weakness: Secondary | ICD-10-CM | POA: Diagnosis not present

## 2015-02-01 DIAGNOSIS — G459 Transient cerebral ischemic attack, unspecified: Secondary | ICD-10-CM | POA: Diagnosis not present

## 2015-02-01 DIAGNOSIS — G35 Multiple sclerosis: Secondary | ICD-10-CM

## 2015-02-01 DIAGNOSIS — Z86718 Personal history of other venous thrombosis and embolism: Secondary | ICD-10-CM | POA: Diagnosis not present

## 2015-02-01 DIAGNOSIS — G458 Other transient cerebral ischemic attacks and related syndromes: Secondary | ICD-10-CM

## 2015-02-01 DIAGNOSIS — I10 Essential (primary) hypertension: Secondary | ICD-10-CM

## 2015-02-01 DIAGNOSIS — Z79899 Other long term (current) drug therapy: Secondary | ICD-10-CM | POA: Insufficient documentation

## 2015-02-01 DIAGNOSIS — M25579 Pain in unspecified ankle and joints of unspecified foot: Secondary | ICD-10-CM | POA: Diagnosis not present

## 2015-02-01 DIAGNOSIS — E1140 Type 2 diabetes mellitus with diabetic neuropathy, unspecified: Secondary | ICD-10-CM | POA: Insufficient documentation

## 2015-02-01 DIAGNOSIS — Z6830 Body mass index (BMI) 30.0-30.9, adult: Secondary | ICD-10-CM | POA: Diagnosis not present

## 2015-02-01 DIAGNOSIS — E669 Obesity, unspecified: Secondary | ICD-10-CM | POA: Diagnosis not present

## 2015-02-01 DIAGNOSIS — Z86711 Personal history of pulmonary embolism: Secondary | ICD-10-CM | POA: Insufficient documentation

## 2015-02-01 DIAGNOSIS — K760 Fatty (change of) liver, not elsewhere classified: Secondary | ICD-10-CM | POA: Diagnosis not present

## 2015-02-01 DIAGNOSIS — E782 Mixed hyperlipidemia: Secondary | ICD-10-CM | POA: Diagnosis not present

## 2015-02-01 DIAGNOSIS — M109 Gout, unspecified: Secondary | ICD-10-CM | POA: Insufficient documentation

## 2015-02-01 DIAGNOSIS — K580 Irritable bowel syndrome with diarrhea: Secondary | ICD-10-CM | POA: Insufficient documentation

## 2015-02-01 LAB — CBC
HCT: 40.7 % (ref 36.0–46.0)
Hemoglobin: 13.5 g/dL (ref 12.0–15.0)
MCH: 29.2 pg (ref 26.0–34.0)
MCHC: 33.2 g/dL (ref 30.0–36.0)
MCV: 87.9 fL (ref 78.0–100.0)
PLATELETS: 245 10*3/uL (ref 150–400)
RBC: 4.63 MIL/uL (ref 3.87–5.11)
RDW: 13 % (ref 11.5–15.5)
WBC: 7.5 10*3/uL (ref 4.0–10.5)

## 2015-02-01 LAB — COMPREHENSIVE METABOLIC PANEL
ALT: 53 U/L (ref 14–54)
AST: 42 U/L — AB (ref 15–41)
Albumin: 4.1 g/dL (ref 3.5–5.0)
Alkaline Phosphatase: 76 U/L (ref 38–126)
Anion gap: 10 (ref 5–15)
BUN: 15 mg/dL (ref 6–20)
CALCIUM: 9.7 mg/dL (ref 8.9–10.3)
CO2: 26 mmol/L (ref 22–32)
CREATININE: 0.79 mg/dL (ref 0.44–1.00)
Chloride: 102 mmol/L (ref 101–111)
GFR calc Af Amer: >60 mL/min (ref >60)
Glucose, Bld: 144 mg/dL — ABNORMAL HIGH (ref 65–99)
Potassium: 3.7 mmol/L (ref 3.5–5.1)
Sodium: 138 mmol/L (ref 135–145)
Total Bilirubin: 1 mg/dL (ref 0.3–1.2)
Total Protein: 7.7 g/dL (ref 6.5–8.1)

## 2015-02-01 LAB — I-STAT TROPONIN, ED: Troponin i, poc: 0 ng/mL (ref 0.00–0.08)

## 2015-02-01 LAB — DIFFERENTIAL
BASOS PCT: 0 % (ref 0–1)
Basophils Absolute: 0 10*3/uL (ref 0.0–0.1)
Eosinophils Absolute: 0.2 10*3/uL (ref 0.0–0.7)
Eosinophils Relative: 2 % (ref 0–5)
LYMPHS PCT: 30 % (ref 12–46)
Lymphs Abs: 2.3 10*3/uL (ref 0.7–4.0)
Monocytes Absolute: 0.7 10*3/uL (ref 0.1–1.0)
Monocytes Relative: 9 % (ref 3–12)
NEUTROS PCT: 59 % (ref 43–77)
Neutro Abs: 4.4 10*3/uL (ref 1.7–7.7)

## 2015-02-01 LAB — ETHANOL: Alcohol, Ethyl (B): <5 mg/dL (ref <5)

## 2015-02-01 LAB — PROTIME-INR
INR: 1.02 (ref 0.00–1.49)
Prothrombin Time: 13.6 seconds (ref 11.6–15.2)

## 2015-02-01 LAB — APTT: APTT: 28 s (ref 24–37)

## 2015-02-01 LAB — LACTIC ACID, PLASMA: LACTIC ACID, VENOUS: 1.9 mmol/L (ref 0.5–2.0)

## 2015-02-01 LAB — AMMONIA: Ammonia: 16 umol/L (ref 9–35)

## 2015-02-01 MED ORDER — LORAZEPAM 2 MG/ML IJ SOLN
1.0000 mg | Freq: Once | INTRAMUSCULAR | Status: AC
  Administered 2015-02-01: 1 mg via INTRAVENOUS
  Filled 2015-02-01: qty 1

## 2015-02-01 MED ORDER — PROPRANOLOL HCL ER 120 MG PO CP24
120.0000 mg | ORAL_CAPSULE | Freq: Every day | ORAL | Status: DC
  Administered 2015-02-02: 120 mg via ORAL
  Filled 2015-02-01: qty 1

## 2015-02-01 MED ORDER — LEVOTHYROXINE SODIUM 137 MCG PO TABS
137.0000 ug | ORAL_TABLET | Freq: Every day | ORAL | Status: DC
  Administered 2015-02-02: 137 ug via ORAL
  Filled 2015-02-01 (×2): qty 1

## 2015-02-01 MED ORDER — GADOBENATE DIMEGLUMINE 529 MG/ML IV SOLN
17.0000 mL | Freq: Once | INTRAVENOUS | Status: AC | PRN
  Administered 2015-02-01: 17 mL via INTRAVENOUS

## 2015-02-01 MED ORDER — LOSARTAN POTASSIUM 25 MG PO TABS
25.0000 mg | ORAL_TABLET | Freq: Every day | ORAL | Status: DC
  Administered 2015-02-02: 25 mg via ORAL
  Filled 2015-02-01: qty 1

## 2015-02-01 MED ORDER — POTASSIUM 99 MG PO TABS: 1.0000 | ORAL_TABLET | Freq: Every day | ORAL | Status: DC

## 2015-02-01 MED ORDER — ENOXAPARIN SODIUM 40 MG/0.4ML ~~LOC~~ SOLN
40.0000 mg | SUBCUTANEOUS | Status: DC
Start: 2015-02-02 — End: 2015-02-02
  Administered 2015-02-02: 40 mg via SUBCUTANEOUS
  Filled 2015-02-01: qty 0.4

## 2015-02-01 MED ORDER — METFORMIN HCL 500 MG PO TABS
1000.0000 mg | ORAL_TABLET | Freq: Two times a day (BID) | ORAL | Status: DC
Start: 2015-02-02 — End: 2015-02-02
  Administered 2015-02-02: 1000 mg via ORAL
  Filled 2015-02-01: qty 2

## 2015-02-01 MED ORDER — ASPIRIN 325 MG PO TABS
325.0000 mg | ORAL_TABLET | Freq: Once | ORAL | Status: AC
  Administered 2015-02-01: 325 mg via ORAL
  Filled 2015-02-01: qty 1

## 2015-02-01 MED ORDER — LOSARTAN POTASSIUM-HCTZ 50-12.5 MG PO TABS: 1.0000 | ORAL_TABLET | Freq: Every day | ORAL | Status: DC

## 2015-02-01 MED ORDER — PRAVASTATIN SODIUM 80 MG PO TABS
80.0000 mg | ORAL_TABLET | Freq: Every day | ORAL | Status: DC
Start: 2015-02-02 — End: 2015-02-02
  Filled 2015-02-01: qty 1

## 2015-02-01 MED ORDER — STROKE: EARLY STAGES OF RECOVERY BOOK
Freq: Once | Status: DC
  Filled 2015-02-01: qty 1

## 2015-02-01 MED ORDER — PANTOPRAZOLE SODIUM 40 MG PO TBEC: 40.0000 mg | DELAYED_RELEASE_TABLET | Freq: Every day | ORAL | Status: DC

## 2015-02-01 NOTE — ED Notes (Signed)
Patient to MRI via stretcher.

## 2015-02-01 NOTE — ED Notes (Signed)
Attempted to call report. Secretary reports nurse is currently unavailable. Will call back in a few.

## 2015-02-01 NOTE — H&P (Signed)
[facility] History and Physical  [name] WIO:[id number] DOB: 1955/09/11 DOA: 02/01/2015  Referring physician: EDP PCP: Vikki Ports, MD   Chief Complaint: Left facial droop   HPI: [name] is a 59 y.o. female with h/o MS.  Patient presents to the ED with onset of L facial weakness and numbness onset between 6:30-7pm.  Occurred while exercising, left face went numb, husband noted it was drooping.  No visual changes, extremity weakness, diplopia.  Steadily improved since arrival to ED now back to baseline.  Review of Systems: Systems reviewed.  As above, otherwise negative  Past Medical History  Diagnosis Date  . PULMONARY EMBOLISM 11/2006    post op  . DEEP VENOUS THROMBOPHLEBITIS 11/2006    post surg, anticoag x 6 mo  . DIABETES MELLITUS, TYPE II     Dr. Buddy Duty  . GERD   . HYPERLIPIDEMIA   . HYPERTENSION   . NEUROPATHY     related to MS  . HYPOTHYROIDISM     Dr. Buddy Duty  . MENOPAUSAL SYNDROME   . Multiple sclerosis     Dr. Jacqulynn Cadet (in [place])  . SINUS TARSI SYNDROME   . IBS   . TRANSAMINASES, SERUM, ELEVATED     Dr. Carlean Purl, referred to [facility]- [facility]  . Complication of anesthesia     difficulty waking  . TUBULOVILLOUS ADENOMA, COLON, HX OF 09/21/2008    Annotation: cecum, with high-grade dysplasia original colonoscopy Gan[place] resected by Dr. Johney Maine post-op leak and complicated course Qualifier: Diagnosis of  By: Carlean Purl MD, Dimas Millin   . Gout   . Fatty liver disease, nonalcoholic 09/22/6832    fibrosis noted 2016; involved in study through [place]  . PONV (postoperative nausea and vomiting)     needs scop patch  . Wears glasses   . HSV-1 (herpes simplex virus 1) infection     (sores below nose)--very rare  . Chronic diarrhea     (since ileocecal valve removed 2008)   Past Surgical History  Procedure Laterality Date  . Laraoscopic cecal polyp resection  11/2006    Right  . Ex lap and ileostomy/hartmanns pouch for anatamotic leak  11/2006  . Ileostomy  reversal  04/2007  . Cesarean section      x's 2  . Abdominoplasty  2004  . Thyroidectomy, partial  1990    right  . Colonoscopy      multiple  . Colonoscopy  11/05/2011    Procedure: COLONOSCOPY;  Surgeon: Gatha Mayer, MD;  Location: [facility];  Service: Endoscopy;  Laterality: N/A;  . Cholecystectomy  2004    lap choli  . Tonsillectomy    . Mass excision Left 07/04/2013    Procedure: LEFT PALM EXCISION MASS;  Surgeon: Tennis Must, MD;  Location: [facility];  Service: Orthopedics;  Laterality: Left;   Social History:  reports that she has never smoked. She has never used smokeless tobacco. She reports that she does not drink alcohol or use illicit drugs.  No Known Allergies  Family History  Problem Relation Age of Onset  . Breast cancer Mother 59  . Colon cancer Mother 28    cancerous polyp removed age 20  . Pulmonary embolism Mother     on warfarin x 30 years  . Deep vein thrombosis Mother     multiple/recurrent  . Diabetes Maternal Grandmother   . Diabetes Paternal Grandmother   . Stomach cancer Father   . Esophageal cancer Neg Hx   .  Rectal cancer Neg Hx      Prior to Admission medications   Medication Sig Start Date End Date Taking? Authorizing Provider  fish oil-omega-3 fatty acids 1000 MG capsule Take 4 g by mouth daily.    Yes Historical Provider, MD  levothyroxine (SYNTHROID, LEVOTHROID) 137 MCG tablet Take 137 mcg by mouth daily.   Yes Historical Provider, MD  losartan (COZAAR) 25 MG tablet Take 25 mg by mouth daily. 12/01/14  Yes Historical Provider, MD  losartan-hydrochlorothiazide (HYZAAR) 50-12.5 MG per tablet Take 1 tablet by mouth daily. 12/28/14  Yes Rita Ohara, MD  metFORMIN (GLUCOPHAGE) 500 MG tablet Take 1,000 mg by mouth 2 (two) times daily with a meal.    Yes Historical Provider, MD  Multiple Vitamin (MULTIVITAMIN) tablet Take 1 tablet by mouth daily.     Yes Historical Provider, MD  omeprazole (PRILOSEC) 20 MG capsule TAKE ONE  CAPSULE BY MOUTH EVERY DAY 11/08/14  Yes Rita Ohara, MD  Potassium 99 MG TABS Take 1 tablet by mouth daily.    Yes Historical Provider, MD  pravastatin (PRAVACHOL) 80 MG tablet TAKE 1 TABLET BY MOUTH EVERY DAY 04/17/14  Yes Rita Ohara, MD  propranolol ER (INDERAL LA) 120 MG 24 hr capsule TAKE ONE CAPSULE BY MOUTH DAILY   Yes Rita Ohara, MD  STUDY MEDICATION 1 oral and 1 injectable study medication for fatty liver.   Yes Historical Provider, MD  TRUEPLUS LANCETS 28G MISC by Does not apply route as needed.      Historical Provider, MD  TRUETEST TEST test strip USE TWICE DAILY TO CHECK BLOOD SUGARS 07/01/11   Rowe Clack, MD   Physical Exam: Filed Vitals:   02/01/15 2153  BP: 167/91  Pulse: 79  Temp:   Resp: 16    BP 167/91 mmHg  Pulse 79  Temp(Src) 98.3 F (36.8 C) (Oral)  Resp 16  SpO2 98%  General Appearance:    Alert, oriented, no distress, appears stated age  Head:    Normocephalic, atraumatic  Eyes:    PERRL, EOMI, sclera non-icteric        Nose:   Nares without drainage or epistaxis. Mucosa, turbinates normal  Throat:   Moist mucous membranes. Oropharynx without erythema or exudate.  Neck:   Supple. No carotid bruits.  No thyromegaly.  No lymphadenopathy.   Back:     No CVA tenderness, no spinal tenderness  Lungs:     Clear to auscultation bilaterally, without wheezes, rhonchi or rales  Chest wall:    No tenderness to palpitation  Heart:    Regular rate and rhythm without murmurs, gallops, rubs  Abdomen:     Soft, non-tender, nondistended, normal bowel sounds, no organomegaly  Genitalia:    deferred  Rectal:    deferred  Extremities:   No clubbing, cyanosis or edema.  Pulses:   2+ and symmetric all extremities  Skin:   Skin color, texture, turgor normal, no rashes or lesions  Lymph nodes:   Cervical, supraclavicular, and axillary nodes normal  Neurologic:   CNII-XII intact. Normal strength, sensation and reflexes      throughout    Labs on Admission:  Basic  Metabolic Panel:  Recent Labs Lab 02/01/15 2107  NA 138  K 3.7  CL 102  CO2 26  GLUCOSE 144*  BUN 15  CREATININE 0.79  CALCIUM 9.7   Liver Function Tests:  Recent Labs Lab 02/01/15 2107  AST 42*  ALT 53  ALKPHOS 76  BILITOT 1.0  PROT 7.7  ALBUMIN 4.1   No results for input(s): LIPASE, AMYLASE in the last 168 hours.  Recent Labs Lab 02/01/15 2107  AMMONIA 16   CBC:  Recent Labs Lab 02/01/15 2107  WBC 7.5  NEUTROABS 4.4  HGB 13.5  HCT 40.7  MCV 87.9  PLT 245   Cardiac Enzymes: No results for input(s): CKTOTAL, CKMB, CKMBINDEX, TROPONINI in the last 168 hours.  BNP (last 3 results) No results for input(s): PROBNP in the last 8760 hours. CBG: No results for input(s): GLUCAP in the last 168 hours.  Radiological Exams on Admission: Ct Head Wo Contrast  02/01/2015   CLINICAL DATA:  Initial evaluation for acute left-sided facial numbness and drooping.  EXAM: CT HEAD WITHOUT CONTRAST  TECHNIQUE: Contiguous axial images were obtained from the base of the skull through the vertex without intravenous contrast.  COMPARISON:  Prior MRI from 04/03/2014.  FINDINGS: Cerebral volume within normal limits for patient age. Patchy hypodensity within the periventricular and deep white matter both cerebral hemispheres present, similar from previous MRI.  No acute large vessel territory infarct. No intracranial hemorrhage. No mass lesion, mass effect, or midline shift. No hydrocephalus.  Scalp soft tissues within normal limits. Calvarium intact. Paranasal sinuses and mastoid air cells are clear.  No acute abnormality about the orbits.  IMPRESSION: 1. No acute intracranial process. 2. Moderate chronic white matter disease, similar to previous MRI   Electronically Signed   By: Jeannine Boga M.D.   On: 02/01/2015 21:51    EKG: Independently reviewed.  Assessment/Plan Principal Problem:   TIA (transient ischemic attack) Active Problems:   DM2 (diabetes mellitus, type 2)    Multiple sclerosis   Essential hypertension   Mixed hyperlipidemia   1. TIA - 1. TIA work up pathway 2. MRI/MRA read pending 3. 2d echo 4. Carotid dopplers 5. A1C and lipid panel 6. Anti platelet agent per neurology 7. Tele monitor 2. DM2 - 1. Continue home meds 2. CBG checks AC/HS 3. MS - 1. Continue study drug that patient is taking 4. HTN - 1. Continue home meds 5. HLD - 1. Continue statin    Code Status: Full  Family Communication: Husband at bedside Disposition Plan: Admit to obs   Time spent: 70 min  Jupiter Boys M. [facility] Pager [phone]  If 7AM-7PM, please contact the day team taking care of the patient [email or web address] Password [facility] 02/01/2015, 11:31 PM

## 2015-02-01 NOTE — ED Provider Notes (Signed)
CSN: [id number]     Arrival date & time 02/01/15  2042 History   First MD Initiated Contact with Patient 02/01/15 2053     Chief Complaint  Patient presents with  . Numbness     (Consider location/radiation/quality/duration/timing/severity/associated sxs/prior Treatment) Patient is a 59 y.o. female presenting with neurologic complaint.  Neurologic Problem This is a new problem. The current episode started 1 to 2 hours ago. The problem occurs constantly. The problem has been gradually improving. Pertinent negatives include no chest pain, no headaches and no shortness of breath. Nothing aggravates the symptoms. Nothing relieves the symptoms. She has tried nothing for the symptoms. The treatment provided no relief.    Past Medical History  Diagnosis Date  . PULMONARY EMBOLISM 11/2006    post op  . DEEP VENOUS THROMBOPHLEBITIS 11/2006    post surg, anticoag x 6 mo  . DIABETES MELLITUS, TYPE II     Dr. Buddy Duty  . GERD   . HYPERLIPIDEMIA   . HYPERTENSION   . NEUROPATHY     related to MS  . HYPOTHYROIDISM     Dr. Buddy Duty  . MENOPAUSAL SYNDROME   . Multiple sclerosis     Dr. Jacqulynn Cadet (in [place])  . SINUS TARSI SYNDROME   . IBS   . TRANSAMINASES, SERUM, ELEVATED     Dr. Carlean Purl, referred to [facility]- [facility]  . Complication of anesthesia     difficulty waking  . TUBULOVILLOUS ADENOMA, COLON, HX OF 09/21/2008    Annotation: cecum, with high-grade dysplasia original colonoscopy [place] resected by Dr. Johney Maine post-op leak and complicated course Qualifier: Diagnosis of  By: Carlean Purl MD, Dimas Millin   . Gout   . Fatty liver disease, nonalcoholic 11/23/9526    fibrosis noted 2016; involved in study through [place]  . PONV (postoperative nausea and vomiting)     needs scop patch  . Wears glasses   . HSV-1 (herpes simplex virus 1) infection     (sores below nose)--very rare  . Chronic diarrhea     (since ileocecal valve removed 2008)   Past Surgical History  Procedure Laterality Date  .  Laraoscopic cecal polyp resection  11/2006    Right  . Ex lap and ileostomy/hartmanns pouch for anatamotic leak  11/2006  . Ileostomy reversal  04/2007  . Cesarean section      x's 2  . Abdominoplasty  2004  . Thyroidectomy, partial  1990    right  . Colonoscopy      multiple  . Colonoscopy  11/05/2011    Procedure: COLONOSCOPY;  Surgeon: Gatha Mayer, MD;  Location: [facility];  Service: Endoscopy;  Laterality: N/A;  . Cholecystectomy  2004    lap choli  . Tonsillectomy    . Mass excision Left 07/04/2013    Procedure: LEFT PALM EXCISION MASS;  Surgeon: Tennis Must, MD;  Location: [facility];  Service: Orthopedics;  Laterality: Left;   Family History  Problem Relation Age of Onset  . Breast cancer Mother 80  . Colon cancer Mother 28    cancerous polyp removed age 33  . Pulmonary embolism Mother     on warfarin x 30 years  . Deep vein thrombosis Mother     multiple/recurrent  . Diabetes Maternal Grandmother   . Diabetes Paternal Grandmother   . Stomach cancer Father   . Esophageal cancer Neg Hx   . Rectal cancer Neg Hx    Social History  Substance Use Topics  .  Smoking status: Never Smoker   . Smokeless tobacco: Never Used  . Alcohol Use: No   OB History    Gravida Para Term Preterm AB TAB SAB Ectopic Multiple Living   3 3        3      Review of Systems  Constitutional: Negative for fever and chills.  HENT: Negative for congestion and drooling.   Eyes: Negative for photophobia and pain.  Respiratory: Negative for cough, choking and shortness of breath.   Cardiovascular: Negative for chest pain.  Gastrointestinal: Negative for nausea and vomiting.  Musculoskeletal: Negative for myalgias and back pain.  Skin: Negative for pallor and wound.  Neurological: Positive for weakness and numbness. Negative for dizziness and headaches.  All other systems reviewed and are negative.     Allergies  Review of patient's allergies indicates no known  allergies.  Home Medications   Prior to Admission medications   Medication Sig Start Date End Date Taking? Authorizing Provider  fish oil-omega-3 fatty acids 1000 MG capsule Take 4 g by mouth daily.    Yes Historical Provider, MD  levothyroxine (SYNTHROID, LEVOTHROID) 137 MCG tablet Take 137 mcg by mouth daily.   Yes Historical Provider, MD  losartan (COZAAR) 25 MG tablet Take 25 mg by mouth daily. 12/01/14  Yes Historical Provider, MD  losartan-hydrochlorothiazide (HYZAAR) 50-12.5 MG per tablet Take 1 tablet by mouth daily. 12/28/14  Yes Rita Ohara, MD  metFORMIN (GLUCOPHAGE) 500 MG tablet Take 1,000 mg by mouth 2 (two) times daily with a meal.    Yes Historical Provider, MD  Multiple Vitamin (MULTIVITAMIN) tablet Take 1 tablet by mouth daily.     Yes Historical Provider, MD  omeprazole (PRILOSEC) 20 MG capsule TAKE ONE CAPSULE BY MOUTH EVERY DAY 11/08/14  Yes Rita Ohara, MD  Potassium 99 MG TABS Take 1 tablet by mouth daily.    Yes Historical Provider, MD  pravastatin (PRAVACHOL) 80 MG tablet TAKE 1 TABLET BY MOUTH EVERY DAY 04/17/14  Yes Rita Ohara, MD  propranolol ER (INDERAL LA) 120 MG 24 hr capsule TAKE ONE CAPSULE BY MOUTH DAILY   Yes Rita Ohara, MD  STUDY MEDICATION 1 oral and 1 injectable study medication for fatty liver.   Yes Historical Provider, MD  colchicine (COLCRYS) 0.6 MG tablet Take 2 tablets at onset of gout attack.  Take additional 1 tablet in 1 hour, if needed Patient not taking: Reported on 02/01/2015 01/05/14   Rita Ohara, MD  TRUEPLUS LANCETS 28G MISC by Does not apply route as needed.      Historical Provider, MD  TRUETEST TEST test strip USE TWICE DAILY TO CHECK BLOOD SUGARS 07/01/11   Rowe Clack, MD   BP 167/91 mmHg  Pulse 79  Temp(Src) 98.3 F (36.8 C) (Oral)  Resp 16  SpO2 98% Physical Exam  Constitutional: She is oriented to person, place, and time. She appears well-developed and well-nourished.  HENT:  Head: Normocephalic and atraumatic.  Eyes: Conjunctivae  and EOM are normal. Right eye exhibits no discharge. Left eye exhibits no discharge.  Cardiovascular: Normal rate and regular rhythm.   Pulmonary/Chest: Effort normal and breath sounds normal. No respiratory distress.  Abdominal: Soft. She exhibits no distension. There is no tenderness. There is no rebound.  Musculoskeletal: Normal range of motion. She exhibits no edema or tenderness.  Neurological: She is alert and oriented to person, place, and time.  No altered mental status, able to give full seemingly accurate history.  Face is assymmetric with L  facial droop and decreased sensation to the same, EOM's intact, pupils equal and reactive, vision intact, tongue and uvula midline without deviation Upper and Lower extremity motor 5/5, intact pain perception in distal extremities, 2+ reflexes in biceps, patella and achilles tendons. Finger to nose normal.   Skin: Skin is warm and dry.  Nursing note and vitals reviewed.   ED Course  Procedures (including critical care time) Labs Review Labs Reviewed  COMPREHENSIVE METABOLIC PANEL - Abnormal; Notable for the following:    Glucose, Bld 144 (*)    AST 42 (*)    All other components within normal limits  ETHANOL  PROTIME-INR  APTT  CBC  DIFFERENTIAL  AMMONIA  LACTIC ACID, PLASMA  URINE RAPID DRUG SCREEN, HOSP PERFORMED  URINALYSIS, ROUTINE W REFLEX MICROSCOPIC (NOT AT [facility])  LACTIC ACID, PLASMA  I-STAT TROPOININ, ED    Imaging Review Ct Head Wo Contrast  02/01/2015   CLINICAL DATA:  Initial evaluation for acute left-sided facial numbness and drooping.  EXAM: CT HEAD WITHOUT CONTRAST  TECHNIQUE: Contiguous axial images were obtained from the base of the skull through the vertex without intravenous contrast.  COMPARISON:  Prior MRI from 04/03/2014.  FINDINGS: Cerebral volume within normal limits for patient age. Patchy hypodensity within the periventricular and deep white matter both cerebral hemispheres present, similar from previous MRI.   No acute large vessel territory infarct. No intracranial hemorrhage. No mass lesion, mass effect, or midline shift. No hydrocephalus.  Scalp soft tissues within normal limits. Calvarium intact. Paranasal sinuses and mastoid air cells are clear.  No acute abnormality about the orbits.  IMPRESSION: 1. No acute intracranial process. 2. Moderate chronic white matter disease, similar to previous MRI   Electronically Signed   By: Jeannine Boga M.D.   On: 02/01/2015 21:51   I, Dashea Mcmullan, Corene Cornea, personally reviewed and evaluated these images and lab results as part of my medical decision-making.   EKG Interpretation   Date/Time:  Thursday February 01 2015 20:48:29 EDT Ventricular Rate:  72 PR Interval:  176 QRS Duration: 87 QT Interval:  400 QTC Calculation: 438 R Axis:   16 Text Interpretation:  Sinus rhythm Low voltage, precordial leads Consider  anterior infarct No significant change since last tracing Confirmed by  Mingo Amber  MD, BLAIR ([id number]) on 02/01/2015 8:54:12 PM      MDM   Final diagnoses:  Facial droop   59 year old female presents to the emergency department approximate 2 hours after acute onset of left-sided facial droop and paresthesias. Initially on my evaluation still had facial droop and within the 4-1/2 hour timeframe so could've stroke was called. Neurology evaluated quickly and symptoms were improving and minimal so was decided not to administer TPA. She has multiple sclerosis and was not felt this was related to the her current symptoms. She also has some type of mitochondrial disease is being worked up at [facility] as well. She has been told she had ischemia on her MRI in the past but had never been worked up. She has no chest pain or arm pain or other symptoms at this time. Neurology is recommending patient be admitted for TIA workup with medicine over at [facility]. Medicine has been counseled to.  The patient appears reasonably stabilized for admission considering the current  resources, flow, and capabilities available in the ED at this time, and I doubt any other [facility] requiring further screening and/or treatment in the ED prior to admission.     Merrily Pew, MD 02/02/15 [phone]

## 2015-02-01 NOTE — ED Notes (Signed)
Pt was exercising and about one hour ago noticed the left side of her face going numb and drooping

## 2015-02-01 NOTE — ED Notes (Signed)
Patient returning from MRI 

## 2015-02-01 NOTE — Progress Notes (Signed)
Neurology Consultation Reason for Consult: Facial droop Referring Physician: Mesner, J  CC: Left facial weakness and numbness  History is obtained from: Patient  HPI: [name] is a 59 y.o. female who presents with left facial weakness and numbness started around 6:30 - 7 earlier this evening. She states that she was doing exercise and felt her left face going numb. When she got home her husband noticed that she had left facial drooping and asked her to come to the emergency room. She denies vision changes, diplopia, weakness or numbness in the arms or leg.  Since arrival here, she has noticed steady improvement, but not quite back to baseline.  She does have a history of multiple sclerosis.   LKW: 6:30 PM tpa given?: no, mild symptoms  ROS: A 14 point ROS was performed and is negative except as noted in the HPI.   Past Medical History  Diagnosis Date  . PULMONARY EMBOLISM 11/2006    post op  . DEEP VENOUS THROMBOPHLEBITIS 11/2006    post surg, anticoag x 6 mo  . DIABETES MELLITUS, TYPE II     Dr. Buddy Duty  . GERD   . HYPERLIPIDEMIA   . HYPERTENSION   . NEUROPATHY     related to MS  . HYPOTHYROIDISM     Dr. Buddy Duty  . MENOPAUSAL SYNDROME   . Multiple sclerosis     Dr. Jacqulynn Cadet (in [place])  . SINUS TARSI SYNDROME   . IBS   . TRANSAMINASES, SERUM, ELEVATED     Dr. Carlean Purl, referred to [facility]- FATTY LIVER  . Complication of anesthesia     difficulty waking  . TUBULOVILLOUS ADENOMA, COLON, HX OF 09/21/2008    Annotation: cecum, with high-grade dysplasia original colonoscopy Ganem resected by Dr. Johney Maine post-op leak and complicated course Qualifier: Diagnosis of  By: Carlean Purl MD, Dimas Millin   . Gout   . Fatty liver disease, nonalcoholic 6/0/1093    fibrosis noted 2016; involved in study through [place]  . PONV (postoperative nausea and vomiting)     needs scop patch  . Wears glasses   . HSV-1 (herpes simplex virus 1) infection     (sores below nose)--very rare  . Chronic  diarrhea     (since ileocecal valve removed 2008)    Family History: No hx of stroke  Social History: Tob: denies  Exam: Current vital signs: BP 167/91 mmHg  Pulse 79  Temp(Src) 98.3 F (36.8 C) (Oral)  Resp 12  SpO2 98% Vital signs in last 24 hours: Temp:  [98.3 F (36.8 C)] 98.3 F (36.8 C) (08/11 2046) Pulse Rate:  [67-79] 79 (08/11 2153) Resp:  [12-22] 12 (08/11 2153) BP: (167-179)/(88-91) 167/91 mmHg (08/11 2153) SpO2:  [97 %-98 %] 98 % (08/11 2153)   Physical Exam  Constitutional: Appears well-developed and well-nourished.  Psych: Affect appropriate to situation Eyes: No scleral injection HENT: No OP obstrucion Head: Normocephalic.  Cardiovascular: Normal rate and regular rhythm.  Respiratory: Effort normal and breath sounds normal to anterior ascultation GI: Soft.  No distension. There is no tenderness.  Skin: WDI  Neuro: Mental Status: Patient is awake, alert, oriented to person, place, month, year, and situation. Patient is able to give a clear and coherent history. No signs of aphasia or neglect Cranial Nerves: II: Visual Fields are full. Pupils are equal, round, and reactive to light.   III,IV, VI: EOMI without ptosis or diploplia.  V: Facial sensation is mildly decreased on left VII: Facial movement is very  mildly flattened NL fold, not apparent when smiling.  VIII: hearing is intact to voice X: Uvula elevates symmetrically XI: Shoulder shrug is symmetric. XII: tongue is midline without atrophy or fasciculations.  Motor: Tone is normal. Bulk is normal. 5/5 strength was present in all four extremities.  Sensory: Sensation is symmetric to light touch and temperature in the arms and legs. Plantars: Toes are downgoing bilaterally.  Cerebellar: FNF are intact bilaterally     I have reviewed labs in epic and the results pertinent to this consultation are: nml creatinien  I have reviewed the images obtained: CT head - no acute  findings  Impression: 59 year old with left facial droop and numbness which is rapidly resolving. With MS, the symptoms typically come on gradually resolve over a longer period of time. With her rapid resolution, I'm much more concerned that this could be transient ischemic attack would favor treating as such.  Recommendations: 1. HgbA1c, fasting lipid panel 2. MRI, MRA  of the brain without contrast 3. Frequent neuro checks 4. Echocardiogram 5. Carotid dopplers 6. Prophylactic therapy-Antiplatelet med: Aspirin - dose 325mg  PO or 300mg  PR 7. Risk factor modification 8. Telemetry monitoring 9. PT consult, OT consult, Speech consult    Roland Rack, MD [facility] [phone]  If 7pm- 7am, please page neurology on call as listed in [place].

## 2015-02-01 NOTE — ED Notes (Signed)
[name] is working her up for Mitochondrial disease currently and liver disease

## 2015-02-02 ENCOUNTER — Observation Stay (HOSPITAL_BASED_OUTPATIENT_CLINIC_OR_DEPARTMENT_OTHER): Payer: PPO

## 2015-02-02 DIAGNOSIS — R29810 Facial weakness: Secondary | ICD-10-CM | POA: Diagnosis not present

## 2015-02-02 DIAGNOSIS — G459 Transient cerebral ischemic attack, unspecified: Secondary | ICD-10-CM

## 2015-02-02 LAB — RAPID URINE DRUG SCREEN, HOSP PERFORMED
Amphetamines: NOT DETECTED
BARBITURATES: NOT DETECTED
BENZODIAZEPINES: NOT DETECTED
Cocaine: NOT DETECTED
Opiates: NOT DETECTED
Tetrahydrocannabinol: NOT DETECTED

## 2015-02-02 LAB — URINALYSIS, ROUTINE W REFLEX MICROSCOPIC
BILIRUBIN URINE: NEGATIVE
Glucose, UA: NEGATIVE mg/dL
Hgb urine dipstick: NEGATIVE
Ketones, ur: NEGATIVE mg/dL
Nitrite: NEGATIVE
PH: 5.5 (ref 5.0–8.0)
Protein, ur: NEGATIVE mg/dL
SPECIFIC GRAVITY, URINE: 1.018 (ref 1.005–1.030)
Urobilinogen, UA: 0.2 mg/dL (ref 0.0–1.0)

## 2015-02-02 LAB — GLUCOSE, CAPILLARY
GLUCOSE-CAPILLARY: 136 mg/dL — AB (ref 65–99)
Glucose-Capillary: 130 mg/dL — ABNORMAL HIGH (ref 65–99)
Glucose-Capillary: 97 mg/dL (ref 65–99)

## 2015-02-02 LAB — URINE MICROSCOPIC-ADD ON

## 2015-02-02 LAB — LIPID PANEL
CHOL/HDL RATIO: 4 ratio
Cholesterol: 139 mg/dL (ref 0–200)
HDL: 35 mg/dL — ABNORMAL LOW (ref >40)
LDL CALC: 55 mg/dL (ref 0–99)
TRIGLYCERIDES: 247 mg/dL — AB (ref <150)
VLDL: 49 mg/dL — AB (ref 0–40)

## 2015-02-02 LAB — LACTIC ACID, PLASMA: Lactic Acid, Venous: 1.6 mmol/L (ref 0.5–2.0)

## 2015-02-02 MED ORDER — LOSARTAN POTASSIUM 50 MG PO TABS
50.0000 mg | ORAL_TABLET | Freq: Every day | ORAL | Status: DC
Start: 2015-02-02 — End: 2015-02-02
  Administered 2015-02-02: 50 mg via ORAL
  Filled 2015-02-02: qty 1

## 2015-02-02 MED ORDER — ASPIRIN EC 81 MG PO TBEC: 81.0000 mg | DELAYED_RELEASE_TABLET | Freq: Every day | ORAL | Status: DC

## 2015-02-02 MED ORDER — ASPIRIN 81 MG PO TBEC: 81.0000 mg | DELAYED_RELEASE_TABLET | Freq: Every day | ORAL | Status: DC

## 2015-02-02 MED ORDER — HYDROCHLOROTHIAZIDE 12.5 MG PO CAPS
12.5000 mg | ORAL_CAPSULE | Freq: Every day | ORAL | Status: DC
  Administered 2015-02-02: 12.5 mg via ORAL
  Filled 2015-02-02: qty 1

## 2015-02-02 NOTE — Progress Notes (Signed)
Patient transferred from [facility] to [facility] at 0100, alert and oriented. Patient oriented to room and equipment. VSS. MAEW. No facial weakness noted upon arrival. All safety measures in place. Will monitor closely overnight.

## 2015-02-02 NOTE — ED Notes (Signed)
[facility] has been called for transport.  

## 2015-02-02 NOTE — Progress Notes (Signed)
STROKE TEAM PROGRESS NOTE   HISTORY [name] is a 59 y.o. female who presents with left facial weakness and numbness started around 6:30 - 7 earlier this evening. She states that she was doing exercise and felt her left face going numb. When she got home her husband noticed that she had left facial drooping and asked her to come to the emergency room. She denies vision changes, diplopia, weakness or numbness in the arms or leg.  Since arrival here, she has noticed steady improvement, but not quite back to baseline.  She does have a history of multiple sclerosis.   SUBJECTIVE (INTERVAL HISTORY) No family members present. Dr Leonie Man reviewed the pt's HPI with her.Suspect TIA rather than MS sxs. She has not been on any medication for MS in the past 6 years. The patient also reports that she has mitochondrial disease which is being followed by [facility]. She apparently had a reaction to Ativan earlier today. Recommend ASA 81 mg daily.   OBJECTIVE Temp:  [97.8 F (36.6 C)-98.3 F (36.8 C)] 98 F (36.7 C) (08/12 0500) Pulse Rate:  [67-83] 78 (08/12 0500) Cardiac Rhythm:  [-] Normal sinus rhythm (08/12 0115) Resp:  [11-22] 17 (08/12 0500) BP: (120-179)/(71-91) 120/71 mmHg (08/12 0500) SpO2:  [95 %-98 %] 95 % (08/12 0500) Weight:  [83.371 kg (183 lb 12.8 oz)-85.276 kg (188 lb)] 83.371 kg (183 lb 12.8 oz) (08/12 0109)  No results for input(s): GLUCAP in the last 168 hours.  Recent Labs Lab 02/01/15 2107  NA 138  K 3.7  CL 102  CO2 26  GLUCOSE 144*  BUN 15  CREATININE 0.79  CALCIUM 9.7    Recent Labs Lab 02/01/15 2107  AST 42*  ALT 53  ALKPHOS 76  BILITOT 1.0  PROT 7.7  ALBUMIN 4.1    Recent Labs Lab 02/01/15 2107  WBC 7.5  NEUTROABS 4.4  HGB 13.5  HCT 40.7  MCV 87.9  PLT 245   No results for input(s): CKTOTAL, CKMB, CKMBINDEX, TROPONINI in the last 168 hours.  Recent Labs  02/01/15 2107  LABPROT 13.6  INR 1.02    Recent Labs   02/02/15 0300  COLORURINE YELLOW  LABSPEC 1.018  PHURINE 5.5  GLUCOSEU NEGATIVE  HGBUR NEGATIVE  BILIRUBINUR NEGATIVE  KETONESUR NEGATIVE  PROTEINUR NEGATIVE  UROBILINOGEN 0.2  NITRITE NEGATIVE  LEUKOCYTESUR SMALL*       Component Value Date/Time   CHOL 139 02/02/2015 0609   TRIG 247* 02/02/2015 0609   TRIG 199 04/03/2010   HDL 35* 02/02/2015 0609   CHOLHDL 4.0 02/02/2015 0609   VLDL 49* 02/02/2015 0609   LDLCALC 55 02/02/2015 0609   Lab Results  Component Value Date   HGBA1C 6.1 01/20/2011      Component Value Date/Time   LABOPIA NONE DETECTED 02/02/2015 0300   COCAINSCRNUR NONE DETECTED 02/02/2015 0300   LABBENZ NONE DETECTED 02/02/2015 0300   AMPHETMU NONE DETECTED 02/02/2015 0300   THCU NONE DETECTED 02/02/2015 0300   LABBARB NONE DETECTED 02/02/2015 0300     Recent Labs Lab 02/01/15 2107  ETH <5     IMAGING  Ct Head Wo Contrast 02/01/2015    1. No acute intracranial process.  2. Moderate chronic white matter disease, similar to previous MRI      Mr Virgel Paling Wo Contrast 02/02/2015     MRI HEAD 1. No acute intracranial infarct or other abnormality identified.  2. Extensive white matter changes involving the supratentorial white matter,  consistent with known history of multiple sclerosis. Overall, these findings are not significantly progressed relative to most recent MRI from 04/03/2014. No evidence for active demyelination.   MRA HEAD  Normal MRA of the intracranial circulation.      PHYSICAL EXAM  pleasant middle-aged Caucasian lady currently not in distress. . Afebrile. Head is nontraumatic. Neck is supple without bruit.    Cardiac exam no murmur or gallop. Lungs are clear to auscultation. Distal pulses are well felt.  Neurological Exam ;  Awake  Alert oriented x 3. Normal speech and language.eye movements full without nystagmus.fundi were not visualized. Vision acuity and fields appear normal. Hearing is normal. Palatal movements are normal.  Face symmetric. Tongue midline. Mild subjective paresthesias in the left corner of the mouth. Normal strength, tone, reflexes and coordination. Normal sensation. Gait deferred.    ASSESSMENT/PLAN [name] is a 59 y.o. female with history of multiple sclerosis, pulmonary embolism, DVT, diabetes mellitus, hyperlipidemia, hypertension, gout, and fatty liver disease presenting with left facial weakness and numbness. She did not receive IV t-PA due to improvement in deficits.  TIA:  Non-dominant right brain from likely small vessel disease.  Resultant  resolution of deficits.  MRI  no acute abnormality. Extensive changes of periventricular and subcortical white matter hyperintensities which are nonspecific and and may be seen in multiple sclerosis but other conditions too. Lack of significant worsening over 3 years without treatment for multiple sclerosis is surprising.  MRA  normal  Carotid Doppler 1-39% internal carotid artery stenosis bilaterally. Vertebral arteries are patent with antegrade flow.  2D Echo  pending  LDL 55  HgbA1c pending  Lovenox for VTE prophylaxis  Diet Carb Modified Fluid consistency:: Thin; Room service appropriate?: Yes  no antithrombotic prior to admission, now on aspirin 325 mg orally every day Change to ASA 81 mg.  Patient counseled to be compliant with her antithrombotic medications  Ongoing aggressive stroke risk factor management  Therapy recommendations: Pending  Disposition: Pending  Hypertension  Home meds: Cozaar, chlorothiazide, propranolol  Stable   Hyperlipidemia  Home meds:  Pravachol 80 mg daily resumed in hospital  LDL 55, goal < 70  Continue statin at discharge  Diabetes  HgbA1c pending, goal < 7.0  Controlled  Other Stroke Risk Factors  Obesity, Body mass index is 30.59 kg/(m^2).    Other Active Problems    Other Pertinent History   PLAN  Discharge when workup complete (hemoglobin A1c and 2-D  echo pending)  MS follow-up with her neurologist Dr. Dellis Filbert.  Continue aspirin 81 mg daily.   Hospital day #   Mikey Bussing PA-C [facility] Pager [phone] 02/02/2015, 3:09 PM  I have personally examined this patient, reviewed notes, independently viewed imaging studies, participated in medical decision making and plan of care. I have made any additions or clarifications directly to the above note. Agree with note above.  She has presented with. Left face numbness and weakness due to to suspected right brain TIA likely from small vessel disease and remains at risk for neurological worsening, recurrent stroke/TIAs and needs ongoing stroke evaluation and aggressive risk factor modification. Continue aspirin for stroke prevention.   Antony Contras, MD Medical Director [facility] Pager: [phone] 02/02/2015 3:30 PM    To contact Stroke Continuity provider, please refer to [email or web address] After hours, contact General Neurology

## 2015-02-02 NOTE — Progress Notes (Signed)
Echocardiogram 2D Echocardiogram has been performed.  [name] 02/02/2015, 12:15 PM

## 2015-02-02 NOTE — Discharge Summary (Signed)
Physician Discharge Summary  [name] IRS:[id number] DOB: 1955-11-27 DOA: 02/01/2015  PCP: [name] Ports, MD  Admit date: 02/01/2015 Discharge date: 02/02/2015  Recommendations for Outpatient Follow-up:  1.   Discharge Diagnoses:  Principal Problem:   TIA (transient ischemic attack) Active Problems:   DM2 (diabetes mellitus, type 2)   Multiple sclerosis   Essential hypertension   Mixed hyperlipidemia   Discharge Condition: stable  Diet recommendation: heart healthy  Filed Weights   02/02/15 0109  Weight: 83.371 kg (183 lb 12.8 oz)    History of present illness:  59 y.o. female who presents with left facial weakness and numbness started around 6:30 - 7 earlier this evening. She states that she was doing exercise and felt her left face going numb. When she got home her husband noticed that she had left facial drooping and asked her to come to the emergency room. She denies vision changes, diplopia, weakness or numbness in the arms or leg.  Since arrival here, she has noticed steady improvement, but slight numbness continues  She does have a history of multiple sclerosis.  Hospital Course:  TIA: Non-dominant right brain from likely small vessel disease.  Resultant resolution of deficits.  MRI no acute abnormality. Extensive changes of periventricular and subcortical white matter hyperintensities which are nonspecific and and may be seen in multiple sclerosis but other conditions too. Lack of significant worsening over 3 years without treatment for multiple sclerosis is surprising.  MRA normal  Carotid Doppler 1-39% internal carotid artery stenosis bilaterally. Vertebral arteries are patent with antegrade flow.  2D Echo no source of embolus  LDL 55  HgbA1c 6.4  no antithrombotic prior to admission, add ASA 81 mg.  Hypertension  Home meds: Cozaar, chlorothiazide, propranolol  Stable   Hyperlipidemia  Home meds: Pravachol 80 mg daily resumed in  hospital  LDL 55, goal < 70  Continue statin at discharge  Diabetes  Controlled   Procedures:  none  Consultations:  neurology  Discharge Exam: Filed Vitals:   02/02/15 1409  BP: 124/77  Pulse: 71  Temp: 98.1 F (36.7 C)  Resp: 16    General: a and o Cardiovascular: RRR Respiratory: CTA Neuro: nonfocal  Discharge Instructions   Discharge Instructions    Activity as tolerated - No restrictions    Complete by:  As directed      Diet - low sodium heart healthy    Complete by:  As directed           Current Discharge Medication List    START taking these medications   Details  aspirin EC 81 MG EC tablet Take 1 tablet (81 mg total) by mouth daily.      CONTINUE these medications which have NOT CHANGED   Details  fish oil-omega-3 fatty acids 1000 MG capsule Take 4 g by mouth daily.     levothyroxine (SYNTHROID, LEVOTHROID) 137 MCG tablet Take 137 mcg by mouth daily.    losartan (COZAAR) 25 MG tablet Take 25 mg by mouth daily. Refills: 5    losartan-hydrochlorothiazide (HYZAAR) 50-12.5 MG per tablet Take 1 tablet by mouth daily. Qty: 90 tablet, Refills: 0    metFORMIN (GLUCOPHAGE) 500 MG tablet Take 1,000 mg by mouth 2 (two) times daily with a meal.     Multiple Vitamin (MULTIVITAMIN) tablet Take 1 tablet by mouth daily.      omeprazole (PRILOSEC) 20 MG capsule TAKE ONE CAPSULE BY MOUTH EVERY DAY Qty: 90 capsule, Refills: 0    Potassium  99 MG TABS Take 1 tablet by mouth daily.     pravastatin (PRAVACHOL) 80 MG tablet TAKE 1 TABLET BY MOUTH EVERY DAY Qty: 90 tablet, Refills: 0    propranolol ER (INDERAL LA) 120 MG 24 hr capsule TAKE ONE CAPSULE BY MOUTH DAILY Qty: 90 capsule, Refills: 1    STUDY MEDICATION 1 oral and 1 injectable study medication for fatty liver.    TRUEPLUS LANCETS 28G MISC by Does not apply route as needed.      TRUETEST TEST test strip USE TWICE DAILY TO CHECK BLOOD SUGARS Qty: 200 each, Refills: 1       Allergies   Allergen Reactions  . Ativan [Lorazepam] Swelling   Follow-up Information    Follow up with [name],[name] A, MD.   Specialty:  Family Medicine   Contact information:   [address] [place] [address] [phone]        The results of significant diagnostics from this hospitalization (including imaging, microbiology, ancillary and laboratory) are listed below for reference.    Significant Diagnostic Studies: Ct Head Wo Contrast  02/01/2015   CLINICAL DATA:  Initial evaluation for acute left-sided facial numbness and drooping.  EXAM: CT HEAD WITHOUT CONTRAST  TECHNIQUE: Contiguous axial images were obtained from the base of the skull through the vertex without intravenous contrast.  COMPARISON:  Prior MRI from 04/03/2014.  FINDINGS: Cerebral volume within normal limits for patient age. Patchy hypodensity within the periventricular and deep white matter both cerebral hemispheres present, similar from previous MRI.  No acute large vessel territory infarct. No intracranial hemorrhage. No mass lesion, mass effect, or midline shift. No hydrocephalus.  Scalp soft tissues within normal limits. Calvarium intact. Paranasal sinuses and mastoid air cells are clear.  No acute abnormality about the orbits.  IMPRESSION: 1. No acute intracranial process. 2. Moderate chronic white matter disease, similar to previous MRI   Electronically Signed   By: [name] M.D.   On: 02/01/2015 21:51   Mr [name] Head Wo Contrast  02/02/2015   CLINICAL DATA:  Initial evaluation for acute left-sided facial numbness with tripping. History of multiple sclerosis.  EXAM: MRI HEAD WITHOUT AND WITH CONTRAST  MRA HEAD WITHOUT CONTRAST  TECHNIQUE: Multiplanar, multiecho pulse sequences of the brain and surrounding structures were obtained without and with intravenous contrast. Angiographic images of the head were obtained using MRA technique without contrast.  CONTRAST:  59mL MULTIHANCE GADOBENATE DIMEGLUMINE 529  MG/ML IV SOLN  COMPARISON:  Prior CT from earlier the same day as well as previous MRI from 04/03/2014.  FINDINGS: MRI HEAD FINDINGS  No abnormal foci of restricted diffusion to suggest acute intracranial infarct. Gray-white matter differentiation maintained. Normal intravascular flow voids preserved. No acute or chronic intracranial hemorrhage.  No mass lesion, midline shift, or mass effect. No hydrocephalus. No extra-axial fluid collection.  Extensive patchy T2/FLAIR hyperintense foci seen within the periventricular and deep white matter of both cerebral hemispheres, consistent with known history of multiple sclerosis. Allowing for differences in technique, these foci are overall not significantly changed relative to previous MRI from 04/03/2014. No abnormal enhancement or restricted diffusion to suggest active demyelination.  Craniocervical junction within normal limits. Pituitary gland normal. No acute abnormality about the orbits.  Mild mucosal thickening within the ethmoidal air cells. Paranasal sinuses are otherwise clear. No mastoid effusion. Inner ear structures grossly normal.  Bone marrow signal intensity within normal limits. Scalp soft tissues unremarkable.  MRA HEAD FINDINGS  ANTERIOR CIRCULATION:  Visualized distal cervical segments  of the internal carotid arteries are widely patent with antegrade flow. The petrous, cavernous, and supra clinoid segments are well opacified bilaterally. A1 segments, anterior communicating artery common anterior cerebral arteries widely patent. M1 segments well opacified without stenosis or occlusion. MCA bifurcations within normal limits. Distal MCA branches symmetric and well opacified bilaterally.  POSTERIOR CIRCULATION:  Vertebral arteries are widely patent to the vertebrobasilar junction. Posterior inferior cerebral arteries patent bilaterally. Basilar artery well opacified without stenosis or occlusion. Left anterior inferior cerebral artery is dominant. Superior  cerebellar arteries patent proximally. Posterior cerebral arteries patent bilaterally. Bilateral posterior communicating arteries noted. Right PCA is somewhat bulbous at the confluence of the right P com and right P1/P2 segment.  No aneurysm or vascular malformation.  IMPRESSION: MRI HEAD IMPRESSION:  1. No acute intracranial infarct or other abnormality identified. 2. Extensive white matter changes involving the supratentorial white matter, consistent with known history of multiple sclerosis. Overall, these findings are not significantly progressed relative to most recent MRI from 04/03/2014. No evidence for active demyelination. MRA HEAD IMPRESSION:  Normal MRA of the intracranial circulation.   Electronically Signed   By: [name] M.D.   On: 02/02/2015 00:00   Mr Jeri Cos FB Contrast  02/02/2015   CLINICAL DATA:  Initial evaluation for acute left-sided facial numbness with tripping. History of multiple sclerosis.  EXAM: MRI HEAD WITHOUT AND WITH CONTRAST  MRA HEAD WITHOUT CONTRAST  TECHNIQUE: Multiplanar, multiecho pulse sequences of the brain and surrounding structures were obtained without and with intravenous contrast. Angiographic images of the head were obtained using MRA technique without contrast.  CONTRAST:  63mL MULTIHANCE GADOBENATE DIMEGLUMINE 529 MG/ML IV SOLN  COMPARISON:  Prior CT from earlier the same day as well as previous MRI from 04/03/2014.  FINDINGS: MRI HEAD FINDINGS  No abnormal foci of restricted diffusion to suggest acute intracranial infarct. Gray-white matter differentiation maintained. Normal intravascular flow voids preserved. No acute or chronic intracranial hemorrhage.  No mass lesion, midline shift, or mass effect. No hydrocephalus. No extra-axial fluid collection.  Extensive patchy T2/FLAIR hyperintense foci seen within the periventricular and deep white matter of both cerebral hemispheres, consistent with known history of multiple sclerosis. Allowing for differences  in technique, these foci are overall not significantly changed relative to previous MRI from 04/03/2014. No abnormal enhancement or restricted diffusion to suggest active demyelination.  Craniocervical junction within normal limits. Pituitary gland normal. No acute abnormality about the orbits.  Mild mucosal thickening within the ethmoidal air cells. Paranasal sinuses are otherwise clear. No mastoid effusion. Inner ear structures grossly normal.  Bone marrow signal intensity within normal limits. Scalp soft tissues unremarkable.  MRA HEAD FINDINGS  ANTERIOR CIRCULATION:  Visualized distal cervical segments of the internal carotid arteries are widely patent with antegrade flow. The petrous, cavernous, and supra clinoid segments are well opacified bilaterally. A1 segments, anterior communicating artery common anterior cerebral arteries widely patent. M1 segments well opacified without stenosis or occlusion. MCA bifurcations within normal limits. Distal MCA branches symmetric and well opacified bilaterally.  POSTERIOR CIRCULATION:  Vertebral arteries are widely patent to the vertebrobasilar junction. Posterior inferior cerebral arteries patent bilaterally. Basilar artery well opacified without stenosis or occlusion. Left anterior inferior cerebral artery is dominant. Superior cerebellar arteries patent proximally. Posterior cerebral arteries patent bilaterally. Bilateral posterior communicating arteries noted. Right PCA is somewhat bulbous at the confluence of the right P com and right P1/P2 segment.  No aneurysm or vascular malformation.  IMPRESSION: MRI HEAD IMPRESSION:  1. No acute  intracranial infarct or other abnormality identified. 2. Extensive white matter changes involving the supratentorial white matter, consistent with known history of multiple sclerosis. Overall, these findings are not significantly progressed relative to most recent MRI from 04/03/2014. No evidence for active demyelination. MRA HEAD  IMPRESSION:  Normal MRA of the intracranial circulation.   Electronically Signed   By: [name] M.D.   On: 02/02/2015 00:00   Echo Left ventricle: The cavity size was normal. Wall thickness was normal. Systolic function was normal. The estimated ejection fraction was in the range of 60% to 65%. Wall motion was normal; there were no regional wall motion abnormalities. Doppler parameters are consistent with abnormal left ventricular relaxation (grade 1 diastolic dysfunction).  Microbiology: No results found for this or any previous visit (from the past 240 hour(s)).   Labs: Basic Metabolic Panel:  Recent Labs Lab 02/01/15 2107  NA 138  K 3.7  CL 102  CO2 26  GLUCOSE 144*  BUN 15  CREATININE 0.79  CALCIUM 9.7   Liver Function Tests:  Recent Labs Lab 02/01/15 2107  AST 42*  ALT 53  ALKPHOS 76  BILITOT 1.0  PROT 7.7  ALBUMIN 4.1   No results for input(s): LIPASE, AMYLASE in the last 168 hours.  Recent Labs Lab 02/01/15 2107  AMMONIA 16   CBC:  Recent Labs Lab 02/01/15 2107  WBC 7.5  NEUTROABS 4.4  HGB 13.5  HCT 40.7  MCV 87.9  PLT 245   Cardiac Enzymes: No results for input(s): CKTOTAL, CKMB, CKMBINDEX, TROPONINI in the last 168 hours. BNP: BNP (last 3 results) No results for input(s): BNP in the last 8760 hours.  ProBNP (last 3 results) No results for input(s): PROBNP in the last 8760 hours.  CBG:  Recent Labs Lab 02/02/15 0745 02/02/15 1059  GLUCAP 136* 130*       Signed:  Tara Rud L  [facility] 02/02/2015, 3:15 PM

## 2015-02-02 NOTE — Progress Notes (Signed)
*  PRELIMINARY RESULTS* Vascular Ultrasound Carotid Duplex (Doppler) has been completed.   Findings suggest 1-39% internal carotid artery stenosis bilaterally. Vertebral arteries are patent with antegrade flow.  02/02/2015 11:44 AM Maudry Mayhew, RVT, RDCS, RDMS

## 2015-02-02 NOTE — Progress Notes (Signed)
Pt is being discharged home. Discharge instructions were given to patient

## 2015-02-03 LAB — HEMOGLOBIN A1C
Hgb A1c MFr Bld: 6.4 % — ABNORMAL HIGH (ref 4.8–5.6)
Mean Plasma Glucose: 137 mg/dL

## 2015-02-04 ENCOUNTER — Other Ambulatory Visit: Payer: Self-pay | Admitting: Family Medicine

## 2015-02-05 ENCOUNTER — Other Ambulatory Visit: Payer: Self-pay | Admitting: Family Medicine

## 2015-02-07 ENCOUNTER — Inpatient Hospital Stay: Payer: Self-pay | Admitting: Family Medicine

## 2015-02-07 ENCOUNTER — Telehealth: Payer: Self-pay | Admitting: *Deleted

## 2015-02-07 NOTE — Telephone Encounter (Signed)
She does not need to f/u with me at this time. Okay just to follow with [facility] right now.  I reviewed her studies done in the hospital (CT, MRI, MRA, echo, carotid u/s) and labs (DM is well controlled, lipids are at goal).  I agree with addition of 81mg  aspirin daily.

## 2015-02-07 NOTE — Telephone Encounter (Signed)
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 Dr.Deal had told her that over the last 3 months her fatty liver has worsened and progressed substantially. She is schedule to have liver biopsy next Wednesday. Dr.Deal did follow up on her possible TIS(reason for hospital f/u today). She did another EKG today and it was fine. Dr. Elie Confer told patient that she does not feel that she had a TIA but rather these symptoms were related to the new diagnosis. Patient had called to come in today due to pressure in her head, when she woke up today it was gone. I encouraged her to r/s the visit with you. She said she would rather wait and she if she has another episode, Dr.Deal has put her mind at ease but did state that she is willing to do whatever you ask of her. Please advise. Thank you.

## 2015-03-05 ENCOUNTER — Encounter: Payer: PPO | Admitting: Family Medicine

## 2015-03-22 ENCOUNTER — Encounter: Payer: PPO | Admitting: Family Medicine

## 2015-04-05 ENCOUNTER — Ambulatory Visit (INDEPENDENT_AMBULATORY_CARE_PROVIDER_SITE_OTHER): Payer: PPO | Admitting: Family Medicine

## 2015-04-05 ENCOUNTER — Encounter: Payer: Self-pay | Admitting: Family Medicine

## 2015-04-05 VITALS — BP 148/88 | HR 68 | Ht 66.0 in | Wt 189.6 lb

## 2015-04-05 DIAGNOSIS — T148 Other injury of unspecified body region: Secondary | ICD-10-CM

## 2015-04-05 DIAGNOSIS — Z23 Encounter for immunization: Secondary | ICD-10-CM | POA: Diagnosis not present

## 2015-04-05 DIAGNOSIS — Z5181 Encounter for therapeutic drug level monitoring: Secondary | ICD-10-CM | POA: Diagnosis not present

## 2015-04-05 DIAGNOSIS — W57XXXA Bitten or stung by nonvenomous insect and other nonvenomous arthropods, initial encounter: Secondary | ICD-10-CM

## 2015-04-05 DIAGNOSIS — I10 Essential (primary) hypertension: Secondary | ICD-10-CM

## 2015-04-05 DIAGNOSIS — E782 Mixed hyperlipidemia: Secondary | ICD-10-CM

## 2015-04-05 LAB — CBC WITH DIFFERENTIAL/PLATELET
Basophils Absolute: 0 10*3/uL (ref 0.0–0.1)
Basophils Relative: 0 % (ref 0–1)
EOS PCT: 2 % (ref 0–5)
Eosinophils Absolute: 0.1 10*3/uL (ref 0.0–0.7)
HEMATOCRIT: 39.3 % (ref 36.0–46.0)
Hemoglobin: 13.4 g/dL (ref 12.0–15.0)
LYMPHS ABS: 2.4 10*3/uL (ref 0.7–4.0)
LYMPHS PCT: 34 % (ref 12–46)
MCH: 29.6 pg (ref 26.0–34.0)
MCHC: 34.1 g/dL (ref 30.0–36.0)
MCV: 86.9 fL (ref 78.0–100.0)
MPV: 9 fL (ref 8.6–12.4)
Monocytes Absolute: 0.5 10*3/uL (ref 0.1–1.0)
Monocytes Relative: 7 % (ref 3–12)
Neutro Abs: 4 10*3/uL (ref 1.7–7.7)
Neutrophils Relative %: 57 % (ref 43–77)
Platelets: 250 10*3/uL (ref 150–400)
RBC: 4.52 MIL/uL (ref 3.87–5.11)
RDW: 14 % (ref 11.5–15.5)
WBC: 7 10*3/uL (ref 4.0–10.5)

## 2015-04-05 LAB — COMPREHENSIVE METABOLIC PANEL
ALT: 51 U/L — ABNORMAL HIGH (ref 6–29)
AST: 31 U/L (ref 10–35)
Albumin: 4.4 g/dL (ref 3.6–5.1)
Alkaline Phosphatase: 81 U/L (ref 33–130)
BUN: 7 mg/dL (ref 7–25)
CHLORIDE: 102 mmol/L (ref 98–110)
CO2: 28 mmol/L (ref 20–31)
CREATININE: 0.61 mg/dL (ref 0.50–0.99)
Calcium: 10 mg/dL (ref 8.6–10.4)
GLUCOSE: 112 mg/dL — AB (ref 65–99)
Potassium: 4 mmol/L (ref 3.5–5.3)
Sodium: 142 mmol/L (ref 135–146)
Total Bilirubin: 0.7 mg/dL (ref 0.2–1.2)
Total Protein: 6.9 g/dL (ref 6.1–8.1)

## 2015-04-05 LAB — LIPID PANEL
Cholesterol: 128 mg/dL (ref 125–200)
HDL: 42 mg/dL — AB (ref >=46)
LDL CALC: 53 mg/dL (ref <130)
Total CHOL/HDL Ratio: 3 Ratio (ref <=5.0)
Triglycerides: 165 mg/dL — ABNORMAL HIGH (ref <150)
VLDL: 33 mg/dL — ABNORMAL HIGH (ref <30)

## 2015-04-05 NOTE — Patient Instructions (Signed)
Continue low sodium diet and regular exercise. Continue current medications.  Return for nurse visit at your convenience to check the accuracy of your new blood pressure monitor--check it with your machine, and let us check it with ours. It is accurate if it is within 10 points of what we get.  That way we can see if you just have "white coat" hypertension, or if your blood pressure monitor isn't accurate, BP is running high, and medication adjustments are needed.

## 2015-04-05 NOTE — Progress Notes (Signed)
Chief Complaint  Patient presents with  . Hypertension    fasting med check. Has several tick bites, she gets quite a few when she goes to her daughter's house. Has had a rash and joint pain, stiff neck and shoulder pain, fever and face feel hot-started mid Sept except rash was in the summer.    She reports that there are lots of ticks at her daughters house, she is there all the time. She gets them everywhere--they are very tiny (like the top of a pin, hard to see), and they attach to her skin (bleed when she pulls them off, doesn't really see them very engorged).  She checks skin every time she comes in from her daughter's house. She uses DEET.  She had a rash on her right lateral leg, thought it was a mosquito bite, didn't have bullseye.  In the middle of September she had significant joint pain where she could hardly move.  At end of September she had stiff neck and shoulders. Over the last week she has felt warm/flushed.  Temp has been up slightly (she normally runs low).  She is wondering if perhaps these symptoms could be related to Lyme disease given the many tick bites she has had. Her husband had RMSF a few years ago. She was treated presumptively for Lyme disease around the same time.  She denies fevers, severe headaches, target lesions, just the one rash on her knee that has resolved.  She brought one of the ticks in today--very small, all black tick Sometimes has pain in her knees, elbows.  No swelling.    We reviewed her hospitalization: admitted 8/11-12/16 with L facial weakness/numbness/dropping. Dx TIA (non-dominant R brain from likely small vessel disease). ASA $RemoveBefor'81mg'KUuXuvQSrJoF$  daily was added. head CT unchanged (mod chronic WM dz); MRI--WM changes c/w MS, normal MRA; no acute infarct/bleed carotid dopplers 1-39% ICA stenosis bilat, vertebral a patent, antegrade flow. 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 No further symptoms.  She states it was mainly perioral tingling, not really sure if there was facial drooping or not.  HTN--She is taking Benicar HCT without any side effects. She checks BP at home, and it has been 110-120/70-80. She recently bought a new BP monitor. Her BP's at her [facility] appointments (after sitting a few minutes there) are also always within this range.  Denies headaches, dizziness, chest pain, shortness of breath, edema.  Hyperlipidemia follow-up:  Patient is reportedly following a low-fat, low cholesterol diet.  Compliant with pravastatin and denies medication side effects. She has started on Coenzyme Q10 not for any myalgias, but as part of treatment for mitochondrial disorder. She was on research meds when lipids checked in March. She has now been of those for at least a month.  One was a weekly injection (Gilyad study), and one was a tablet.  She is asking to check potassium today. Denies any muscle cramps.  PMH, PSH, SH reviewed.  Outpatient Encounter Prescriptions as of 04/05/2015  Medication Sig Note  . aspirin EC 81 MG EC tablet Take 1 tablet (81 mg total) by mouth daily.   . Coenzyme Q10 200 MG capsule Take 200 mg by mouth 2 (two) times daily.   . fish oil-omega-3 fatty acids 1000 MG capsule Take 4 g by mouth daily.  02/01/2015: .  . levothyroxine (SYNTHROID, LEVOTHROID) 137 MCG tablet Take 137 mcg by mouth daily. 02/01/2015: .  . losartan-hydrochlorothiazide (HYZAAR) 50-12.5 MG per tablet TAKE 1 TABLET BY MOUTH DAILY.   . metFORMIN (GLUCOPHAGE) 500 MG tablet Take 1,000 mg by mouth 2 (two) times daily with a meal.  02/01/2015: .  Marland Kitchen Multiple Vitamin (MULTIVITAMIN) tablet Take 1 tablet by mouth daily.     Marland Kitchen omeprazole (PRILOSEC) 20 MG capsule TAKE ONE CAPSULE BY MOUTH EVERY DAY   . Potassium 99 MG TABS Take 1 tablet by mouth daily.  02/01/2015: .  . pravastatin  (PRAVACHOL) 80 MG tablet TAKE 1 TABLET BY MOUTH EVERY DAY   . propranolol ER (INDERAL LA) 120 MG 24 hr capsule TAKE ONE CAPSULE BY MOUTH DAILY 01/05/2014: Originally prescribed by neuro; helps with tremor  . TRUEPLUS LANCETS 28G MISC by Does not apply route as needed.     . TRUETEST TEST test strip USE TWICE DAILY TO CHECK BLOOD SUGARS   . [DISCONTINUED] losartan (COZAAR) 25 MG tablet Take 25 mg by mouth daily. 02/01/2015: .  Marland Kitchen [DISCONTINUED] pravastatin (PRAVACHOL) 80 MG tablet TAKE 1 TABLET BY MOUTH EVERY DAY   . [DISCONTINUED] STUDY MEDICATION 1 oral and 1 injectable study medication for fatty liver. 02/01/2015: injections once a week..    Last injection was 01/29/15..    Tablets daily . Last tablet will be 02/02/15.. Pt does not know name of them.   No facility-administered encounter medications on file as of 04/05/2015.   Allergies  Allergen Reactions  . Ativan [Lorazepam] Swelling   ROS: see HPI re: joint pains and other symptoms.  Denies headaches, dizziness, no further facial tingling. No URI symptoms, cough, shortness of breath, chest pain, palpitations, GI or GU complaints. Dr. Ronita [name] removed the cervical polyp found at last GYN exam.  Denies bleeding, bruising, depression or other complaints.  PHYSICAL EXAM: BP 158/88 mmHg  Pulse 68  Ht $R'5\' 6"'IM$  (1.676 m)  Wt 189 lb 9.6 oz (86.002 kg)  BMI 30.62 kg/m2 148/88 on repeat by MD  Well developed, pleasant female in no distress HEENT: PERRL EOMI, conjunctiva clear. OP clear Neck: no lymphadenopathy or mass Heart: regular rate and rhythm Lungs: clear bilaterally Abdomen: soft, nontender, no organomegaly or mass Extremities: no edema, 2+ pulses Neuro: alert and oriented. Cranial nerves intact. Normal gait Psych: normal mood, affect, hygiene and grooming.  Mildly anxious. Skin: no rashes/lesions noted.  ASSESSMENT/PLAN:  Essential hypertension - need to verify accuracy of new monitor. BP normal at home, elevated in office. Low sodium  diet, exercise and weight loss rec - Plan: Comprehensive metabolic panel  Need for prophylactic vaccination and inoculation against influenza - Plan: Flu Vaccine QUAD 36+ mos PF IM (Fluarix & Fluzone Quad PF)  Mixed hyperlipidemia - recheck now that she is off study drug - Plan: Lipid panel  Tick bite - doubt Lyme--reviewed pics of ticks causing Lyme and RMSF - Plan: CBC with Differential/Platelet  Medication monitoring encounter - Plan: Lipid panel, Comprehensive metabolic panel, CBC with Differential/Platelet   Has at least a month left of all meds, no refills needed today. Okay to refill all meds for 6 months when requests come through in November.  Lipids, c-met, CBC, Lyme titers  Send results to Dr. Buddy Duty

## 2015-04-06 ENCOUNTER — Encounter: Payer: Self-pay | Admitting: Family Medicine

## 2015-04-06 LAB — LYME AB/WESTERN BLOT REFLEX: B burgdorferi Ab IgG+IgM: 0.43 {ISR}

## 2015-05-02 ENCOUNTER — Other Ambulatory Visit: Payer: Self-pay | Admitting: Family Medicine

## 2015-05-16 ENCOUNTER — Other Ambulatory Visit: Payer: Self-pay | Admitting: Family Medicine

## 2015-05-19 ENCOUNTER — Other Ambulatory Visit: Payer: Self-pay | Admitting: Family Medicine

## 2015-07-04 ENCOUNTER — Other Ambulatory Visit: Payer: Self-pay | Admitting: Family Medicine

## 2015-08-02 DIAGNOSIS — E1169 Type 2 diabetes mellitus with other specified complication: Secondary | ICD-10-CM | POA: Diagnosis not present

## 2015-08-02 DIAGNOSIS — E079 Disorder of thyroid, unspecified: Secondary | ICD-10-CM | POA: Diagnosis not present

## 2015-08-02 DIAGNOSIS — Z86711 Personal history of pulmonary embolism: Secondary | ICD-10-CM | POA: Diagnosis not present

## 2015-08-02 DIAGNOSIS — G35 Multiple sclerosis: Secondary | ICD-10-CM | POA: Diagnosis not present

## 2015-08-02 DIAGNOSIS — R011 Cardiac murmur, unspecified: Secondary | ICD-10-CM | POA: Diagnosis not present

## 2015-08-02 DIAGNOSIS — I10 Essential (primary) hypertension: Secondary | ICD-10-CM | POA: Diagnosis not present

## 2015-08-02 DIAGNOSIS — G4709 Other insomnia: Secondary | ICD-10-CM | POA: Diagnosis not present

## 2015-08-02 DIAGNOSIS — E785 Hyperlipidemia, unspecified: Secondary | ICD-10-CM | POA: Diagnosis not present

## 2015-08-02 DIAGNOSIS — E8840 Mitochondrial metabolism disorder, unspecified: Secondary | ICD-10-CM | POA: Diagnosis not present

## 2015-09-05 ENCOUNTER — Ambulatory Visit: Payer: PPO | Admitting: Cardiology

## 2015-09-10 ENCOUNTER — Encounter: Payer: Self-pay | Admitting: *Deleted

## 2015-09-13 ENCOUNTER — Ambulatory Visit (INDEPENDENT_AMBULATORY_CARE_PROVIDER_SITE_OTHER): Payer: PPO | Admitting: Cardiology

## 2015-09-13 ENCOUNTER — Encounter: Payer: Self-pay | Admitting: Cardiology

## 2015-09-13 VITALS — BP 140/84 | HR 68 | Ht 65.0 in | Wt 196.0 lb

## 2015-09-13 DIAGNOSIS — G35 Multiple sclerosis: Secondary | ICD-10-CM

## 2015-09-13 DIAGNOSIS — R011 Cardiac murmur, unspecified: Secondary | ICD-10-CM

## 2015-09-13 DIAGNOSIS — I10 Essential (primary) hypertension: Secondary | ICD-10-CM

## 2015-09-13 NOTE — Progress Notes (Signed)
Cardiology Office Note    Date:  09/13/2015   ID:  [name], DOB January 01, 1956, MRN [id number]  PCP:  Aura Dials, PA-C  Cardiologist:   Candee Furbish, MD     History of Present Illness:  [name] is a 60 y.o. female here for evaluation of heart murmur at the request of [place], [place]. She has a history as well as multiple sclerosis, mitochondrial disease, type 2 diabetes with diabetic dyslipidemia, hypertension and history of pulmonary embolism. No significant shortness of breath or chest pain. She does have a history of chest tightness but none since the potassium supplementation was added.  Her multiple sclerosis is doing much better since she is retired. Her husband is a Land from [place] who now owns a Education administrator.  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    Past Medical History  Diagnosis Date  . PULMONARY EMBOLISM 11/2006    post op  . DEEP VENOUS THROMBOPHLEBITIS 11/2006    post surg, anticoag x 6 mo  . DIABETES MELLITUS, TYPE II     Dr. Buddy Duty  . GERD   . HYPERLIPIDEMIA   . HYPERTENSION   . NEUROPATHY     related to MS  . HYPOTHYROIDISM     Dr. Buddy Duty  . MENOPAUSAL SYNDROME   . Multiple sclerosis ([place])     Dr. Jacqulynn Cadet (in [place])  . SINUS TARSI SYNDROME   . IBS   . TRANSAMINASES, SERUM, ELEVATED     Dr. Carlean Purl, referred to [facility]- FATTY LIVER  . Complication of anesthesia     difficulty waking  . TUBULOVILLOUS ADENOMA, COLON, HX OF 09/21/2008    Annotation: cecum, with high-grade dysplasia original colonoscopy [place] resected by Dr. Johney Maine post-op leak and complicated course Qualifier: Diagnosis of  By: Carlean Purl MD, Dimas Millin   . Gout   . Fatty liver disease, nonalcoholic 0000000    fibrosis noted 2016; involved in study through [facility]  . PONV (postoperative nausea and vomiting)     needs scop patch  . Wears glasses   . HSV-1 (herpes simplex virus 1) infection     (sores below nose)--very rare  .  Chronic diarrhea     (since ileocecal valve removed 2008)  . Mitochondrial disease 2016    followed at [facility]    Past Surgical History  Procedure Laterality Date  . Laraoscopic cecal polyp resection  11/2006    Right  . Ex lap and ileostomy/hartmanns pouch for anatamotic leak  11/2006  . Ileostomy reversal  04/2007  . Cesarean section      x's 2  . Abdominoplasty  2004  . Thyroidectomy, partial  1990    right  . Colonoscopy      multiple  . Colonoscopy  11/05/2011    Procedure: COLONOSCOPY;  Surgeon: Gatha Mayer, MD;  Location: [facility];  Service: Endoscopy;  Laterality: N/A;  . Cholecystectomy  2004    lap choli  . Tonsillectomy    . Mass excision Left 07/04/2013    Procedure: LEFT PALM EXCISION MASS;  Surgeon: Tennis Must, MD;  Location: [facility];  Service: Orthopedics;  Laterality: Left;    Outpatient Prescriptions Prior to Visit  Medication Sig Dispense Refill  . aspirin EC 81 MG EC tablet Take 1 tablet (81 mg total) by mouth daily.    . Coenzyme Q10 200 MG capsule Take 200 mg by mouth 2 (two) times daily.    Marland Kitchen  fish oil-omega-3 fatty acids 1000 MG capsule Take 4 g by mouth daily.     Marland Kitchen levothyroxine (SYNTHROID, LEVOTHROID) 137 MCG tablet Take 137 mcg by mouth daily.    Marland Kitchen losartan-hydrochlorothiazide (HYZAAR) 50-12.5 MG tablet TAKE 1 TABLET BY MOUTH DAILY. 90 tablet 0  . metFORMIN (GLUCOPHAGE) 500 MG tablet Take 1,000 mg by mouth 2 (two) times daily with a meal.     . Multiple Vitamin (MULTIVITAMIN) tablet Take 1 tablet by mouth daily.      Marland Kitchen omeprazole (PRILOSEC) 20 MG capsule TAKE ONE CAPSULE BY MOUTH EVERY DAY 90 capsule 0  . Potassium 99 MG TABS Take 1 tablet by mouth daily.     . pravastatin (PRAVACHOL) 80 MG tablet TAKE 1 TABLET BY MOUTH EVERY DAY 90 tablet 0  . propranolol ER (INDERAL LA) 120 MG 24 hr capsule TAKE ONE CAPSULE BY MOUTH EVERY DAY 90 capsule 1  . TRUEPLUS LANCETS 28G MISC by Does not apply route as needed.      . TRUETEST TEST test  strip USE TWICE DAILY TO CHECK BLOOD SUGARS 200 each 1  . pravastatin (PRAVACHOL) 80 MG tablet TAKE 1 TABLET BY MOUTH EVERY DAY 90 tablet 0  . propranolol ER (INDERAL LA) 120 MG 24 hr capsule TAKE ONE CAPSULE BY MOUTH DAILY 90 capsule 1   No facility-administered medications prior to visit.     Allergies:   Ativan   Social History   Social History  . Marital Status: Married    Spouse Name: N/A  . Number of Children: 3  . Years of Education: N/A   Occupational History  . disabled from [place] (was a Mudlogger of primary care offices) [place] Topics  . Smoking status: Never Smoker   . Smokeless tobacco: Never Used  . Alcohol Use: No  . Drug Use: No  . Sexual Activity: Not Asked   Other Topics Concern  . None   Social History Narrative   Lives with husband, 1 dog.  Daughter Larene Beach) lives in [place].  Son in [place] and son in [place]. 4 grandchildren total (2 here in [place])     Family History:  The patient's family history includes Breast cancer (age of onset: 21) in her mother; Colon cancer (age of onset: 52) in her mother; Deep vein thrombosis in her mother; Diabetes in her maternal grandmother and paternal grandmother; Pulmonary embolism in her mother; Stomach cancer in her father. There is no history of Esophageal cancer or Rectal cancer.   ROS:   Please see the history of present illness.    ROS All other systems reviewed and are negative.   PHYSICAL EXAM:   VS:  BP 140/84 mmHg  Pulse 68  Ht 5\' 5"  (1.651 m)  Wt 196 lb (88.905 kg)  BMI 32.62 kg/m2   GEN: Well nourished, well developed, in no acute distress HEENT: normal Neck: no JVD, carotid bruits, or masses Cardiac: RRR; 1/6 systolic, fairly holosystolic left sternal border murmur, no rubs, or gallops,no edema  Respiratory:  clear to auscultation bilaterally, normal work of breathing GI: soft, nontender, nondistended, + BS MS: no deformity or atrophy Skin: warm and dry, no rash Neuro:  Alert and  Oriented x 3, Strength and sensation are intact Psych: euthymic mood, full affect  Wt Readings from Last 3 Encounters:  09/13/15 196 lb (88.905 kg)  04/05/15 189 lb 9.6 oz (86.002 kg)  02/02/15 183 lb 12.8 oz (83.371 kg)  Studies/Labs Reviewed:   EKG:  EKG is not ordered today.  02/01/15-sinus rhythm, poor R-wave progression, no other significant abnormalities. Personally viewed.  Recent Labs: 04/05/2015: ALT 51*; BUN 7; Creat 0.61; Hemoglobin 13.4; Platelets 250; Potassium 4.0; Sodium 142   Lipid Panel    Component Value Date/Time   CHOL 128 04/05/2015 0001   TRIG 165* 04/05/2015 0001   TRIG 199 04/03/2010   HDL 42* 04/05/2015 0001   CHOLHDL 3.0 04/05/2015 0001   VLDL 33* 04/05/2015 0001   LDLCALC 53 04/05/2015 0001   LDLDIRECT 111.6 10/21/2010 1116    Additional studies/ records that were reviewed today include:  Prior office notes, lab work, EKG reviewed    ASSESSMENT:    No diagnosis found.   PLAN:  In order of problems listed above:  New Heart murmur -We will check echocardiogram. Could be slightly worse tricuspid or mitral regurgitation. Hopefully mild. If more significant or worrisome, we will repeat echocardiogram in the future. If not, no need for further evaluation. Monitor clinically. If shortness of breath were to occur or if murmur became harsh, please let me know. We can always check again at that time.  Multiple sclerosis -Per primary team, neurology  Essential hypertension -Well controlled on current medications.  Hyperlipidemia -Pravastatin. Per primary team.    Medication Adjustments/Labs and Tests Ordered: Current medicines are reviewed at length with the patient today.  Concerns regarding medicines are outlined above.  Medication changes, Labs and Tests ordered today are listed in the Patient Instructions below. There are no Patient Instructions on file for this visit.     Bobby Rumpf, MD  09/13/2015 12:07 PM    [facility] [address], [place], [place]  [address] Phone: [phone]; Fax: [phone]

## 2015-09-13 NOTE — Patient Instructions (Signed)
Medication Instructions:  The current medical regimen is effective;  continue present plan and medications.  Testing/Procedures: Your physician has requested that you have an echocardiogram. Echocardiography is a painless test that uses sound waves to create images of your heart. It provides your doctor with information about the size and shape of your heart and how well your heart's chambers and valves are working. This procedure takes approximately one hour. There are no restrictions for this procedure.  Follow-Up: Follow up as needed after your testing.  If you need a refill on your cardiac medications before your next appointment, please call your pharmacy.  Thank you for choosing [facility]!!

## 2015-09-27 DIAGNOSIS — E1169 Type 2 diabetes mellitus with other specified complication: Secondary | ICD-10-CM | POA: Diagnosis not present

## 2015-09-27 DIAGNOSIS — E785 Hyperlipidemia, unspecified: Secondary | ICD-10-CM | POA: Diagnosis not present

## 2015-09-27 DIAGNOSIS — K7581 Nonalcoholic steatohepatitis (NASH): Secondary | ICD-10-CM | POA: Diagnosis not present

## 2015-09-28 ENCOUNTER — Ambulatory Visit (HOSPITAL_COMMUNITY): Payer: PPO | Attending: Internal Medicine

## 2015-09-28 ENCOUNTER — Other Ambulatory Visit: Payer: Self-pay

## 2015-09-28 ENCOUNTER — Other Ambulatory Visit (HOSPITAL_COMMUNITY): Payer: PPO

## 2015-09-28 DIAGNOSIS — E785 Hyperlipidemia, unspecified: Secondary | ICD-10-CM | POA: Diagnosis not present

## 2015-09-28 DIAGNOSIS — R011 Cardiac murmur, unspecified: Secondary | ICD-10-CM | POA: Diagnosis not present

## 2015-09-28 DIAGNOSIS — E119 Type 2 diabetes mellitus without complications: Secondary | ICD-10-CM | POA: Insufficient documentation

## 2015-09-28 DIAGNOSIS — I10 Essential (primary) hypertension: Secondary | ICD-10-CM | POA: Diagnosis not present

## 2015-10-04 ENCOUNTER — Telehealth: Payer: Self-pay | Admitting: Cardiology

## 2015-10-04 NOTE — Telephone Encounter (Signed)
New message   Pt is calling for the rn  To give ECHO results

## 2015-10-04 NOTE — Telephone Encounter (Signed)
Pt was called with the results of her echo.  She is aware Dr Marlou Porch has not reviewed these results and she will be called back with any other information/orders/recommendations once he does.

## 2015-10-08 DIAGNOSIS — E119 Type 2 diabetes mellitus without complications: Secondary | ICD-10-CM | POA: Diagnosis not present

## 2015-10-08 DIAGNOSIS — E872 Acidosis: Secondary | ICD-10-CM | POA: Diagnosis not present

## 2015-10-08 DIAGNOSIS — G35 Multiple sclerosis: Secondary | ICD-10-CM | POA: Diagnosis not present

## 2015-10-08 DIAGNOSIS — K740 Hepatic fibrosis: Secondary | ICD-10-CM | POA: Diagnosis not present

## 2015-10-08 DIAGNOSIS — K7581 Nonalcoholic steatohepatitis (NASH): Secondary | ICD-10-CM | POA: Diagnosis not present

## 2015-10-10 DIAGNOSIS — G35 Multiple sclerosis: Secondary | ICD-10-CM | POA: Diagnosis not present

## 2015-10-17 DIAGNOSIS — Z7984 Long term (current) use of oral hypoglycemic drugs: Secondary | ICD-10-CM | POA: Diagnosis not present

## 2015-10-17 DIAGNOSIS — I10 Essential (primary) hypertension: Secondary | ICD-10-CM | POA: Diagnosis not present

## 2015-10-17 DIAGNOSIS — E063 Autoimmune thyroiditis: Secondary | ICD-10-CM | POA: Diagnosis not present

## 2015-10-17 DIAGNOSIS — E119 Type 2 diabetes mellitus without complications: Secondary | ICD-10-CM | POA: Diagnosis not present

## 2015-10-17 DIAGNOSIS — E039 Hypothyroidism, unspecified: Secondary | ICD-10-CM | POA: Diagnosis not present

## 2015-10-31 DIAGNOSIS — R1031 Right lower quadrant pain: Secondary | ICD-10-CM | POA: Diagnosis not present

## 2015-12-17 DIAGNOSIS — E1169 Type 2 diabetes mellitus with other specified complication: Secondary | ICD-10-CM | POA: Diagnosis not present

## 2015-12-17 DIAGNOSIS — E785 Hyperlipidemia, unspecified: Secondary | ICD-10-CM | POA: Diagnosis not present

## 2015-12-17 DIAGNOSIS — E039 Hypothyroidism, unspecified: Secondary | ICD-10-CM | POA: Diagnosis not present

## 2016-01-10 DIAGNOSIS — Z6832 Body mass index (BMI) 32.0-32.9, adult: Secondary | ICD-10-CM | POA: Diagnosis not present

## 2016-01-10 DIAGNOSIS — Z1231 Encounter for screening mammogram for malignant neoplasm of breast: Secondary | ICD-10-CM | POA: Diagnosis not present

## 2016-01-10 DIAGNOSIS — Z01419 Encounter for gynecological examination (general) (routine) without abnormal findings: Secondary | ICD-10-CM | POA: Diagnosis not present

## 2016-02-01 DIAGNOSIS — R197 Diarrhea, unspecified: Secondary | ICD-10-CM | POA: Diagnosis not present

## 2016-02-01 DIAGNOSIS — R7989 Other specified abnormal findings of blood chemistry: Secondary | ICD-10-CM | POA: Diagnosis not present

## 2016-02-01 DIAGNOSIS — K591 Functional diarrhea: Secondary | ICD-10-CM | POA: Diagnosis not present

## 2016-02-01 DIAGNOSIS — K219 Gastro-esophageal reflux disease without esophagitis: Secondary | ICD-10-CM | POA: Diagnosis not present

## 2016-05-06 DIAGNOSIS — E1169 Type 2 diabetes mellitus with other specified complication: Secondary | ICD-10-CM | POA: Diagnosis not present

## 2016-05-06 DIAGNOSIS — B370 Candidal stomatitis: Secondary | ICD-10-CM | POA: Diagnosis not present

## 2016-05-06 DIAGNOSIS — E039 Hypothyroidism, unspecified: Secondary | ICD-10-CM | POA: Diagnosis not present

## 2016-05-06 DIAGNOSIS — G4700 Insomnia, unspecified: Secondary | ICD-10-CM | POA: Diagnosis not present

## 2016-05-06 DIAGNOSIS — Z1159 Encounter for screening for other viral diseases: Secondary | ICD-10-CM | POA: Diagnosis not present

## 2016-05-06 DIAGNOSIS — E785 Hyperlipidemia, unspecified: Secondary | ICD-10-CM | POA: Diagnosis not present

## 2016-05-07 DIAGNOSIS — R197 Diarrhea, unspecified: Secondary | ICD-10-CM | POA: Diagnosis not present

## 2016-05-08 DIAGNOSIS — E785 Hyperlipidemia, unspecified: Secondary | ICD-10-CM | POA: Diagnosis not present

## 2016-05-08 DIAGNOSIS — Z1159 Encounter for screening for other viral diseases: Secondary | ICD-10-CM | POA: Diagnosis not present

## 2016-05-08 DIAGNOSIS — E1169 Type 2 diabetes mellitus with other specified complication: Secondary | ICD-10-CM | POA: Diagnosis not present

## 2016-05-08 DIAGNOSIS — E039 Hypothyroidism, unspecified: Secondary | ICD-10-CM | POA: Diagnosis not present

## 2016-06-25 DIAGNOSIS — K740 Hepatic fibrosis: Secondary | ICD-10-CM | POA: Diagnosis not present

## 2016-06-25 DIAGNOSIS — K7581 Nonalcoholic steatohepatitis (NASH): Secondary | ICD-10-CM | POA: Diagnosis not present

## 2016-06-25 DIAGNOSIS — K760 Fatty (change of) liver, not elsewhere classified: Secondary | ICD-10-CM | POA: Diagnosis not present

## 2016-07-01 ENCOUNTER — Other Ambulatory Visit: Payer: Self-pay | Admitting: Psychiatry

## 2016-07-01 DIAGNOSIS — G35 Multiple sclerosis: Secondary | ICD-10-CM

## 2016-07-16 ENCOUNTER — Ambulatory Visit
Admission: RE | Admit: 2016-07-16 | Discharge: 2016-07-16 | Disposition: A | Discharge status: Home / Self Care | Payer: PPO | Source: Ambulatory Visit | Attending: Psychiatry | Admitting: Psychiatry

## 2016-07-16 DIAGNOSIS — G35 Multiple sclerosis: Secondary | ICD-10-CM | POA: Diagnosis not present

## 2016-07-16 MED ORDER — GADOBENATE DIMEGLUMINE 529 MG/ML IV SOLN
18.0000 mL | Freq: Once | INTRAVENOUS | Status: AC | PRN
  Administered 2016-07-16: 18 mL via INTRAVENOUS

## 2016-07-29 DIAGNOSIS — J208 Acute bronchitis due to other specified organisms: Secondary | ICD-10-CM | POA: Diagnosis not present

## 2016-07-29 DIAGNOSIS — R509 Fever, unspecified: Secondary | ICD-10-CM | POA: Diagnosis not present

## 2016-09-24 DIAGNOSIS — R609 Edema, unspecified: Secondary | ICD-10-CM | POA: Diagnosis not present

## 2016-09-24 DIAGNOSIS — K7581 Nonalcoholic steatohepatitis (NASH): Secondary | ICD-10-CM | POA: Diagnosis not present

## 2016-10-08 DIAGNOSIS — G35 Multiple sclerosis: Secondary | ICD-10-CM | POA: Diagnosis not present

## 2016-11-05 ENCOUNTER — Encounter: Payer: Self-pay | Admitting: Gynecology

## 2016-11-24 DIAGNOSIS — J40 Bronchitis, not specified as acute or chronic: Secondary | ICD-10-CM | POA: Diagnosis not present

## 2017-01-21 DIAGNOSIS — E8840 Mitochondrial metabolism disorder, unspecified: Secondary | ICD-10-CM | POA: Diagnosis not present

## 2017-01-21 DIAGNOSIS — E119 Type 2 diabetes mellitus without complications: Secondary | ICD-10-CM | POA: Diagnosis not present

## 2017-01-21 DIAGNOSIS — E872 Acidosis: Secondary | ICD-10-CM | POA: Diagnosis not present

## 2017-01-21 DIAGNOSIS — K740 Hepatic fibrosis: Secondary | ICD-10-CM | POA: Diagnosis not present

## 2017-01-21 DIAGNOSIS — K7581 Nonalcoholic steatohepatitis (NASH): Secondary | ICD-10-CM | POA: Diagnosis not present

## 2017-01-21 DIAGNOSIS — G35 Multiple sclerosis: Secondary | ICD-10-CM | POA: Diagnosis not present

## 2017-03-04 DIAGNOSIS — Z01419 Encounter for gynecological examination (general) (routine) without abnormal findings: Secondary | ICD-10-CM | POA: Diagnosis not present

## 2017-03-04 DIAGNOSIS — Z1231 Encounter for screening mammogram for malignant neoplasm of breast: Secondary | ICD-10-CM | POA: Diagnosis not present

## 2017-03-10 ENCOUNTER — Encounter: Payer: Self-pay | Admitting: Internal Medicine

## 2017-03-25 DIAGNOSIS — E119 Type 2 diabetes mellitus without complications: Secondary | ICD-10-CM | POA: Diagnosis not present

## 2017-03-25 DIAGNOSIS — H52223 Regular astigmatism, bilateral: Secondary | ICD-10-CM | POA: Diagnosis not present

## 2017-03-25 DIAGNOSIS — Z7984 Long term (current) use of oral hypoglycemic drugs: Secondary | ICD-10-CM | POA: Diagnosis not present

## 2017-03-25 DIAGNOSIS — H524 Presbyopia: Secondary | ICD-10-CM | POA: Diagnosis not present

## 2017-03-25 DIAGNOSIS — H5213 Myopia, bilateral: Secondary | ICD-10-CM | POA: Diagnosis not present

## 2017-03-25 DIAGNOSIS — H2513 Age-related nuclear cataract, bilateral: Secondary | ICD-10-CM | POA: Diagnosis not present

## 2017-05-29 DIAGNOSIS — H16223 Keratoconjunctivitis sicca, not specified as Sjogren's, bilateral: Secondary | ICD-10-CM | POA: Diagnosis not present

## 2017-06-17 DIAGNOSIS — M25775 Osteophyte, left foot: Secondary | ICD-10-CM | POA: Diagnosis not present

## 2017-06-17 DIAGNOSIS — L600 Ingrowing nail: Secondary | ICD-10-CM | POA: Diagnosis not present

## 2017-07-05 DIAGNOSIS — R3915 Urgency of urination: Secondary | ICD-10-CM | POA: Diagnosis not present

## 2017-07-14 DIAGNOSIS — G35 Multiple sclerosis: Secondary | ICD-10-CM | POA: Diagnosis not present

## 2017-07-14 DIAGNOSIS — E1169 Type 2 diabetes mellitus with other specified complication: Secondary | ICD-10-CM | POA: Diagnosis not present

## 2017-07-14 DIAGNOSIS — E8840 Mitochondrial metabolism disorder, unspecified: Secondary | ICD-10-CM | POA: Diagnosis not present

## 2017-07-14 DIAGNOSIS — I10 Essential (primary) hypertension: Secondary | ICD-10-CM | POA: Diagnosis not present

## 2017-07-14 DIAGNOSIS — G4700 Insomnia, unspecified: Secondary | ICD-10-CM | POA: Diagnosis not present

## 2017-07-14 DIAGNOSIS — E039 Hypothyroidism, unspecified: Secondary | ICD-10-CM | POA: Diagnosis not present

## 2017-07-14 DIAGNOSIS — Z1211 Encounter for screening for malignant neoplasm of colon: Secondary | ICD-10-CM | POA: Diagnosis not present

## 2017-07-14 DIAGNOSIS — E785 Hyperlipidemia, unspecified: Secondary | ICD-10-CM | POA: Diagnosis not present

## 2017-08-10 DIAGNOSIS — K740 Hepatic fibrosis: Secondary | ICD-10-CM | POA: Diagnosis not present

## 2017-08-10 DIAGNOSIS — K7581 Nonalcoholic steatohepatitis (NASH): Secondary | ICD-10-CM | POA: Diagnosis not present

## 2017-08-10 DIAGNOSIS — R197 Diarrhea, unspecified: Secondary | ICD-10-CM | POA: Diagnosis not present

## 2017-08-10 DIAGNOSIS — Z9049 Acquired absence of other specified parts of digestive tract: Secondary | ICD-10-CM | POA: Diagnosis not present

## 2017-08-11 DIAGNOSIS — Z961 Presence of intraocular lens: Secondary | ICD-10-CM | POA: Diagnosis not present

## 2017-08-11 DIAGNOSIS — H52223 Regular astigmatism, bilateral: Secondary | ICD-10-CM | POA: Diagnosis not present

## 2017-08-24 DIAGNOSIS — E872 Acidosis: Secondary | ICD-10-CM | POA: Diagnosis not present

## 2017-08-24 DIAGNOSIS — R6889 Other general symptoms and signs: Secondary | ICD-10-CM | POA: Diagnosis not present

## 2017-09-02 DIAGNOSIS — K6389 Other specified diseases of intestine: Secondary | ICD-10-CM | POA: Diagnosis not present

## 2017-09-02 DIAGNOSIS — R197 Diarrhea, unspecified: Secondary | ICD-10-CM | POA: Diagnosis not present

## 2017-09-03 DIAGNOSIS — R1013 Epigastric pain: Secondary | ICD-10-CM | POA: Diagnosis not present

## 2017-09-03 DIAGNOSIS — M549 Dorsalgia, unspecified: Secondary | ICD-10-CM | POA: Diagnosis not present

## 2017-09-26 DIAGNOSIS — R109 Unspecified abdominal pain: Secondary | ICD-10-CM | POA: Diagnosis not present

## 2017-09-26 DIAGNOSIS — I10 Essential (primary) hypertension: Secondary | ICD-10-CM | POA: Diagnosis not present

## 2017-09-26 DIAGNOSIS — G35 Multiple sclerosis: Secondary | ICD-10-CM | POA: Diagnosis not present

## 2017-09-26 DIAGNOSIS — Z86718 Personal history of other venous thrombosis and embolism: Secondary | ICD-10-CM | POA: Diagnosis not present

## 2017-09-26 DIAGNOSIS — R1031 Right lower quadrant pain: Secondary | ICD-10-CM | POA: Diagnosis not present

## 2017-09-26 DIAGNOSIS — R1030 Lower abdominal pain, unspecified: Secondary | ICD-10-CM | POA: Diagnosis not present

## 2017-09-27 DIAGNOSIS — E8840 Mitochondrial metabolism disorder, unspecified: Secondary | ICD-10-CM | POA: Diagnosis not present

## 2017-09-27 DIAGNOSIS — E872 Acidosis: Secondary | ICD-10-CM | POA: Diagnosis not present

## 2017-09-30 DIAGNOSIS — R1011 Right upper quadrant pain: Secondary | ICD-10-CM | POA: Diagnosis not present

## 2017-09-30 DIAGNOSIS — R160 Hepatomegaly, not elsewhere classified: Secondary | ICD-10-CM | POA: Diagnosis not present

## 2017-09-30 DIAGNOSIS — R110 Nausea: Secondary | ICD-10-CM | POA: Diagnosis not present

## 2017-09-30 DIAGNOSIS — M549 Dorsalgia, unspecified: Secondary | ICD-10-CM | POA: Diagnosis not present

## 2017-09-30 DIAGNOSIS — N281 Cyst of kidney, acquired: Secondary | ICD-10-CM | POA: Diagnosis not present

## 2017-09-30 DIAGNOSIS — L299 Pruritus, unspecified: Secondary | ICD-10-CM | POA: Diagnosis not present

## 2017-11-09 DIAGNOSIS — G35 Multiple sclerosis: Secondary | ICD-10-CM | POA: Diagnosis not present

## 2017-11-12 ENCOUNTER — Other Ambulatory Visit: Payer: Self-pay | Admitting: Psychiatry

## 2017-11-12 DIAGNOSIS — G35 Multiple sclerosis: Secondary | ICD-10-CM

## 2017-11-26 DIAGNOSIS — E785 Hyperlipidemia, unspecified: Secondary | ICD-10-CM | POA: Diagnosis not present

## 2017-11-26 DIAGNOSIS — E1169 Type 2 diabetes mellitus with other specified complication: Secondary | ICD-10-CM | POA: Diagnosis not present

## 2017-11-26 DIAGNOSIS — I10 Essential (primary) hypertension: Secondary | ICD-10-CM | POA: Diagnosis not present

## 2017-11-30 DIAGNOSIS — H16202 Unspecified keratoconjunctivitis, left eye: Secondary | ICD-10-CM | POA: Diagnosis not present

## 2017-12-01 ENCOUNTER — Ambulatory Visit
Admission: RE | Admit: 2017-12-01 | Discharge: 2017-12-01 | Disposition: A | Discharge status: Home / Self Care | Payer: PPO | Source: Ambulatory Visit | Attending: Psychiatry | Admitting: Psychiatry

## 2017-12-01 DIAGNOSIS — G35 Multiple sclerosis: Secondary | ICD-10-CM

## 2017-12-01 MED ORDER — GADOBENATE DIMEGLUMINE 529 MG/ML IV SOLN
18.0000 mL | Freq: Once | INTRAVENOUS | Status: AC | PRN
  Administered 2017-12-01: 18 mL via INTRAVENOUS

## 2017-12-06 DIAGNOSIS — G35 Multiple sclerosis: Secondary | ICD-10-CM | POA: Diagnosis not present

## 2017-12-06 DIAGNOSIS — L309 Dermatitis, unspecified: Secondary | ICD-10-CM | POA: Diagnosis not present

## 2017-12-06 DIAGNOSIS — R509 Fever, unspecified: Secondary | ICD-10-CM | POA: Diagnosis not present

## 2017-12-06 DIAGNOSIS — R05 Cough: Secondary | ICD-10-CM | POA: Diagnosis not present

## 2017-12-06 DIAGNOSIS — I10 Essential (primary) hypertension: Secondary | ICD-10-CM | POA: Diagnosis not present

## 2017-12-06 DIAGNOSIS — K7581 Nonalcoholic steatohepatitis (NASH): Secondary | ICD-10-CM | POA: Diagnosis not present

## 2017-12-06 DIAGNOSIS — J069 Acute upper respiratory infection, unspecified: Secondary | ICD-10-CM | POA: Diagnosis not present

## 2017-12-06 DIAGNOSIS — Z86718 Personal history of other venous thrombosis and embolism: Secondary | ICD-10-CM | POA: Diagnosis not present

## 2018-01-01 DIAGNOSIS — J0100 Acute maxillary sinusitis, unspecified: Secondary | ICD-10-CM | POA: Diagnosis not present

## 2018-01-01 DIAGNOSIS — I10 Essential (primary) hypertension: Secondary | ICD-10-CM | POA: Diagnosis not present

## 2018-03-23 DIAGNOSIS — J209 Acute bronchitis, unspecified: Secondary | ICD-10-CM | POA: Diagnosis not present

## 2018-03-27 DIAGNOSIS — R0981 Nasal congestion: Secondary | ICD-10-CM | POA: Diagnosis not present

## 2018-03-27 DIAGNOSIS — J069 Acute upper respiratory infection, unspecified: Secondary | ICD-10-CM | POA: Diagnosis not present

## 2018-03-27 DIAGNOSIS — I214 Non-ST elevation (NSTEMI) myocardial infarction: Secondary | ICD-10-CM | POA: Diagnosis not present

## 2018-03-27 DIAGNOSIS — R110 Nausea: Secondary | ICD-10-CM | POA: Diagnosis not present

## 2018-03-27 DIAGNOSIS — I371 Nonrheumatic pulmonary valve insufficiency: Secondary | ICD-10-CM | POA: Diagnosis not present

## 2018-03-27 DIAGNOSIS — R079 Chest pain, unspecified: Secondary | ICD-10-CM | POA: Diagnosis not present

## 2018-03-27 DIAGNOSIS — R0789 Other chest pain: Secondary | ICD-10-CM | POA: Diagnosis not present

## 2018-03-27 DIAGNOSIS — I340 Nonrheumatic mitral (valve) insufficiency: Secondary | ICD-10-CM | POA: Diagnosis not present

## 2018-03-27 DIAGNOSIS — K7581 Nonalcoholic steatohepatitis (NASH): Secondary | ICD-10-CM | POA: Diagnosis not present

## 2018-03-27 DIAGNOSIS — R6884 Jaw pain: Secondary | ICD-10-CM | POA: Diagnosis not present

## 2018-03-27 DIAGNOSIS — R05 Cough: Secondary | ICD-10-CM | POA: Diagnosis not present

## 2018-03-27 DIAGNOSIS — I10 Essential (primary) hypertension: Secondary | ICD-10-CM | POA: Diagnosis not present

## 2018-03-27 DIAGNOSIS — K219 Gastro-esophageal reflux disease without esophagitis: Secondary | ICD-10-CM | POA: Diagnosis not present

## 2018-03-27 DIAGNOSIS — R9389 Abnormal findings on diagnostic imaging of other specified body structures: Secondary | ICD-10-CM | POA: Diagnosis not present

## 2018-03-27 DIAGNOSIS — E1165 Type 2 diabetes mellitus with hyperglycemia: Secondary | ICD-10-CM | POA: Diagnosis not present

## 2018-03-27 DIAGNOSIS — J209 Acute bronchitis, unspecified: Secondary | ICD-10-CM | POA: Diagnosis not present

## 2018-03-27 DIAGNOSIS — J9801 Acute bronchospasm: Secondary | ICD-10-CM | POA: Diagnosis not present

## 2018-03-27 DIAGNOSIS — R231 Pallor: Secondary | ICD-10-CM | POA: Diagnosis not present

## 2018-03-28 DIAGNOSIS — E1165 Type 2 diabetes mellitus with hyperglycemia: Secondary | ICD-10-CM | POA: Diagnosis not present

## 2018-03-28 DIAGNOSIS — R6884 Jaw pain: Secondary | ICD-10-CM | POA: Diagnosis not present

## 2018-03-28 DIAGNOSIS — J9801 Acute bronchospasm: Secondary | ICD-10-CM | POA: Diagnosis not present

## 2018-03-28 DIAGNOSIS — J069 Acute upper respiratory infection, unspecified: Secondary | ICD-10-CM | POA: Diagnosis not present

## 2018-03-28 DIAGNOSIS — R0981 Nasal congestion: Secondary | ICD-10-CM | POA: Diagnosis not present

## 2018-03-28 DIAGNOSIS — J9811 Atelectasis: Secondary | ICD-10-CM | POA: Diagnosis not present

## 2018-03-28 DIAGNOSIS — J40 Bronchitis, not specified as acute or chronic: Secondary | ICD-10-CM | POA: Diagnosis not present

## 2018-03-28 DIAGNOSIS — R05 Cough: Secondary | ICD-10-CM | POA: Diagnosis not present

## 2018-03-28 DIAGNOSIS — J209 Acute bronchitis, unspecified: Secondary | ICD-10-CM | POA: Diagnosis not present

## 2018-03-28 DIAGNOSIS — R110 Nausea: Secondary | ICD-10-CM | POA: Diagnosis not present

## 2018-03-28 DIAGNOSIS — R0789 Other chest pain: Secondary | ICD-10-CM | POA: Diagnosis not present

## 2018-03-28 DIAGNOSIS — I10 Essential (primary) hypertension: Secondary | ICD-10-CM | POA: Diagnosis not present

## 2018-03-28 DIAGNOSIS — R9389 Abnormal findings on diagnostic imaging of other specified body structures: Secondary | ICD-10-CM | POA: Diagnosis not present

## 2018-03-28 DIAGNOSIS — R079 Chest pain, unspecified: Secondary | ICD-10-CM | POA: Diagnosis not present

## 2018-03-29 DIAGNOSIS — J9801 Acute bronchospasm: Secondary | ICD-10-CM | POA: Diagnosis not present

## 2018-03-29 DIAGNOSIS — J40 Bronchitis, not specified as acute or chronic: Secondary | ICD-10-CM | POA: Diagnosis not present

## 2018-03-29 DIAGNOSIS — R6884 Jaw pain: Secondary | ICD-10-CM | POA: Diagnosis not present

## 2018-03-29 DIAGNOSIS — J069 Acute upper respiratory infection, unspecified: Secondary | ICD-10-CM | POA: Diagnosis not present

## 2018-03-29 DIAGNOSIS — E1165 Type 2 diabetes mellitus with hyperglycemia: Secondary | ICD-10-CM | POA: Diagnosis not present

## 2018-03-29 DIAGNOSIS — I10 Essential (primary) hypertension: Secondary | ICD-10-CM | POA: Diagnosis not present

## 2018-03-29 DIAGNOSIS — R05 Cough: Secondary | ICD-10-CM | POA: Diagnosis not present

## 2018-03-29 DIAGNOSIS — R0789 Other chest pain: Secondary | ICD-10-CM | POA: Diagnosis not present

## 2018-03-29 DIAGNOSIS — R110 Nausea: Secondary | ICD-10-CM | POA: Diagnosis not present

## 2018-03-29 DIAGNOSIS — R0981 Nasal congestion: Secondary | ICD-10-CM | POA: Diagnosis not present

## 2018-03-29 DIAGNOSIS — R9389 Abnormal findings on diagnostic imaging of other specified body structures: Secondary | ICD-10-CM | POA: Diagnosis not present

## 2018-03-29 DIAGNOSIS — R079 Chest pain, unspecified: Secondary | ICD-10-CM | POA: Diagnosis not present

## 2018-03-29 DIAGNOSIS — J209 Acute bronchitis, unspecified: Secondary | ICD-10-CM | POA: Diagnosis not present

## 2018-03-29 DIAGNOSIS — J9811 Atelectasis: Secondary | ICD-10-CM | POA: Diagnosis not present

## 2018-04-01 DIAGNOSIS — R918 Other nonspecific abnormal finding of lung field: Secondary | ICD-10-CM | POA: Diagnosis not present

## 2018-04-01 DIAGNOSIS — Z09 Encounter for follow-up examination after completed treatment for conditions other than malignant neoplasm: Secondary | ICD-10-CM | POA: Diagnosis not present

## 2018-04-01 DIAGNOSIS — J189 Pneumonia, unspecified organism: Secondary | ICD-10-CM | POA: Diagnosis not present

## 2018-04-12 DIAGNOSIS — J189 Pneumonia, unspecified organism: Secondary | ICD-10-CM | POA: Diagnosis not present

## 2018-04-12 DIAGNOSIS — R062 Wheezing: Secondary | ICD-10-CM | POA: Diagnosis not present

## 2018-04-12 DIAGNOSIS — J9811 Atelectasis: Secondary | ICD-10-CM | POA: Diagnosis not present

## 2018-04-19 DIAGNOSIS — Z23 Encounter for immunization: Secondary | ICD-10-CM | POA: Diagnosis not present

## 2018-04-19 DIAGNOSIS — F988 Other specified behavioral and emotional disorders with onset usually occurring in childhood and adolescence: Secondary | ICD-10-CM | POA: Diagnosis not present

## 2018-04-19 DIAGNOSIS — H16223 Keratoconjunctivitis sicca, not specified as Sjogren's, bilateral: Secondary | ICD-10-CM | POA: Diagnosis not present

## 2018-04-19 DIAGNOSIS — G35 Multiple sclerosis: Secondary | ICD-10-CM | POA: Diagnosis not present

## 2018-04-29 DIAGNOSIS — H16223 Keratoconjunctivitis sicca, not specified as Sjogren's, bilateral: Secondary | ICD-10-CM | POA: Diagnosis not present

## 2018-05-04 DIAGNOSIS — K769 Liver disease, unspecified: Secondary | ICD-10-CM | POA: Diagnosis not present

## 2018-05-04 DIAGNOSIS — K740 Hepatic fibrosis: Secondary | ICD-10-CM | POA: Diagnosis not present

## 2018-05-04 DIAGNOSIS — K760 Fatty (change of) liver, not elsewhere classified: Secondary | ICD-10-CM | POA: Diagnosis not present

## 2018-05-04 DIAGNOSIS — K7581 Nonalcoholic steatohepatitis (NASH): Secondary | ICD-10-CM | POA: Diagnosis not present

## 2018-05-07 DIAGNOSIS — Z7984 Long term (current) use of oral hypoglycemic drugs: Secondary | ICD-10-CM | POA: Diagnosis not present

## 2018-05-07 DIAGNOSIS — E119 Type 2 diabetes mellitus without complications: Secondary | ICD-10-CM | POA: Diagnosis not present

## 2018-05-07 DIAGNOSIS — H2513 Age-related nuclear cataract, bilateral: Secondary | ICD-10-CM | POA: Diagnosis not present

## 2018-05-12 DIAGNOSIS — Z1231 Encounter for screening mammogram for malignant neoplasm of breast: Secondary | ICD-10-CM | POA: Diagnosis not present

## 2018-05-12 DIAGNOSIS — Z01419 Encounter for gynecological examination (general) (routine) without abnormal findings: Secondary | ICD-10-CM | POA: Diagnosis not present

## 2018-05-12 DIAGNOSIS — Z1151 Encounter for screening for human papillomavirus (HPV): Secondary | ICD-10-CM | POA: Diagnosis not present

## 2018-06-02 DIAGNOSIS — J189 Pneumonia, unspecified organism: Secondary | ICD-10-CM | POA: Diagnosis not present

## 2018-07-05 DIAGNOSIS — J0101 Acute recurrent maxillary sinusitis: Secondary | ICD-10-CM | POA: Diagnosis not present

## 2018-07-07 DIAGNOSIS — E872 Acidosis: Secondary | ICD-10-CM | POA: Diagnosis not present

## 2018-07-07 DIAGNOSIS — E119 Type 2 diabetes mellitus without complications: Secondary | ICD-10-CM | POA: Diagnosis not present

## 2018-07-07 DIAGNOSIS — G35 Multiple sclerosis: Secondary | ICD-10-CM | POA: Diagnosis not present

## 2018-07-07 DIAGNOSIS — E8840 Mitochondrial metabolism disorder, unspecified: Secondary | ICD-10-CM | POA: Diagnosis not present

## 2018-07-07 DIAGNOSIS — K740 Hepatic fibrosis: Secondary | ICD-10-CM | POA: Diagnosis not present

## 2018-07-07 DIAGNOSIS — K7581 Nonalcoholic steatohepatitis (NASH): Secondary | ICD-10-CM | POA: Diagnosis not present

## 2018-07-08 DIAGNOSIS — L600 Ingrowing nail: Secondary | ICD-10-CM | POA: Diagnosis not present

## 2018-07-28 DIAGNOSIS — B370 Candidal stomatitis: Secondary | ICD-10-CM | POA: Diagnosis not present

## 2018-07-28 DIAGNOSIS — N898 Other specified noninflammatory disorders of vagina: Secondary | ICD-10-CM | POA: Diagnosis not present

## 2018-07-28 DIAGNOSIS — R109 Unspecified abdominal pain: Secondary | ICD-10-CM | POA: Diagnosis not present

## 2018-07-28 DIAGNOSIS — K7581 Nonalcoholic steatohepatitis (NASH): Secondary | ICD-10-CM | POA: Diagnosis not present

## 2018-07-28 DIAGNOSIS — G4700 Insomnia, unspecified: Secondary | ICD-10-CM | POA: Diagnosis not present

## 2018-08-12 DIAGNOSIS — B370 Candidal stomatitis: Secondary | ICD-10-CM | POA: Diagnosis not present

## 2018-08-12 DIAGNOSIS — L900 Lichen sclerosus et atrophicus: Secondary | ICD-10-CM | POA: Diagnosis not present

## 2018-08-12 DIAGNOSIS — B373 Candidiasis of vulva and vagina: Secondary | ICD-10-CM | POA: Diagnosis not present

## 2018-10-19 DIAGNOSIS — G35 Multiple sclerosis: Secondary | ICD-10-CM | POA: Diagnosis not present

## 2018-10-19 DIAGNOSIS — E8840 Mitochondrial metabolism disorder, unspecified: Secondary | ICD-10-CM | POA: Diagnosis not present

## 2018-10-19 DIAGNOSIS — E1169 Type 2 diabetes mellitus with other specified complication: Secondary | ICD-10-CM | POA: Diagnosis not present

## 2018-10-19 DIAGNOSIS — Z0000 Encounter for general adult medical examination without abnormal findings: Secondary | ICD-10-CM | POA: Diagnosis not present

## 2018-10-19 DIAGNOSIS — E785 Hyperlipidemia, unspecified: Secondary | ICD-10-CM | POA: Diagnosis not present

## 2018-12-28 DIAGNOSIS — E785 Hyperlipidemia, unspecified: Secondary | ICD-10-CM | POA: Diagnosis not present

## 2018-12-28 DIAGNOSIS — E1169 Type 2 diabetes mellitus with other specified complication: Secondary | ICD-10-CM | POA: Diagnosis not present

## 2018-12-28 DIAGNOSIS — M19041 Primary osteoarthritis, right hand: Secondary | ICD-10-CM | POA: Diagnosis not present

## 2018-12-28 DIAGNOSIS — M79641 Pain in right hand: Secondary | ICD-10-CM | POA: Diagnosis not present

## 2019-02-09 DIAGNOSIS — K740 Hepatic fibrosis: Secondary | ICD-10-CM | POA: Diagnosis not present

## 2019-02-09 DIAGNOSIS — K7581 Nonalcoholic steatohepatitis (NASH): Secondary | ICD-10-CM | POA: Diagnosis not present

## 2019-03-09 DIAGNOSIS — E1165 Type 2 diabetes mellitus with hyperglycemia: Secondary | ICD-10-CM | POA: Diagnosis not present

## 2019-03-17 DIAGNOSIS — T1512XA Foreign body in conjunctival sac, left eye, initial encounter: Secondary | ICD-10-CM | POA: Diagnosis not present

## 2019-03-17 DIAGNOSIS — H5712 Ocular pain, left eye: Secondary | ICD-10-CM | POA: Diagnosis not present

## 2019-03-20 DIAGNOSIS — H02845 Edema of left lower eyelid: Secondary | ICD-10-CM | POA: Diagnosis not present

## 2019-03-21 DIAGNOSIS — H5712 Ocular pain, left eye: Secondary | ICD-10-CM | POA: Diagnosis not present

## 2019-03-24 DIAGNOSIS — E1169 Type 2 diabetes mellitus with other specified complication: Secondary | ICD-10-CM | POA: Diagnosis not present

## 2019-03-24 DIAGNOSIS — E785 Hyperlipidemia, unspecified: Secondary | ICD-10-CM | POA: Diagnosis not present

## 2019-03-24 DIAGNOSIS — I10 Essential (primary) hypertension: Secondary | ICD-10-CM | POA: Diagnosis not present

## 2019-04-12 DIAGNOSIS — K7581 Nonalcoholic steatohepatitis (NASH): Secondary | ICD-10-CM | POA: Diagnosis not present

## 2019-04-12 DIAGNOSIS — K7400 Hepatic fibrosis, unspecified: Secondary | ICD-10-CM | POA: Diagnosis not present

## 2019-04-18 DIAGNOSIS — Z1329 Encounter for screening for other suspected endocrine disorder: Secondary | ICD-10-CM | POA: Diagnosis not present

## 2019-04-20 DIAGNOSIS — D3132 Benign neoplasm of left choroid: Secondary | ICD-10-CM | POA: Diagnosis not present

## 2019-04-20 DIAGNOSIS — H2513 Age-related nuclear cataract, bilateral: Secondary | ICD-10-CM | POA: Diagnosis not present

## 2019-04-20 DIAGNOSIS — E119 Type 2 diabetes mellitus without complications: Secondary | ICD-10-CM | POA: Diagnosis not present

## 2019-04-28 DIAGNOSIS — H52223 Regular astigmatism, bilateral: Secondary | ICD-10-CM | POA: Diagnosis not present

## 2019-04-28 DIAGNOSIS — H524 Presbyopia: Secondary | ICD-10-CM | POA: Diagnosis not present

## 2019-05-05 DIAGNOSIS — N1831 Chronic kidney disease, stage 3a: Secondary | ICD-10-CM | POA: Diagnosis not present

## 2019-05-05 DIAGNOSIS — E785 Hyperlipidemia, unspecified: Secondary | ICD-10-CM | POA: Diagnosis not present

## 2019-05-05 DIAGNOSIS — E1169 Type 2 diabetes mellitus with other specified complication: Secondary | ICD-10-CM | POA: Diagnosis not present

## 2019-06-01 DIAGNOSIS — D3132 Benign neoplasm of left choroid: Secondary | ICD-10-CM | POA: Diagnosis not present

## 2019-06-02 DIAGNOSIS — R109 Unspecified abdominal pain: Secondary | ICD-10-CM | POA: Diagnosis not present

## 2019-06-09 DIAGNOSIS — R109 Unspecified abdominal pain: Secondary | ICD-10-CM | POA: Diagnosis not present

## 2019-07-12 DIAGNOSIS — Z01419 Encounter for gynecological examination (general) (routine) without abnormal findings: Secondary | ICD-10-CM | POA: Diagnosis not present

## 2019-07-12 DIAGNOSIS — Z1231 Encounter for screening mammogram for malignant neoplasm of breast: Secondary | ICD-10-CM | POA: Diagnosis not present

## 2019-07-18 DIAGNOSIS — G35 Multiple sclerosis: Secondary | ICD-10-CM | POA: Diagnosis not present

## 2019-07-25 DIAGNOSIS — E8840 Mitochondrial metabolism disorder, unspecified: Secondary | ICD-10-CM | POA: Diagnosis not present

## 2019-07-25 DIAGNOSIS — I10 Essential (primary) hypertension: Secondary | ICD-10-CM | POA: Diagnosis not present

## 2019-07-25 DIAGNOSIS — G35 Multiple sclerosis: Secondary | ICD-10-CM | POA: Diagnosis not present

## 2019-07-25 DIAGNOSIS — N1831 Chronic kidney disease, stage 3a: Secondary | ICD-10-CM | POA: Diagnosis not present

## 2019-07-25 DIAGNOSIS — E1169 Type 2 diabetes mellitus with other specified complication: Secondary | ICD-10-CM | POA: Diagnosis not present

## 2019-07-25 DIAGNOSIS — E785 Hyperlipidemia, unspecified: Secondary | ICD-10-CM | POA: Diagnosis not present

## 2019-07-25 DIAGNOSIS — E079 Disorder of thyroid, unspecified: Secondary | ICD-10-CM | POA: Diagnosis not present

## 2019-08-31 DIAGNOSIS — D3132 Benign neoplasm of left choroid: Secondary | ICD-10-CM | POA: Diagnosis not present

## 2019-11-01 DIAGNOSIS — E079 Disorder of thyroid, unspecified: Secondary | ICD-10-CM | POA: Diagnosis not present

## 2019-11-01 DIAGNOSIS — R1084 Generalized abdominal pain: Secondary | ICD-10-CM | POA: Diagnosis not present

## 2019-11-04 DIAGNOSIS — N950 Postmenopausal bleeding: Secondary | ICD-10-CM | POA: Diagnosis not present

## 2019-11-04 DIAGNOSIS — R1904 Left lower quadrant abdominal swelling, mass and lump: Secondary | ICD-10-CM | POA: Diagnosis not present

## 2019-11-07 DIAGNOSIS — K219 Gastro-esophageal reflux disease without esophagitis: Secondary | ICD-10-CM | POA: Diagnosis not present

## 2019-11-07 DIAGNOSIS — R197 Diarrhea, unspecified: Secondary | ICD-10-CM | POA: Diagnosis not present

## 2019-11-09 DIAGNOSIS — R160 Hepatomegaly, not elsewhere classified: Secondary | ICD-10-CM | POA: Diagnosis not present

## 2019-11-09 DIAGNOSIS — K7581 Nonalcoholic steatohepatitis (NASH): Secondary | ICD-10-CM | POA: Diagnosis not present

## 2019-11-09 DIAGNOSIS — K7400 Hepatic fibrosis, unspecified: Secondary | ICD-10-CM | POA: Diagnosis not present

## 2019-11-18 DIAGNOSIS — N950 Postmenopausal bleeding: Secondary | ICD-10-CM | POA: Diagnosis not present

## 2019-11-30 ENCOUNTER — Other Ambulatory Visit: Payer: Self-pay | Admitting: Obstetrics and Gynecology

## 2019-12-06 ENCOUNTER — Encounter (HOSPITAL_BASED_OUTPATIENT_CLINIC_OR_DEPARTMENT_OTHER): Payer: Self-pay | Admitting: Obstetrics and Gynecology

## 2019-12-06 ENCOUNTER — Other Ambulatory Visit: Payer: Self-pay

## 2019-12-06 NOTE — Progress Notes (Signed)
Spoke w/ via phone for pre-op interview--- [name] Lab needs dos----  NONE            Lab results------ Pending drawn 12/09/19 COVID test ------12/09/19 Arrive at -------1115 NPO after ------MN Medications to take morning of surgery ----- Synthroid, and Inderal Diabetic medication ----- NONE Patient Special Instructions ----- Pre-Op special Istructions ----- Patient verbalized understanding of instructions that were given at this phone interview. Patient denies shortness of breath, chest pain, fever, cough a this phone interview.

## 2019-12-09 ENCOUNTER — Encounter (HOSPITAL_COMMUNITY)
Admission: RE | Admit: 2019-12-09 | Discharge: 2019-12-09 | Disposition: A | Discharge status: Home / Self Care | Payer: PPO | Source: Ambulatory Visit | Attending: Obstetrics and Gynecology | Admitting: Obstetrics and Gynecology

## 2019-12-09 ENCOUNTER — Other Ambulatory Visit: Payer: Self-pay

## 2019-12-09 ENCOUNTER — Other Ambulatory Visit (HOSPITAL_COMMUNITY)
Admission: RE | Admit: 2019-12-09 | Discharge: 2019-12-09 | Disposition: A | Discharge status: Home / Self Care | Payer: PPO | Source: Ambulatory Visit | Attending: Obstetrics and Gynecology | Admitting: Obstetrics and Gynecology

## 2019-12-09 DIAGNOSIS — Z20822 Contact with and (suspected) exposure to covid-19: Secondary | ICD-10-CM | POA: Insufficient documentation

## 2019-12-09 DIAGNOSIS — Z01812 Encounter for preprocedural laboratory examination: Secondary | ICD-10-CM | POA: Insufficient documentation

## 2019-12-09 LAB — BASIC METABOLIC PANEL
Anion gap: 9 (ref 5–15)
BUN: 12 mg/dL (ref 8–23)
CO2: 28 mmol/L (ref 22–32)
Calcium: 9.5 mg/dL (ref 8.9–10.3)
Chloride: 103 mmol/L (ref 98–111)
Creatinine, Ser: 0.66 mg/dL (ref 0.44–1.00)
GFR calc Af Amer: >60 mL/min (ref >60)
GFR calc non Af Amer: >60 mL/min (ref >60)
Glucose, Bld: 139 mg/dL — ABNORMAL HIGH (ref 70–99)
Potassium: 4.1 mmol/L (ref 3.5–5.1)
Sodium: 140 mmol/L (ref 135–145)

## 2019-12-09 LAB — CBC
HCT: 40.7 % (ref 36.0–46.0)
Hemoglobin: 13.6 g/dL (ref 12.0–15.0)
MCH: 31.5 pg (ref 26.0–34.0)
MCHC: 33.4 g/dL (ref 30.0–36.0)
MCV: 94.2 fL (ref 80.0–100.0)
Platelets: 238 10*3/uL (ref 150–400)
RBC: 4.32 MIL/uL (ref 3.87–5.11)
RDW: 12.5 % (ref 11.5–15.5)
WBC: 5.2 10*3/uL (ref 4.0–10.5)
nRBC: 0 % (ref 0.0–0.2)

## 2019-12-09 NOTE — Progress Notes (Signed)
Spoke with Janett Billow zanetto pa ok to use ekg 11-09-2019 from [facility] for 12-13-2019 surgery.ekg placed on chart.

## 2019-12-10 LAB — SARS CORONAVIRUS 2 (TAT 6-24 HRS): SARS Coronavirus 2: NEGATIVE

## 2019-12-13 ENCOUNTER — Ambulatory Visit (HOSPITAL_BASED_OUTPATIENT_CLINIC_OR_DEPARTMENT_OTHER): Payer: PPO | Admitting: Anesthesiology

## 2019-12-13 ENCOUNTER — Other Ambulatory Visit: Payer: Self-pay

## 2019-12-13 ENCOUNTER — Encounter (HOSPITAL_BASED_OUTPATIENT_CLINIC_OR_DEPARTMENT_OTHER)
Admission: RE | Disposition: A | Discharge status: Home / Self Care | Payer: Self-pay | Source: Home / Self Care | Attending: Obstetrics and Gynecology

## 2019-12-13 ENCOUNTER — Ambulatory Visit (HOSPITAL_BASED_OUTPATIENT_CLINIC_OR_DEPARTMENT_OTHER)
Admission: RE | Admit: 2019-12-13 | Discharge: 2019-12-13 | Disposition: A | Discharge status: Home / Self Care | Payer: PPO | Attending: Obstetrics and Gynecology | Admitting: Obstetrics and Gynecology

## 2019-12-13 ENCOUNTER — Encounter (HOSPITAL_BASED_OUTPATIENT_CLINIC_OR_DEPARTMENT_OTHER): Payer: Self-pay | Admitting: Obstetrics and Gynecology

## 2019-12-13 DIAGNOSIS — E785 Hyperlipidemia, unspecified: Secondary | ICD-10-CM | POA: Insufficient documentation

## 2019-12-13 DIAGNOSIS — Z803 Family history of malignant neoplasm of breast: Secondary | ICD-10-CM | POA: Diagnosis not present

## 2019-12-13 DIAGNOSIS — Z86010 Personal history of colonic polyps: Secondary | ICD-10-CM | POA: Insufficient documentation

## 2019-12-13 DIAGNOSIS — Z7984 Long term (current) use of oral hypoglycemic drugs: Secondary | ICD-10-CM | POA: Diagnosis not present

## 2019-12-13 DIAGNOSIS — Z833 Family history of diabetes mellitus: Secondary | ICD-10-CM | POA: Insufficient documentation

## 2019-12-13 DIAGNOSIS — K589 Irritable bowel syndrome without diarrhea: Secondary | ICD-10-CM | POA: Diagnosis not present

## 2019-12-13 DIAGNOSIS — K7581 Nonalcoholic steatohepatitis (NASH): Secondary | ICD-10-CM | POA: Diagnosis not present

## 2019-12-13 DIAGNOSIS — E890 Postprocedural hypothyroidism: Secondary | ICD-10-CM | POA: Insufficient documentation

## 2019-12-13 DIAGNOSIS — I10 Essential (primary) hypertension: Secondary | ICD-10-CM | POA: Diagnosis not present

## 2019-12-13 DIAGNOSIS — D259 Leiomyoma of uterus, unspecified: Secondary | ICD-10-CM | POA: Diagnosis not present

## 2019-12-13 DIAGNOSIS — N950 Postmenopausal bleeding: Secondary | ICD-10-CM | POA: Insufficient documentation

## 2019-12-13 DIAGNOSIS — N840 Polyp of corpus uteri: Secondary | ICD-10-CM | POA: Diagnosis not present

## 2019-12-13 DIAGNOSIS — Z888 Allergy status to other drugs, medicaments and biological substances status: Secondary | ICD-10-CM | POA: Insufficient documentation

## 2019-12-13 DIAGNOSIS — G35 Multiple sclerosis: Secondary | ICD-10-CM | POA: Diagnosis not present

## 2019-12-13 DIAGNOSIS — D250 Submucous leiomyoma of uterus: Secondary | ICD-10-CM | POA: Insufficient documentation

## 2019-12-13 DIAGNOSIS — Z9049 Acquired absence of other specified parts of digestive tract: Secondary | ICD-10-CM | POA: Insufficient documentation

## 2019-12-13 DIAGNOSIS — E119 Type 2 diabetes mellitus without complications: Secondary | ICD-10-CM | POA: Insufficient documentation

## 2019-12-13 DIAGNOSIS — Z800 Family history of malignant neoplasm of digestive organs: Secondary | ICD-10-CM | POA: Insufficient documentation

## 2019-12-13 DIAGNOSIS — K219 Gastro-esophageal reflux disease without esophagitis: Secondary | ICD-10-CM | POA: Diagnosis not present

## 2019-12-13 DIAGNOSIS — Z8249 Family history of ischemic heart disease and other diseases of the circulatory system: Secondary | ICD-10-CM | POA: Diagnosis not present

## 2019-12-13 DIAGNOSIS — Z86711 Personal history of pulmonary embolism: Secondary | ICD-10-CM | POA: Insufficient documentation

## 2019-12-13 DIAGNOSIS — Z79899 Other long term (current) drug therapy: Secondary | ICD-10-CM | POA: Diagnosis not present

## 2019-12-13 HISTORY — DX: Nonalcoholic steatohepatitis (NASH): K75.81

## 2019-12-13 HISTORY — PX: DILATATION & CURETTAGE/HYSTEROSCOPY WITH MYOSURE: SHX6511

## 2019-12-13 LAB — TYPE AND SCREEN
ABO/RH(D): O POS
Antibody Screen: NEGATIVE

## 2019-12-13 LAB — GLUCOSE, CAPILLARY
Glucose-Capillary: 119 mg/dL — ABNORMAL HIGH (ref 70–99)
Glucose-Capillary: 176 mg/dL — ABNORMAL HIGH (ref 70–99)

## 2019-12-13 SURGERY — DILATATION & CURETTAGE/HYSTEROSCOPY WITH MYOSURE
Anesthesia: Monitor Anesthesia Care | Site: Vagina

## 2019-12-13 MED ORDER — TRAMADOL HCL 50 MG PO TABS: 50.0000 mg | ORAL_TABLET | Freq: Four times a day (QID) | ORAL | 0 refills | Status: AC | PRN

## 2019-12-13 MED ORDER — VASOPRESSIN 20 UNIT/ML IV SOLN
INTRAVENOUS | Status: DC | PRN
  Administered 2019-12-13: 20 [IU] via SUBCUTANEOUS

## 2019-12-13 MED ORDER — CEFAZOLIN SODIUM-DEXTROSE 2-4 GM/100ML-% IV SOLN
INTRAVENOUS | Status: AC
  Filled 2019-12-13: qty 100

## 2019-12-13 MED ORDER — OXYCODONE HCL 5 MG/5ML PO SOLN: 5.0000 mg | Freq: Once | ORAL | Status: DC | PRN

## 2019-12-13 MED ORDER — KETOROLAC TROMETHAMINE 30 MG/ML IJ SOLN
INTRAMUSCULAR | Status: DC | PRN
  Administered 2019-12-13: 15 mg via INTRAVENOUS

## 2019-12-13 MED ORDER — CEFAZOLIN SODIUM-DEXTROSE 2-4 GM/100ML-% IV SOLN
2.0000 g | INTRAVENOUS | Status: AC
  Administered 2019-12-13: 2 g via INTRAVENOUS

## 2019-12-13 MED ORDER — SODIUM CHLORIDE 0.9 % IR SOLN
Status: DC | PRN
  Administered 2019-12-13 (×2): 6000 mL

## 2019-12-13 MED ORDER — LACTATED RINGERS IV SOLN: INTRAVENOUS | Status: DC

## 2019-12-13 MED ORDER — PROPOFOL 10 MG/ML IV BOLUS
INTRAVENOUS | Status: AC
  Filled 2019-12-13: qty 60

## 2019-12-13 MED ORDER — PROPOFOL 10 MG/ML IV BOLUS
INTRAVENOUS | Status: AC
  Filled 2019-12-13: qty 20

## 2019-12-13 MED ORDER — SODIUM CHLORIDE (PF) 0.9 % IJ SOLN
INTRAMUSCULAR | Status: DC | PRN
  Administered 2019-12-13: 18 mL

## 2019-12-13 MED ORDER — DEXAMETHASONE SODIUM PHOSPHATE 10 MG/ML IJ SOLN
INTRAMUSCULAR | Status: DC | PRN
  Administered 2019-12-13: 4 mg via INTRAVENOUS

## 2019-12-13 MED ORDER — LIDOCAINE 2% (20 MG/ML) 5 ML SYRINGE
INTRAMUSCULAR | Status: AC
  Filled 2019-12-13: qty 5

## 2019-12-13 MED ORDER — FENTANYL CITRATE (PF) 100 MCG/2ML IJ SOLN
INTRAMUSCULAR | Status: DC | PRN
  Administered 2019-12-13 (×4): 25 ug via INTRAVENOUS

## 2019-12-13 MED ORDER — PROPOFOL 500 MG/50ML IV EMUL
INTRAVENOUS | Status: DC | PRN
  Administered 2019-12-13: 120 ug/kg/min via INTRAVENOUS

## 2019-12-13 MED ORDER — BUPIVACAINE HCL (PF) 0.25 % IJ SOLN
INTRAMUSCULAR | Status: DC | PRN
  Administered 2019-12-13: 10 mL

## 2019-12-13 MED ORDER — ONDANSETRON HCL 4 MG/2ML IJ SOLN
INTRAMUSCULAR | Status: AC
  Filled 2019-12-13: qty 2

## 2019-12-13 MED ORDER — LIDOCAINE HCL (CARDIAC) PF 100 MG/5ML IV SOSY
PREFILLED_SYRINGE | INTRAVENOUS | Status: DC | PRN
  Administered 2019-12-13: 80 mg via INTRAVENOUS

## 2019-12-13 MED ORDER — MIDAZOLAM HCL 2 MG/2ML IJ SOLN
INTRAMUSCULAR | Status: AC
  Filled 2019-12-13: qty 2

## 2019-12-13 MED ORDER — ONDANSETRON HCL 4 MG/2ML IJ SOLN: 4.0000 mg | Freq: Once | INTRAMUSCULAR | Status: DC | PRN

## 2019-12-13 MED ORDER — FENTANYL CITRATE (PF) 100 MCG/2ML IJ SOLN
INTRAMUSCULAR | Status: AC
  Filled 2019-12-13: qty 2

## 2019-12-13 MED ORDER — MIDAZOLAM HCL 2 MG/2ML IJ SOLN
INTRAMUSCULAR | Status: DC | PRN
  Administered 2019-12-13 (×2): 1 mg via INTRAVENOUS

## 2019-12-13 MED ORDER — ONDANSETRON HCL 4 MG/2ML IJ SOLN
INTRAMUSCULAR | Status: DC | PRN
  Administered 2019-12-13: 4 mg via INTRAVENOUS

## 2019-12-13 MED ORDER — FENTANYL CITRATE (PF) 100 MCG/2ML IJ SOLN
25.0000 ug | INTRAMUSCULAR | Status: DC | PRN
  Administered 2019-12-13: 25 ug via INTRAVENOUS

## 2019-12-13 MED ORDER — OXYCODONE HCL 5 MG PO TABS: 5.0000 mg | ORAL_TABLET | Freq: Once | ORAL | Status: DC | PRN

## 2019-12-13 SURGICAL SUPPLY — 18 items
CATH ROBINSON RED A/P 16FR (CATHETERS) ×3 IMPLANT
DECANTER SPIKE VIAL GLASS SM (MISCELLANEOUS) ×4 IMPLANT
DEVICE MYOSURE LITE (MISCELLANEOUS) IMPLANT
DEVICE MYOSURE REACH (MISCELLANEOUS) ×4 IMPLANT
GLOVE BIO SURGEON STRL SZ7.5 (GLOVE) ×3 IMPLANT
GLOVE BIOGEL PI IND STRL 7.0 (GLOVE) ×1 IMPLANT
GLOVE BIOGEL PI INDICATOR 7.0 (GLOVE) ×2
GOWN STRL REUS W/TWL LRG LVL3 (GOWN DISPOSABLE) ×6 IMPLANT
IV NS IRRIG 3000ML ARTHROMATIC (IV SOLUTION) ×9 IMPLANT
KIT PROCEDURE FLUENT (KITS) ×3 IMPLANT
MYOSURE XL FIBROID (MISCELLANEOUS) ×3
NDL SPNL 22GX3.5 QUINCKE BK (NEEDLE) ×1 IMPLANT
NEEDLE SPNL 22GX3.5 QUINCKE BK (NEEDLE) ×3 IMPLANT
PACK VAGINAL MINOR WOMEN LF (CUSTOM PROCEDURE TRAY) ×3 IMPLANT
PAD OB MATERNITY 4.3X12.25 (PERSONAL CARE ITEMS) ×3 IMPLANT
SEAL CERVICAL OMNI LOK (ABLATOR) IMPLANT
SEAL ROD LENS SCOPE MYOSURE (ABLATOR) ×3 IMPLANT
SYSTEM TISS REMOVAL MYOSURE XL (MISCELLANEOUS) IMPLANT

## 2019-12-13 NOTE — Op Note (Signed)
NAME: [name], [name] MEDICAL RECORD [id number] ACCOUNT [id number] DATE OF BIRTH:07-Oct-1955 FACILITY: [facility] LOCATION: [facility] PHYSICIAN:Cato Liburd J. Ronita Hipps, MD  OPERATIVE REPORT  DATE OF PROCEDURE:  12/13/2019  PREOPERATIVE DIAGNOSIS:  Postmenopausal bleeding with structural defect.  POSTOPERATIVE DIAGNOSES:   1.  Postmenopausal bleeding with structural defect. 2.  Large submucous fibroid. 3.  Endometrial polyp.  SURGEON:  Brien Few, MD  ASSISTANT:  None.  ANESTHESIA:  MAC and local.  ESTIMATED BLOOD LOSS:  50 mL.  DRAINS:  None.  FLUID DEFICIT:  450 mL.  DISPOSITION:  The patient was taken to recovery in good condition.  SPECIMENS:  Endometrial curettings, endometrial polyp and large submucous fibroid fragments.  BRIEF OPERATIVE NOTE:  After being apprised of the risks of anesthesia, infection, bleeding, injury to surrounding organs, possible need for repair, delayed versus immediate complications including bowel and bladder, internal vessel injury, possible need  for repair, inability to prevent cancer and/or cure cancer with surgery,  the patient was brought to the operating room where she was administered  IV sedation without difficulty, prepped and draped in usual sterile fashion, catheterized until the  bladder was empty.  Exam under anesthesia reveals a small anteflexed uterus, no adnexal masses.  A dilute Pitressin solution was placed at 3 and 9 o'clock cervicovaginal junction and a dilute Marcaine solution was placed, standard paracervical block, 20  mL total.  Cervix easily dilated up to a 21 Pratt dilator.  Hysteroscope placed.  Visualization revealed a large posterior wall submucous fibroid and a sessile endometrial polyp extending from the right lateral posterior portion of the uterine wall.  At  this point, the MyoSure XL device was entered through the hysteroscope and resection in a piecemeal fashion was done of the fibroid in multiple passes over a  30-minute timeframe.  Endometrial polyp was resected as well.  D and C performed using sharp  curettage.  All specimens collected.  Fluid deficit as noted.  All instruments were removed.  Minimal bleeding noted.  The patient tolerated the procedure well, was awakened and transferred to recovery in good condition.  VN/NUANCE  D:12/13/2019 T:12/13/2019 JOB:[id number]

## 2019-12-13 NOTE — H&P (Signed)
[name] is an 64 y.o. female. PMB with SM fibroid for resection.   Pertinent Gynecological History: Menses: post-menopausal Bleeding: post menopausal bleeding Contraception: none DES exposure: denies Blood transfusions: none Sexually transmitted diseases: no past history Previous GYN Procedures: DNC  Last mammogram: normal Date: 2020 Last pap: normal Date: 2020 OB History: G3, P2   Menstrual History: Menarche age: 32 No LMP recorded. Patient is postmenopausal.    Past Medical History:  Diagnosis Date  . Chronic diarrhea    (since ileocecal valve removed 2008)  . Complication of anesthesia    difficulty waking  . DEEP VENOUS THROMBOPHLEBITIS 11/2006   post surg, anticoag x 6 mo  . DIABETES MELLITUS, TYPE II    Dr. Buddy Duty  . Fatty liver disease, nonalcoholic 11/29/6166   fibrosis noted 2016; involved in study through [facility]  . GERD   . Gout   . HSV-1 (herpes simplex virus 1) infection    (sores below nose)--very rare  . HYPERLIPIDEMIA   . HYPERTENSION   . HYPOTHYROIDISM    Dr. Buddy Duty  . IBS   . MENOPAUSAL SYNDROME   . 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 Multiple sclerosis ([place])    Dr. Jacqulynn Cadet (in [place])  . NASH (nonalcoholic steatohepatitis)   . NEUROPATHY    related to MS  . PONV (postoperative nausea and vomiting)    needs scop patch  . PULMONARY EMBOLISM 11/2006   post op  . SINUS TARSI SYNDROME   . TRANSAMINASES, SERUM, ELEVATED    Dr. Carlean Purl, referred to [facility]- FATTY LIVER  . TUBULOVILLOUS ADENOMA, COLON, HX OF 09/21/2008   Annotation: cecum, with high-grade dysplasia original colonoscopy [place] resected by Dr. Johney Maine post-op leak and complicated course Qualifier: Diagnosis of  By: Carlean Purl MD, Dimas Millin Wears glasses     Past Surgical History:  Procedure Laterality Date  . ABDOMINOPLASTY  2004  . CESAREAN SECTION     x's 2  . CHOLECYSTECTOMY  2004   lap choli  . COLONOSCOPY     multiple  . COLONOSCOPY  11/05/2011    Procedure: COLONOSCOPY;  Surgeon: Gatha Mayer, MD;  Location: [facility];  Service: Endoscopy;  Laterality: N/A;  . Ex Lap and Ileostomy/Hartmanns pouch for anatamotic leak  11/2006  . Ileostomy reversal  04/2007  . Laraoscopic cecal polyp resection  11/2006   Right  . MASS EXCISION Left 07/04/2013   Procedure: LEFT PALM EXCISION MASS;  Surgeon: Tennis Must, MD;  Location: [facility];  Service: Orthopedics;  Laterality: Left;  . THYROIDECTOMY, PARTIAL  1990   right  . TONSILLECTOMY      Family History  Problem Relation Age of Onset  . Breast cancer Mother 82  . Colon cancer Mother 9       cancerous polyp removed age 53  . Pulmonary embolism Mother        on warfarin x 30 years  . Deep vein thrombosis Mother        multiple/recurrent  . Stomach cancer Father   . Diabetes Maternal Grandmother   . Diabetes Paternal Grandmother   . Esophageal cancer Neg Hx   . Rectal cancer Neg Hx     Social History:  reports that she has never smoked. She has never used smokeless tobacco. She reports that she does not drink alcohol and does not use drugs.  Allergies:  Allergies  Allergen Reactions  . Ativan [Lorazepam] Swelling  Medications Prior to Admission  Medication Sig Dispense Refill Last Dose  . Coenzyme Q10 200 MG capsule Take 200 mg by mouth 2 (two) times daily.   12/12/2019 at Unknown time  . Dulaglutide (TRULICITY) 4.74 QV/9.5GL SOPN Inject into the skin.   Past Week at Unknown time  . fish oil-omega-3 fatty acids 1000 MG capsule Take 4 g by mouth daily.    Past Month at Unknown time  . levothyroxine (SYNTHROID, LEVOTHROID) 137 MCG tablet Take 100 mcg by mouth daily.    12/13/2019 at 0700  . losartan-hydrochlorothiazide (HYZAAR) 50-12.5 MG tablet TAKE 1 TABLET BY MOUTH DAILY. 90 tablet 0 12/12/2019 at Unknown time  . metFORMIN (GLUCOPHAGE) 500 MG tablet Take 1,000 mg by mouth 2 (two) times daily with a meal.    12/12/2019 at Unknown time  . Multiple Vitamin  (MULTIVITAMIN) tablet Take 1 tablet by mouth daily.     12/12/2019 at Unknown time  . Potassium 99 MG TABS Take 1 tablet by mouth daily.    12/12/2019 at Unknown time  . propranolol ER (INDERAL LA) 120 MG 24 hr capsule TAKE ONE CAPSULE BY MOUTH EVERY DAY 90 capsule 1 12/13/2019 at 0800  . TRUEPLUS LANCETS 28G MISC by Does not apply route as needed.       . TRUETEST TEST test strip USE TWICE DAILY TO CHECK BLOOD SUGARS (Patient taking differently: 1 each once a week. Every Sunday.) 200 each 1     Review of Systems  Constitutional: Negative.   All other systems reviewed and are negative.   Blood pressure (!) 164/88, pulse 64, temperature 98.5 F (36.9 C), temperature source Oral, resp. rate 15, height 5\' 4"  (1.626 m), weight 88 kg, SpO2 99 %. Physical Exam  Nursing note and vitals reviewed. Constitutional: She is oriented to person, place, and time.  HENT:  Head: Normocephalic and atraumatic.  Cardiovascular: Normal rate, regular rhythm, normal heart sounds and normal pulses.  Respiratory: Effort normal and breath sounds normal.  GI: Soft. Normal appearance and bowel sounds are normal.  Genitourinary:    Vulva normal.   Musculoskeletal:        General: Normal range of motion.     Cervical back: Normal range of motion and neck supple.  Neurological: She is alert and oriented to person, place, and time.  Skin: Skin is warm and dry.  Psychiatric: Her behavior is normal. Mood normal.    Results for orders placed or performed during the hospital encounter of 12/13/19 (from the past 24 hour(s))  Glucose, capillary     Status: Abnormal   Collection Time: 12/13/19 11:26 AM  Result Value Ref Range   Glucose-Capillary 119 (H) 70 - 99 mg/dL    No results found.  Assessment/Plan: PMB with structural defect Diag, HS, D&C with myosure Consent done.  [name] J 12/13/2019, 12:00 PM

## 2019-12-13 NOTE — Transfer of Care (Signed)
Immediate Anesthesia Transfer of Care Note  Patient: [name]  Procedure(s) Performed: DILATATION & CURETTAGE/HYSTEROSCOPY WITH MYOSURE (N/A Vagina )  Patient Location: PACU  Anesthesia Type:MAC  Level of Consciousness: awake, alert  and patient cooperative  Airway & Oxygen Therapy: Patient Spontanous Breathing and Patient connected to nasal cannula oxygen  Post-op Assessment: Report given to RN and Post -op Vital signs reviewed and stable  Post vital signs: Reviewed and stable  Last Vitals:  Vitals Value Taken Time  BP 146/87 12/13/19 1440  Temp    Pulse 79 12/13/19 1445  Resp 9 12/13/19 1445  SpO2 94 % 12/13/19 1445  Vitals shown include unvalidated device data.  Last Pain:  Vitals:   12/13/19 1056  TempSrc: Oral         Complications: No complications documented.

## 2019-12-13 NOTE — Discharge Instructions (Signed)
  Post Anesthesia Home Care Instructions  Activity: Get plenty of rest for the remainder of the day. A responsible adult should stay with you for 24 hours following the procedure.  For the next 24 hours, DO NOT: -Drive a car -Paediatric nurse -Drink alcoholic beverages -Take any medication unless instructed by your physician -Make any legal decisions or sign important papers.  Meals: Start with liquid foods such as gelatin or soup. Progress to regular foods as tolerated. Avoid greasy, spicy, heavy foods. If nausea and/or vomiting occur, drink only clear liquids until the nausea and/or vomiting subsides. Call your physician if vomiting continues.  Special Instructions/Symptoms: Your throat may feel dry or sore from the anesthesia or the breathing tube placed in your throat during surgery. If this causes discomfort, gargle with warm salt water. The discomfort should disappear within 24 hours.  If you had a scopolamine patch placed behind your ear for the management of post- operative nausea and/or vomiting:  1. The medication in the patch is effective for 72 hours, after which it should be removed.  Wrap patch in a tissue and discard in the trash. Wash hands thoroughly with soap and water. 2. You may remove the patch earlier than 72 hours if you experience unpleasant side effects which may include dry mouth, dizziness or visual disturbances. 3. Avoid touching the patch. Wash your hands with soap and water after contact with the patch.     DISCHARGE INSTRUCTIONS: HYSTEROSCOPY / ENDOMETRIAL ABLATION The following instructions have been prepared to help you care for yourself upon your return home.  May Remove Scop patch on or before  May take Ibuprofen after  8:30 PM  May take stool softner while taking narcotic pain medication to prevent constipation.  Drink plenty of water.  Personal hygiene: Marland Kitchen Use sanitary pads for vaginal drainage, not tampons. . Shower the day after your  procedure. . NO tub baths, pools or Jacuzzis for 2-3 weeks. . Wipe front to back after using the bathroom.  Activity and limitations: . Do NOT drive or operate any equipment for 24 hours. The effects of anesthesia are still present and drowsiness may result. . Do NOT rest in bed all day. . Walking is encouraged. . Walk up and down stairs slowly. . You may resume your normal activity in one to two days or as indicated by your physician. Sexual activity: NO intercourse for at least 2 weeks after the procedure, or as indicated by your Doctor.  Diet: Eat a light meal as desired this evening. You may resume your usual diet tomorrow.  Return to Work: You may resume your work activities in one to two days or as indicated by Marine scientist.  What to expect after your surgery: Expect to have vaginal bleeding/discharge for 2-3 days and spotting for up to 10 days. It is not unusual to have soreness for up to 1-2 weeks. You may have a slight burning sensation when you urinate for the first day. Mild cramps may continue for a couple of days. You may have a regular period in 2-6 weeks.  Call your doctor for any of the following: . Excessive vaginal bleeding or clotting, saturating and changing one pad every hour. . Inability to urinate 6 hours after discharge from hospital. . Pain not relieved by pain medication. . Fever of 100.4 F or greater. . Unusual vaginal discharge or odor

## 2019-12-13 NOTE — Anesthesia Preprocedure Evaluation (Signed)
Anesthesia Evaluation  Patient identified by MRN, date of birth, ID band Patient awake    Reviewed: Allergy & Precautions, NPO status , Patient's Chart, lab work & pertinent test results  History of Anesthesia Complications (+) PONVNegative for: history of anesthetic complications  Airway Mallampati: II  TM Distance: >3 FB Neck ROM: Full    Dental  (+) Teeth Intact   Pulmonary PE (remote, postop)   Pulmonary exam normal        Cardiovascular hypertension, + DVT  Normal cardiovascular exam     Neuro/Psych MS TIAnegative psych ROS   GI/Hepatic GERD  ,(+) Hepatitis - ([facility])  Endo/Other  diabetes, Type 2Hypothyroidism   Renal/GU negative Renal ROS  negative genitourinary   Musculoskeletal negative musculoskeletal ROS (+)   Abdominal   Peds  Hematology negative hematology ROS (+)   Anesthesia Other Findings   Reproductive/Obstetrics                            Anesthesia Physical Anesthesia Plan  ASA: III  Anesthesia Plan: MAC   Post-op Pain Management:    Induction: Intravenous  PONV Risk Score and Plan: 4 or greater and Midazolam, Treatment may vary due to age or medical condition, Propofol infusion, TIVA and Ondansetron  Airway Management Planned: Nasal Cannula, Simple Face Mask and Natural Airway  Additional Equipment: None  Intra-op Plan:   Post-operative Plan:   Informed Consent: I have reviewed the patients History and Physical, chart, labs and discussed the procedure including the risks, benefits and alternatives for the proposed anesthesia with the patient or authorized representative who has indicated his/her understanding and acceptance.     Dental advisory given  Plan Discussed with:   Anesthesia Plan Comments: (Discussed plan to start with MAC and switch to GA/TIVA if needed)       Anesthesia Quick Evaluation

## 2019-12-13 NOTE — Progress Notes (Signed)
Patient seen and examined. Consent witnessed and signed. No changes noted. Update completed. BP (!) 164/88   Pulse 64   Temp 98.5 F (36.9 C) (Oral)   Resp 15   Ht 5\' 4"  (1.626 m)   Wt 88 kg   SpO2 99%   BMI 33.30 kg/m   CBC    Component Value Date/Time   WBC 5.2 12/09/2019 0939   RBC 4.32 12/09/2019 0939   HGB 13.6 12/09/2019 0939   HCT 40.7 12/09/2019 0939   PLT 238 12/09/2019 0939   MCV 94.2 12/09/2019 0939   MCH 31.5 12/09/2019 0939   MCHC 33.4 12/09/2019 0939   RDW 12.5 12/09/2019 0939   LYMPHSABS 2.4 04/05/2015 0001   MONOABS 0.5 04/05/2015 0001   EOSABS 0.1 04/05/2015 0001   BASOSABS 0.0 04/05/2015 0001

## 2019-12-13 NOTE — Op Note (Signed)
12/13/2019  2:43 PM  PATIENT:  [name]  64 y.o. female  PRE-OPERATIVE DIAGNOSIS:  Postmenopausal Bleeding  POST-OPERATIVE DIAGNOSIS:  Postmenopausal Bleeding  PROCEDURE:  Procedure(s): DILATATION & CURETTAGE HYSTEROSCOPY WITH MYOSURE RESECTION OF ENDOMETRIAL POLYP AND LARGE SM FIBROID  SURGEON:  Surgeon(s): Brien Few, MD  ASSISTANTS: none   ANESTHESIA:   local and general  ESTIMATED BLOOD LOSS: MINIMAL   DRAINS: none   LOCAL MEDICATIONS USED:  MARCAINE    and Amount: 20 ml  SPECIMEN:  Source of Specimen:  EMC, POLYP, SM FIBROID  DISPOSITION OF SPECIMEN:  PATHOLOGY  COUNTS:  YES  DICTATION #: [id number]  PLAN OF CARE: DC HOME  PATIENT DISPOSITION:  PACU - hemodynamically stable.

## 2019-12-14 ENCOUNTER — Encounter (HOSPITAL_BASED_OUTPATIENT_CLINIC_OR_DEPARTMENT_OTHER): Payer: Self-pay | Admitting: Obstetrics and Gynecology

## 2019-12-14 LAB — SURGICAL PATHOLOGY

## 2019-12-14 NOTE — Anesthesia Postprocedure Evaluation (Signed)
Anesthesia Post Note  Patient: [name]  Procedure(s) Performed: DILATATION & CURETTAGE/HYSTEROSCOPY WITH MYOSURE (N/A Vagina )     Patient location during evaluation: PACU Anesthesia Type: MAC Level of consciousness: awake and alert Pain management: pain level controlled Vital Signs Assessment: post-procedure vital signs reviewed and stable Respiratory status: spontaneous breathing, nonlabored ventilation and respiratory function stable Cardiovascular status: blood pressure returned to baseline and stable Postop Assessment: no apparent nausea or vomiting Anesthetic complications: no   No complications documented.  Last Vitals:  Vitals:   12/13/19 1530 12/13/19 1615  BP: (!) 153/75 (!) 163/83  Pulse: 84 94  Resp: (!) 21 20  Temp:  36.6 C  SpO2: 93% 94%    Last Pain:  Vitals:   12/14/19 0956  TempSrc:   PainSc: 2                  Lidia Collum

## 2020-01-11 DIAGNOSIS — R21 Rash and other nonspecific skin eruption: Secondary | ICD-10-CM | POA: Diagnosis not present

## 2020-01-18 DIAGNOSIS — R319 Hematuria, unspecified: Secondary | ICD-10-CM | POA: Diagnosis not present

## 2020-01-18 DIAGNOSIS — N390 Urinary tract infection, site not specified: Secondary | ICD-10-CM | POA: Diagnosis not present

## 2020-01-18 DIAGNOSIS — R3915 Urgency of urination: Secondary | ICD-10-CM | POA: Diagnosis not present

## 2020-01-24 DIAGNOSIS — I10 Essential (primary) hypertension: Secondary | ICD-10-CM | POA: Diagnosis not present

## 2020-01-24 DIAGNOSIS — E1169 Type 2 diabetes mellitus with other specified complication: Secondary | ICD-10-CM | POA: Diagnosis not present

## 2020-01-24 DIAGNOSIS — E785 Hyperlipidemia, unspecified: Secondary | ICD-10-CM | POA: Diagnosis not present

## 2020-01-26 DIAGNOSIS — E785 Hyperlipidemia, unspecified: Secondary | ICD-10-CM | POA: Diagnosis not present

## 2020-01-26 DIAGNOSIS — I10 Essential (primary) hypertension: Secondary | ICD-10-CM | POA: Diagnosis not present

## 2020-01-26 DIAGNOSIS — E1169 Type 2 diabetes mellitus with other specified complication: Secondary | ICD-10-CM | POA: Diagnosis not present

## 2020-02-08 DIAGNOSIS — G35 Multiple sclerosis: Secondary | ICD-10-CM | POA: Diagnosis not present

## 2020-02-08 DIAGNOSIS — F988 Other specified behavioral and emotional disorders with onset usually occurring in childhood and adolescence: Secondary | ICD-10-CM | POA: Diagnosis not present

## 2020-02-08 DIAGNOSIS — D849 Immunodeficiency, unspecified: Secondary | ICD-10-CM | POA: Diagnosis not present

## 2020-02-08 DIAGNOSIS — E119 Type 2 diabetes mellitus without complications: Secondary | ICD-10-CM | POA: Diagnosis not present

## 2020-02-14 ENCOUNTER — Other Ambulatory Visit: Payer: Self-pay | Admitting: Psychiatry

## 2020-02-14 DIAGNOSIS — G35 Multiple sclerosis: Secondary | ICD-10-CM

## 2020-02-28 DIAGNOSIS — R011 Cardiac murmur, unspecified: Secondary | ICD-10-CM | POA: Diagnosis not present

## 2020-02-28 DIAGNOSIS — I10 Essential (primary) hypertension: Secondary | ICD-10-CM | POA: Diagnosis not present

## 2020-03-01 ENCOUNTER — Telehealth: Payer: Self-pay

## 2020-03-01 NOTE — Telephone Encounter (Signed)
NOTES ON FILE FROM [facility] [phone], SENT REFERRAL TO SCHEDULING 

## 2020-03-05 DIAGNOSIS — R011 Cardiac murmur, unspecified: Secondary | ICD-10-CM | POA: Diagnosis not present

## 2020-03-05 DIAGNOSIS — I10 Essential (primary) hypertension: Secondary | ICD-10-CM | POA: Diagnosis not present

## 2020-03-06 DIAGNOSIS — R319 Hematuria, unspecified: Secondary | ICD-10-CM | POA: Diagnosis not present

## 2020-03-06 DIAGNOSIS — E079 Disorder of thyroid, unspecified: Secondary | ICD-10-CM | POA: Diagnosis not present

## 2020-03-06 DIAGNOSIS — E1169 Type 2 diabetes mellitus with other specified complication: Secondary | ICD-10-CM | POA: Diagnosis not present

## 2020-03-06 DIAGNOSIS — I10 Essential (primary) hypertension: Secondary | ICD-10-CM | POA: Diagnosis not present

## 2020-03-06 DIAGNOSIS — E785 Hyperlipidemia, unspecified: Secondary | ICD-10-CM | POA: Diagnosis not present

## 2020-03-07 ENCOUNTER — Telehealth: Payer: Self-pay

## 2020-03-07 NOTE — Telephone Encounter (Signed)
NOTES ON FILE FROM [facility] [phone], SENT REFERRAL TO SCHEDULING

## 2020-03-15 DIAGNOSIS — N950 Postmenopausal bleeding: Secondary | ICD-10-CM | POA: Diagnosis not present

## 2020-03-19 DIAGNOSIS — L820 Inflamed seborrheic keratosis: Secondary | ICD-10-CM | POA: Diagnosis not present

## 2020-03-21 ENCOUNTER — Ambulatory Visit: Payer: PPO | Admitting: Cardiology

## 2020-05-10 DIAGNOSIS — E1169 Type 2 diabetes mellitus with other specified complication: Secondary | ICD-10-CM | POA: Diagnosis not present

## 2020-05-10 DIAGNOSIS — E785 Hyperlipidemia, unspecified: Secondary | ICD-10-CM | POA: Diagnosis not present

## 2020-05-10 DIAGNOSIS — E079 Disorder of thyroid, unspecified: Secondary | ICD-10-CM | POA: Diagnosis not present

## 2020-05-22 DIAGNOSIS — K7400 Hepatic fibrosis, unspecified: Secondary | ICD-10-CM | POA: Diagnosis not present

## 2020-05-22 DIAGNOSIS — K7581 Nonalcoholic steatohepatitis (NASH): Secondary | ICD-10-CM | POA: Diagnosis not present

## 2020-05-29 ENCOUNTER — Ambulatory Visit
Admission: RE | Admit: 2020-05-29 | Discharge: 2020-05-29 | Disposition: A | Discharge status: Home / Self Care | Payer: PPO | Source: Ambulatory Visit | Attending: Psychiatry | Admitting: Psychiatry

## 2020-05-29 ENCOUNTER — Other Ambulatory Visit: Payer: Self-pay

## 2020-05-29 DIAGNOSIS — G35 Multiple sclerosis: Secondary | ICD-10-CM | POA: Diagnosis not present

## 2020-05-29 MED ORDER — GADOBENATE DIMEGLUMINE 529 MG/ML IV SOLN
17.0000 mL | Freq: Once | INTRAVENOUS | Status: AC | PRN
  Administered 2020-05-29: 17 mL via INTRAVENOUS

## 2020-06-12 DIAGNOSIS — D3132 Benign neoplasm of left choroid: Secondary | ICD-10-CM | POA: Diagnosis not present

## 2020-06-12 DIAGNOSIS — E119 Type 2 diabetes mellitus without complications: Secondary | ICD-10-CM | POA: Diagnosis not present

## 2020-06-12 DIAGNOSIS — Z7984 Long term (current) use of oral hypoglycemic drugs: Secondary | ICD-10-CM | POA: Diagnosis not present

## 2020-06-12 DIAGNOSIS — H2513 Age-related nuclear cataract, bilateral: Secondary | ICD-10-CM | POA: Diagnosis not present

## 2020-07-05 DIAGNOSIS — E8840 Mitochondrial metabolism disorder, unspecified: Secondary | ICD-10-CM | POA: Diagnosis not present

## 2020-07-05 DIAGNOSIS — E785 Hyperlipidemia, unspecified: Secondary | ICD-10-CM | POA: Diagnosis not present

## 2020-07-05 DIAGNOSIS — E1169 Type 2 diabetes mellitus with other specified complication: Secondary | ICD-10-CM | POA: Diagnosis not present

## 2020-07-05 DIAGNOSIS — G35 Multiple sclerosis: Secondary | ICD-10-CM | POA: Diagnosis not present

## 2020-07-05 DIAGNOSIS — E2839 Other primary ovarian failure: Secondary | ICD-10-CM | POA: Diagnosis not present

## 2020-07-05 DIAGNOSIS — Z0000 Encounter for general adult medical examination without abnormal findings: Secondary | ICD-10-CM | POA: Diagnosis not present

## 2020-07-19 DIAGNOSIS — L738 Other specified follicular disorders: Secondary | ICD-10-CM | POA: Diagnosis not present

## 2020-07-19 DIAGNOSIS — R0989 Other specified symptoms and signs involving the circulatory and respiratory systems: Secondary | ICD-10-CM | POA: Diagnosis not present

## 2020-07-19 DIAGNOSIS — R509 Fever, unspecified: Secondary | ICD-10-CM | POA: Diagnosis not present

## 2020-07-20 DIAGNOSIS — R0989 Other specified symptoms and signs involving the circulatory and respiratory systems: Secondary | ICD-10-CM | POA: Diagnosis not present

## 2020-07-20 DIAGNOSIS — R509 Fever, unspecified: Secondary | ICD-10-CM | POA: Diagnosis not present

## 2020-07-23 DIAGNOSIS — R1310 Dysphagia, unspecified: Secondary | ICD-10-CM | POA: Diagnosis not present

## 2020-07-23 DIAGNOSIS — B029 Zoster without complications: Secondary | ICD-10-CM | POA: Diagnosis not present

## 2020-07-24 DIAGNOSIS — B0239 Other herpes zoster eye disease: Secondary | ICD-10-CM | POA: Diagnosis not present

## 2020-08-07 DIAGNOSIS — K219 Gastro-esophageal reflux disease without esophagitis: Secondary | ICD-10-CM | POA: Diagnosis not present

## 2020-08-07 DIAGNOSIS — U071 COVID-19: Secondary | ICD-10-CM | POA: Diagnosis not present

## 2020-08-07 DIAGNOSIS — I10 Essential (primary) hypertension: Secondary | ICD-10-CM | POA: Diagnosis not present

## 2020-08-07 DIAGNOSIS — R059 Cough, unspecified: Secondary | ICD-10-CM | POA: Diagnosis not present

## 2020-08-16 DIAGNOSIS — K219 Gastro-esophageal reflux disease without esophagitis: Secondary | ICD-10-CM | POA: Diagnosis not present

## 2020-08-16 DIAGNOSIS — K7581 Nonalcoholic steatohepatitis (NASH): Secondary | ICD-10-CM | POA: Diagnosis not present

## 2020-08-16 DIAGNOSIS — R1310 Dysphagia, unspecified: Secondary | ICD-10-CM | POA: Diagnosis not present

## 2020-08-16 DIAGNOSIS — K7400 Hepatic fibrosis, unspecified: Secondary | ICD-10-CM | POA: Diagnosis not present

## 2020-08-20 DIAGNOSIS — K3189 Other diseases of stomach and duodenum: Secondary | ICD-10-CM | POA: Diagnosis not present

## 2020-08-20 DIAGNOSIS — E119 Type 2 diabetes mellitus without complications: Secondary | ICD-10-CM | POA: Diagnosis not present

## 2020-08-20 DIAGNOSIS — K2950 Unspecified chronic gastritis without bleeding: Secondary | ICD-10-CM | POA: Diagnosis not present

## 2020-08-20 DIAGNOSIS — R1310 Dysphagia, unspecified: Secondary | ICD-10-CM | POA: Diagnosis not present

## 2020-09-11 DIAGNOSIS — Z888 Allergy status to other drugs, medicaments and biological substances status: Secondary | ICD-10-CM | POA: Diagnosis not present

## 2020-09-11 DIAGNOSIS — R079 Chest pain, unspecified: Secondary | ICD-10-CM | POA: Diagnosis not present

## 2020-09-11 DIAGNOSIS — R9431 Abnormal electrocardiogram [ECG] [EKG]: Secondary | ICD-10-CM | POA: Diagnosis not present

## 2020-09-11 DIAGNOSIS — Z79899 Other long term (current) drug therapy: Secondary | ICD-10-CM | POA: Diagnosis not present

## 2020-09-11 DIAGNOSIS — K219 Gastro-esophageal reflux disease without esophagitis: Secondary | ICD-10-CM | POA: Diagnosis not present

## 2020-09-11 DIAGNOSIS — E119 Type 2 diabetes mellitus without complications: Secondary | ICD-10-CM | POA: Diagnosis not present

## 2020-09-11 DIAGNOSIS — I10 Essential (primary) hypertension: Secondary | ICD-10-CM | POA: Diagnosis not present

## 2020-09-13 DIAGNOSIS — I10 Essential (primary) hypertension: Secondary | ICD-10-CM | POA: Diagnosis not present

## 2020-09-13 DIAGNOSIS — U099 Post covid-19 condition, unspecified: Secondary | ICD-10-CM | POA: Diagnosis not present

## 2020-09-13 DIAGNOSIS — Z09 Encounter for follow-up examination after completed treatment for conditions other than malignant neoplasm: Secondary | ICD-10-CM | POA: Diagnosis not present

## 2020-09-18 DIAGNOSIS — I10 Essential (primary) hypertension: Secondary | ICD-10-CM | POA: Diagnosis not present

## 2020-09-21 DIAGNOSIS — K219 Gastro-esophageal reflux disease without esophagitis: Secondary | ICD-10-CM | POA: Diagnosis not present

## 2020-09-21 DIAGNOSIS — R1310 Dysphagia, unspecified: Secondary | ICD-10-CM | POA: Diagnosis not present

## 2020-09-21 DIAGNOSIS — K7400 Hepatic fibrosis, unspecified: Secondary | ICD-10-CM | POA: Diagnosis not present

## 2020-09-21 DIAGNOSIS — K7581 Nonalcoholic steatohepatitis (NASH): Secondary | ICD-10-CM | POA: Diagnosis not present

## 2020-10-03 DIAGNOSIS — Z8739 Personal history of other diseases of the musculoskeletal system and connective tissue: Secondary | ICD-10-CM | POA: Diagnosis not present

## 2020-10-03 DIAGNOSIS — K219 Gastro-esophageal reflux disease without esophagitis: Secondary | ICD-10-CM | POA: Diagnosis not present

## 2020-10-03 DIAGNOSIS — R1310 Dysphagia, unspecified: Secondary | ICD-10-CM | POA: Diagnosis not present

## 2020-10-04 DIAGNOSIS — R1310 Dysphagia, unspecified: Secondary | ICD-10-CM | POA: Diagnosis not present

## 2020-10-04 DIAGNOSIS — E1169 Type 2 diabetes mellitus with other specified complication: Secondary | ICD-10-CM | POA: Diagnosis not present

## 2020-10-04 DIAGNOSIS — I10 Essential (primary) hypertension: Secondary | ICD-10-CM | POA: Diagnosis not present

## 2020-10-04 DIAGNOSIS — E785 Hyperlipidemia, unspecified: Secondary | ICD-10-CM | POA: Diagnosis not present

## 2020-10-08 DIAGNOSIS — Z01419 Encounter for gynecological examination (general) (routine) without abnormal findings: Secondary | ICD-10-CM | POA: Diagnosis not present

## 2020-10-08 DIAGNOSIS — Z1231 Encounter for screening mammogram for malignant neoplasm of breast: Secondary | ICD-10-CM | POA: Diagnosis not present

## 2020-10-08 DIAGNOSIS — Z01411 Encounter for gynecological examination (general) (routine) with abnormal findings: Secondary | ICD-10-CM | POA: Diagnosis not present

## 2020-10-08 DIAGNOSIS — Z6839 Body mass index (BMI) 39.0-39.9, adult: Secondary | ICD-10-CM | POA: Diagnosis not present

## 2020-10-08 DIAGNOSIS — Z124 Encounter for screening for malignant neoplasm of cervix: Secondary | ICD-10-CM | POA: Diagnosis not present

## 2020-10-15 DIAGNOSIS — E119 Type 2 diabetes mellitus without complications: Secondary | ICD-10-CM | POA: Diagnosis not present

## 2020-10-15 DIAGNOSIS — F988 Other specified behavioral and emotional disorders with onset usually occurring in childhood and adolescence: Secondary | ICD-10-CM | POA: Diagnosis not present

## 2020-10-15 DIAGNOSIS — D849 Immunodeficiency, unspecified: Secondary | ICD-10-CM | POA: Diagnosis not present

## 2020-10-15 DIAGNOSIS — G35 Multiple sclerosis: Secondary | ICD-10-CM | POA: Diagnosis not present

## 2020-10-25 DIAGNOSIS — R011 Cardiac murmur, unspecified: Secondary | ICD-10-CM | POA: Diagnosis not present

## 2020-10-25 DIAGNOSIS — I10 Essential (primary) hypertension: Secondary | ICD-10-CM | POA: Diagnosis not present

## 2020-10-25 DIAGNOSIS — R9431 Abnormal electrocardiogram [ECG] [EKG]: Secondary | ICD-10-CM | POA: Diagnosis not present

## 2020-11-06 DIAGNOSIS — E1169 Type 2 diabetes mellitus with other specified complication: Secondary | ICD-10-CM | POA: Diagnosis not present

## 2020-11-06 DIAGNOSIS — E079 Disorder of thyroid, unspecified: Secondary | ICD-10-CM | POA: Diagnosis not present

## 2020-11-06 DIAGNOSIS — E785 Hyperlipidemia, unspecified: Secondary | ICD-10-CM | POA: Diagnosis not present

## 2020-11-12 DIAGNOSIS — E039 Hypothyroidism, unspecified: Secondary | ICD-10-CM | POA: Diagnosis not present

## 2020-11-12 DIAGNOSIS — B977 Papillomavirus as the cause of diseases classified elsewhere: Secondary | ICD-10-CM | POA: Diagnosis not present

## 2020-11-12 DIAGNOSIS — E785 Hyperlipidemia, unspecified: Secondary | ICD-10-CM | POA: Diagnosis not present

## 2020-11-12 DIAGNOSIS — E781 Pure hyperglyceridemia: Secondary | ICD-10-CM | POA: Diagnosis not present

## 2020-11-12 DIAGNOSIS — E1169 Type 2 diabetes mellitus with other specified complication: Secondary | ICD-10-CM | POA: Diagnosis not present

## 2020-12-06 ENCOUNTER — Other Ambulatory Visit: Payer: Self-pay | Admitting: Gastroenterology

## 2020-12-06 DIAGNOSIS — K7581 Nonalcoholic steatohepatitis (NASH): Secondary | ICD-10-CM

## 2020-12-10 DIAGNOSIS — R931 Abnormal findings on diagnostic imaging of heart and coronary circulation: Secondary | ICD-10-CM | POA: Diagnosis not present

## 2020-12-10 DIAGNOSIS — E1169 Type 2 diabetes mellitus with other specified complication: Secondary | ICD-10-CM | POA: Diagnosis not present

## 2020-12-10 DIAGNOSIS — E785 Hyperlipidemia, unspecified: Secondary | ICD-10-CM | POA: Diagnosis not present

## 2020-12-10 DIAGNOSIS — K7400 Hepatic fibrosis, unspecified: Secondary | ICD-10-CM | POA: Diagnosis not present

## 2020-12-11 ENCOUNTER — Ambulatory Visit
Admission: RE | Admit: 2020-12-11 | Discharge: 2020-12-11 | Disposition: A | Discharge status: Home / Self Care | Payer: PPO | Source: Ambulatory Visit | Attending: Gastroenterology | Admitting: Gastroenterology

## 2020-12-11 DIAGNOSIS — K7581 Nonalcoholic steatohepatitis (NASH): Secondary | ICD-10-CM | POA: Diagnosis not present

## 2020-12-21 DIAGNOSIS — K7400 Hepatic fibrosis, unspecified: Secondary | ICD-10-CM | POA: Diagnosis not present

## 2020-12-21 DIAGNOSIS — K7581 Nonalcoholic steatohepatitis (NASH): Secondary | ICD-10-CM | POA: Diagnosis not present

## 2020-12-21 DIAGNOSIS — K760 Fatty (change of) liver, not elsewhere classified: Secondary | ICD-10-CM | POA: Diagnosis not present

## 2020-12-21 DIAGNOSIS — K769 Liver disease, unspecified: Secondary | ICD-10-CM | POA: Diagnosis not present

## 2020-12-21 DIAGNOSIS — K7689 Other specified diseases of liver: Secondary | ICD-10-CM | POA: Diagnosis not present

## 2020-12-30 DIAGNOSIS — R0989 Other specified symptoms and signs involving the circulatory and respiratory systems: Secondary | ICD-10-CM | POA: Diagnosis not present

## 2020-12-30 DIAGNOSIS — Z79899 Other long term (current) drug therapy: Secondary | ICD-10-CM | POA: Diagnosis not present

## 2020-12-30 DIAGNOSIS — E785 Hyperlipidemia, unspecified: Secondary | ICD-10-CM | POA: Diagnosis not present

## 2020-12-30 DIAGNOSIS — Z888 Allergy status to other drugs, medicaments and biological substances status: Secondary | ICD-10-CM | POA: Diagnosis not present

## 2020-12-30 DIAGNOSIS — E890 Postprocedural hypothyroidism: Secondary | ICD-10-CM | POA: Diagnosis not present

## 2020-12-30 DIAGNOSIS — Z7984 Long term (current) use of oral hypoglycemic drugs: Secondary | ICD-10-CM | POA: Diagnosis not present

## 2020-12-30 DIAGNOSIS — E1169 Type 2 diabetes mellitus with other specified complication: Secondary | ICD-10-CM | POA: Diagnosis not present

## 2020-12-30 DIAGNOSIS — K219 Gastro-esophageal reflux disease without esophagitis: Secondary | ICD-10-CM | POA: Diagnosis not present

## 2020-12-30 DIAGNOSIS — I10 Essential (primary) hypertension: Secondary | ICD-10-CM | POA: Diagnosis not present

## 2020-12-30 DIAGNOSIS — G35 Multiple sclerosis: Secondary | ICD-10-CM | POA: Diagnosis not present

## 2020-12-30 DIAGNOSIS — E876 Hypokalemia: Secondary | ICD-10-CM | POA: Diagnosis not present

## 2020-12-30 DIAGNOSIS — R079 Chest pain, unspecified: Secondary | ICD-10-CM | POA: Diagnosis not present

## 2021-01-02 ENCOUNTER — Other Ambulatory Visit: Payer: PPO

## 2021-01-03 DIAGNOSIS — E785 Hyperlipidemia, unspecified: Secondary | ICD-10-CM | POA: Diagnosis not present

## 2021-01-03 DIAGNOSIS — E782 Mixed hyperlipidemia: Secondary | ICD-10-CM | POA: Diagnosis not present

## 2021-01-03 DIAGNOSIS — R1310 Dysphagia, unspecified: Secondary | ICD-10-CM | POA: Diagnosis not present

## 2021-01-03 DIAGNOSIS — E1169 Type 2 diabetes mellitus with other specified complication: Secondary | ICD-10-CM | POA: Diagnosis not present

## 2021-01-03 DIAGNOSIS — E876 Hypokalemia: Secondary | ICD-10-CM | POA: Diagnosis not present

## 2021-01-03 DIAGNOSIS — I10 Essential (primary) hypertension: Secondary | ICD-10-CM | POA: Diagnosis not present

## 2021-01-06 DIAGNOSIS — R300 Dysuria: Secondary | ICD-10-CM | POA: Diagnosis not present

## 2021-01-07 DIAGNOSIS — R109 Unspecified abdominal pain: Secondary | ICD-10-CM | POA: Diagnosis not present

## 2021-01-09 DIAGNOSIS — K7581 Nonalcoholic steatohepatitis (NASH): Secondary | ICD-10-CM | POA: Diagnosis not present

## 2021-01-09 DIAGNOSIS — K7402 Hepatic fibrosis, advanced fibrosis: Secondary | ICD-10-CM | POA: Diagnosis not present

## 2021-01-10 DIAGNOSIS — L821 Other seborrheic keratosis: Secondary | ICD-10-CM | POA: Diagnosis not present

## 2021-01-10 DIAGNOSIS — L814 Other melanin hyperpigmentation: Secondary | ICD-10-CM | POA: Diagnosis not present

## 2021-01-10 DIAGNOSIS — D225 Melanocytic nevi of trunk: Secondary | ICD-10-CM | POA: Diagnosis not present

## 2021-01-10 DIAGNOSIS — L578 Other skin changes due to chronic exposure to nonionizing radiation: Secondary | ICD-10-CM | POA: Diagnosis not present

## 2021-01-10 DIAGNOSIS — L24A0 Irritant contact dermatitis due to friction or contact with body fluids, unspecified: Secondary | ICD-10-CM | POA: Diagnosis not present

## 2021-01-10 DIAGNOSIS — L650 Telogen effluvium: Secondary | ICD-10-CM | POA: Diagnosis not present

## 2021-02-07 DIAGNOSIS — G35 Multiple sclerosis: Secondary | ICD-10-CM | POA: Diagnosis not present

## 2021-02-07 DIAGNOSIS — E785 Hyperlipidemia, unspecified: Secondary | ICD-10-CM | POA: Diagnosis not present

## 2021-02-07 DIAGNOSIS — K219 Gastro-esophageal reflux disease without esophagitis: Secondary | ICD-10-CM | POA: Diagnosis not present

## 2021-02-07 DIAGNOSIS — K7581 Nonalcoholic steatohepatitis (NASH): Secondary | ICD-10-CM | POA: Diagnosis not present

## 2021-02-07 DIAGNOSIS — I10 Essential (primary) hypertension: Secondary | ICD-10-CM | POA: Diagnosis not present

## 2021-02-07 DIAGNOSIS — Z7984 Long term (current) use of oral hypoglycemic drugs: Secondary | ICD-10-CM | POA: Diagnosis not present

## 2021-02-07 DIAGNOSIS — E119 Type 2 diabetes mellitus without complications: Secondary | ICD-10-CM | POA: Diagnosis not present

## 2021-02-12 DIAGNOSIS — R1310 Dysphagia, unspecified: Secondary | ICD-10-CM | POA: Diagnosis not present

## 2021-02-14 DIAGNOSIS — I10 Essential (primary) hypertension: Secondary | ICD-10-CM | POA: Diagnosis not present

## 2021-02-14 DIAGNOSIS — R319 Hematuria, unspecified: Secondary | ICD-10-CM | POA: Diagnosis not present

## 2021-02-14 DIAGNOSIS — E1169 Type 2 diabetes mellitus with other specified complication: Secondary | ICD-10-CM | POA: Diagnosis not present

## 2021-02-14 DIAGNOSIS — E785 Hyperlipidemia, unspecified: Secondary | ICD-10-CM | POA: Diagnosis not present

## 2021-02-14 DIAGNOSIS — E039 Hypothyroidism, unspecified: Secondary | ICD-10-CM | POA: Diagnosis not present

## 2021-02-28 DIAGNOSIS — E039 Hypothyroidism, unspecified: Secondary | ICD-10-CM | POA: Diagnosis not present

## 2021-02-28 DIAGNOSIS — E1169 Type 2 diabetes mellitus with other specified complication: Secondary | ICD-10-CM | POA: Diagnosis not present

## 2021-02-28 DIAGNOSIS — E785 Hyperlipidemia, unspecified: Secondary | ICD-10-CM | POA: Diagnosis not present

## 2021-03-04 DIAGNOSIS — E785 Hyperlipidemia, unspecified: Secondary | ICD-10-CM | POA: Diagnosis not present

## 2021-03-04 DIAGNOSIS — E1169 Type 2 diabetes mellitus with other specified complication: Secondary | ICD-10-CM | POA: Diagnosis not present

## 2021-05-01 DIAGNOSIS — K7581 Nonalcoholic steatohepatitis (NASH): Secondary | ICD-10-CM | POA: Diagnosis not present

## 2021-05-01 DIAGNOSIS — K7400 Hepatic fibrosis, unspecified: Secondary | ICD-10-CM | POA: Diagnosis not present

## 2021-05-21 DIAGNOSIS — T189XXA Foreign body of alimentary tract, part unspecified, initial encounter: Secondary | ICD-10-CM | POA: Diagnosis not present

## 2021-05-21 DIAGNOSIS — K219 Gastro-esophageal reflux disease without esophagitis: Secondary | ICD-10-CM | POA: Diagnosis not present

## 2021-05-21 DIAGNOSIS — R1310 Dysphagia, unspecified: Secondary | ICD-10-CM | POA: Diagnosis not present

## 2021-05-21 DIAGNOSIS — R0989 Other specified symptoms and signs involving the circulatory and respiratory systems: Secondary | ICD-10-CM | POA: Diagnosis not present

## 2021-05-21 DIAGNOSIS — K7581 Nonalcoholic steatohepatitis (NASH): Secondary | ICD-10-CM | POA: Diagnosis not present

## 2021-05-31 DIAGNOSIS — R1310 Dysphagia, unspecified: Secondary | ICD-10-CM | POA: Diagnosis not present

## 2021-05-31 DIAGNOSIS — K2289 Other specified disease of esophagus: Secondary | ICD-10-CM | POA: Diagnosis not present

## 2021-08-06 ENCOUNTER — Other Ambulatory Visit: Payer: Self-pay | Admitting: Psychiatry

## 2021-08-06 DIAGNOSIS — G35 Multiple sclerosis: Secondary | ICD-10-CM

## 2021-09-26 ENCOUNTER — Other Ambulatory Visit: Payer: PPO

## 2021-10-09 ENCOUNTER — Ambulatory Visit
Admission: RE | Admit: 2021-10-09 | Discharge: 2021-10-09 | Disposition: A | Discharge status: Home / Self Care | Payer: PPO | Source: Ambulatory Visit | Attending: Psychiatry | Admitting: Psychiatry

## 2021-10-09 DIAGNOSIS — G35 Multiple sclerosis: Secondary | ICD-10-CM

## 2021-10-09 MED ORDER — GADOBENATE DIMEGLUMINE 529 MG/ML IV SOLN
17.0000 mL | Freq: Once | INTRAVENOUS | Status: AC | PRN
  Administered 2021-10-09: 17 mL via INTRAVENOUS

## 2022-07-03 DIAGNOSIS — E119 Type 2 diabetes mellitus without complications: Secondary | ICD-10-CM | POA: Diagnosis not present

## 2022-08-05 DIAGNOSIS — K7581 Nonalcoholic steatohepatitis (NASH): Secondary | ICD-10-CM | POA: Diagnosis not present

## 2022-08-05 DIAGNOSIS — K7400 Hepatic fibrosis, unspecified: Secondary | ICD-10-CM | POA: Diagnosis not present

## 2022-09-23 DIAGNOSIS — I10 Essential (primary) hypertension: Secondary | ICD-10-CM | POA: Diagnosis not present

## 2022-09-23 DIAGNOSIS — G35 Multiple sclerosis: Secondary | ICD-10-CM | POA: Diagnosis not present

## 2022-09-23 DIAGNOSIS — E785 Hyperlipidemia, unspecified: Secondary | ICD-10-CM | POA: Diagnosis not present

## 2022-09-23 DIAGNOSIS — E1169 Type 2 diabetes mellitus with other specified complication: Secondary | ICD-10-CM | POA: Diagnosis not present

## 2022-09-23 DIAGNOSIS — E8840 Mitochondrial metabolism disorder, unspecified: Secondary | ICD-10-CM | POA: Diagnosis not present

## 2022-10-02 ENCOUNTER — Other Ambulatory Visit (HOSPITAL_BASED_OUTPATIENT_CLINIC_OR_DEPARTMENT_OTHER): Payer: Self-pay

## 2022-10-02 MED ORDER — TRULICITY 4.5 MG/0.5ML ~~LOC~~ SOAJ
4.5000 mg | SUBCUTANEOUS | 2 refills | Status: AC
  Filled 2022-10-02: qty 2, 28d supply, fill #0

## 2022-10-08 DIAGNOSIS — K7581 Nonalcoholic steatohepatitis (NASH): Secondary | ICD-10-CM | POA: Diagnosis not present

## 2022-10-08 DIAGNOSIS — K7400 Hepatic fibrosis, unspecified: Secondary | ICD-10-CM | POA: Diagnosis not present

## 2022-10-16 DIAGNOSIS — E119 Type 2 diabetes mellitus without complications: Secondary | ICD-10-CM | POA: Diagnosis not present

## 2022-11-11 DIAGNOSIS — D3132 Benign neoplasm of left choroid: Secondary | ICD-10-CM | POA: Diagnosis not present

## 2022-11-11 DIAGNOSIS — Z7984 Long term (current) use of oral hypoglycemic drugs: Secondary | ICD-10-CM | POA: Diagnosis not present

## 2022-11-11 DIAGNOSIS — H2513 Age-related nuclear cataract, bilateral: Secondary | ICD-10-CM | POA: Diagnosis not present

## 2022-11-11 DIAGNOSIS — E119 Type 2 diabetes mellitus without complications: Secondary | ICD-10-CM | POA: Diagnosis not present

## 2022-11-26 DIAGNOSIS — Z86010 Personal history of colonic polyps: Secondary | ICD-10-CM | POA: Diagnosis not present

## 2022-11-26 DIAGNOSIS — Z1211 Encounter for screening for malignant neoplasm of colon: Secondary | ICD-10-CM | POA: Diagnosis not present

## 2022-11-26 DIAGNOSIS — Z09 Encounter for follow-up examination after completed treatment for conditions other than malignant neoplasm: Secondary | ICD-10-CM | POA: Diagnosis not present

## 2023-01-13 DIAGNOSIS — E119 Type 2 diabetes mellitus without complications: Secondary | ICD-10-CM | POA: Diagnosis not present

## 2023-02-09 DIAGNOSIS — E785 Hyperlipidemia, unspecified: Secondary | ICD-10-CM | POA: Diagnosis not present

## 2023-02-09 DIAGNOSIS — I10 Essential (primary) hypertension: Secondary | ICD-10-CM | POA: Diagnosis not present

## 2023-02-09 DIAGNOSIS — E063 Autoimmune thyroiditis: Secondary | ICD-10-CM | POA: Diagnosis not present

## 2023-02-09 DIAGNOSIS — Z130 Encounter for screening for diseases of the blood and blood-forming organs and certain disorders involving the immune mechanism: Secondary | ICD-10-CM | POA: Diagnosis not present

## 2023-02-09 DIAGNOSIS — E1169 Type 2 diabetes mellitus with other specified complication: Secondary | ICD-10-CM | POA: Diagnosis not present

## 2023-02-09 DIAGNOSIS — E042 Nontoxic multinodular goiter: Secondary | ICD-10-CM | POA: Diagnosis not present

## 2023-02-09 DIAGNOSIS — Z79899 Other long term (current) drug therapy: Secondary | ICD-10-CM | POA: Diagnosis not present

## 2023-02-09 DIAGNOSIS — E079 Disorder of thyroid, unspecified: Secondary | ICD-10-CM | POA: Diagnosis not present

## 2023-02-10 DIAGNOSIS — E872 Acidosis, unspecified: Secondary | ICD-10-CM | POA: Diagnosis not present

## 2023-02-10 DIAGNOSIS — G35 Multiple sclerosis: Secondary | ICD-10-CM | POA: Diagnosis not present

## 2023-02-10 DIAGNOSIS — K7581 Nonalcoholic steatohepatitis (NASH): Secondary | ICD-10-CM | POA: Diagnosis not present

## 2023-02-10 DIAGNOSIS — E119 Type 2 diabetes mellitus without complications: Secondary | ICD-10-CM | POA: Diagnosis not present

## 2023-02-10 DIAGNOSIS — Z794 Long term (current) use of insulin: Secondary | ICD-10-CM | POA: Diagnosis not present

## 2023-02-10 DIAGNOSIS — K7400 Hepatic fibrosis, unspecified: Secondary | ICD-10-CM | POA: Diagnosis not present

## 2023-03-05 DIAGNOSIS — E042 Nontoxic multinodular goiter: Secondary | ICD-10-CM | POA: Diagnosis not present

## 2023-03-18 DIAGNOSIS — E1169 Type 2 diabetes mellitus with other specified complication: Secondary | ICD-10-CM | POA: Diagnosis not present

## 2023-03-18 DIAGNOSIS — E785 Hyperlipidemia, unspecified: Secondary | ICD-10-CM | POA: Diagnosis not present

## 2023-03-18 DIAGNOSIS — E041 Nontoxic single thyroid nodule: Secondary | ICD-10-CM | POA: Diagnosis not present

## 2023-03-18 DIAGNOSIS — I10 Essential (primary) hypertension: Secondary | ICD-10-CM | POA: Diagnosis not present

## 2023-03-18 DIAGNOSIS — Z23 Encounter for immunization: Secondary | ICD-10-CM | POA: Diagnosis not present

## 2023-03-18 DIAGNOSIS — E063 Autoimmune thyroiditis: Secondary | ICD-10-CM | POA: Diagnosis not present

## 2023-04-06 DIAGNOSIS — E872 Acidosis, unspecified: Secondary | ICD-10-CM | POA: Diagnosis not present

## 2023-04-06 DIAGNOSIS — K7581 Nonalcoholic steatohepatitis (NASH): Secondary | ICD-10-CM | POA: Diagnosis not present

## 2023-04-06 DIAGNOSIS — Z794 Long term (current) use of insulin: Secondary | ICD-10-CM | POA: Diagnosis not present

## 2023-04-06 DIAGNOSIS — Z1289 Encounter for screening for malignant neoplasm of other sites: Secondary | ICD-10-CM | POA: Diagnosis not present

## 2023-04-06 DIAGNOSIS — E119 Type 2 diabetes mellitus without complications: Secondary | ICD-10-CM | POA: Diagnosis not present

## 2023-04-06 DIAGNOSIS — G35 Multiple sclerosis: Secondary | ICD-10-CM | POA: Diagnosis not present

## 2023-04-06 DIAGNOSIS — K7400 Hepatic fibrosis, unspecified: Secondary | ICD-10-CM | POA: Diagnosis not present

## 2023-04-13 DIAGNOSIS — E042 Nontoxic multinodular goiter: Secondary | ICD-10-CM | POA: Diagnosis not present

## 2023-04-13 DIAGNOSIS — E079 Disorder of thyroid, unspecified: Secondary | ICD-10-CM | POA: Diagnosis not present

## 2023-05-12 DIAGNOSIS — Z1331 Encounter for screening for depression: Secondary | ICD-10-CM | POA: Diagnosis not present

## 2023-05-12 DIAGNOSIS — Z1231 Encounter for screening mammogram for malignant neoplasm of breast: Secondary | ICD-10-CM | POA: Diagnosis not present

## 2023-05-12 DIAGNOSIS — Z124 Encounter for screening for malignant neoplasm of cervix: Secondary | ICD-10-CM | POA: Diagnosis not present

## 2023-05-19 DIAGNOSIS — I10 Essential (primary) hypertension: Secondary | ICD-10-CM | POA: Diagnosis not present

## 2023-05-19 DIAGNOSIS — E1169 Type 2 diabetes mellitus with other specified complication: Secondary | ICD-10-CM | POA: Diagnosis not present

## 2023-05-19 DIAGNOSIS — E785 Hyperlipidemia, unspecified: Secondary | ICD-10-CM | POA: Diagnosis not present

## 2023-05-25 DIAGNOSIS — E119 Type 2 diabetes mellitus without complications: Secondary | ICD-10-CM | POA: Diagnosis not present

## 2023-05-26 DIAGNOSIS — L570 Actinic keratosis: Secondary | ICD-10-CM | POA: Diagnosis not present

## 2023-05-26 DIAGNOSIS — Z411 Encounter for cosmetic surgery: Secondary | ICD-10-CM | POA: Diagnosis not present

## 2023-05-26 DIAGNOSIS — L578 Other skin changes due to chronic exposure to nonionizing radiation: Secondary | ICD-10-CM | POA: Diagnosis not present

## 2023-05-26 DIAGNOSIS — L820 Inflamed seborrheic keratosis: Secondary | ICD-10-CM | POA: Diagnosis not present

## 2023-05-26 DIAGNOSIS — L814 Other melanin hyperpigmentation: Secondary | ICD-10-CM | POA: Diagnosis not present

## 2023-05-26 DIAGNOSIS — L821 Other seborrheic keratosis: Secondary | ICD-10-CM | POA: Diagnosis not present

## 2023-05-26 DIAGNOSIS — D225 Melanocytic nevi of trunk: Secondary | ICD-10-CM | POA: Diagnosis not present

## 2023-09-08 DIAGNOSIS — G35 Multiple sclerosis: Secondary | ICD-10-CM | POA: Diagnosis not present

## 2023-09-08 DIAGNOSIS — E782 Mixed hyperlipidemia: Secondary | ICD-10-CM | POA: Diagnosis not present

## 2023-09-08 DIAGNOSIS — E785 Hyperlipidemia, unspecified: Secondary | ICD-10-CM | POA: Diagnosis not present

## 2023-09-08 DIAGNOSIS — E063 Autoimmune thyroiditis: Secondary | ICD-10-CM | POA: Diagnosis not present

## 2023-09-08 DIAGNOSIS — Z1330 Encounter for screening examination for mental health and behavioral disorders, unspecified: Secondary | ICD-10-CM | POA: Diagnosis not present

## 2023-09-08 DIAGNOSIS — E039 Hypothyroidism, unspecified: Secondary | ICD-10-CM | POA: Diagnosis not present

## 2023-09-08 DIAGNOSIS — I10 Essential (primary) hypertension: Secondary | ICD-10-CM | POA: Diagnosis not present

## 2023-09-08 DIAGNOSIS — R7989 Other specified abnormal findings of blood chemistry: Secondary | ICD-10-CM | POA: Diagnosis not present

## 2023-09-08 DIAGNOSIS — E1169 Type 2 diabetes mellitus with other specified complication: Secondary | ICD-10-CM | POA: Diagnosis not present

## 2023-09-10 DIAGNOSIS — E119 Type 2 diabetes mellitus without complications: Secondary | ICD-10-CM | POA: Diagnosis not present

## 2023-10-01 DIAGNOSIS — N3000 Acute cystitis without hematuria: Secondary | ICD-10-CM | POA: Diagnosis not present

## 2023-10-01 DIAGNOSIS — B3731 Acute candidiasis of vulva and vagina: Secondary | ICD-10-CM | POA: Diagnosis not present

## 2023-10-01 DIAGNOSIS — R350 Frequency of micturition: Secondary | ICD-10-CM | POA: Diagnosis not present

## 2023-10-06 DIAGNOSIS — R1031 Right lower quadrant pain: Secondary | ICD-10-CM | POA: Diagnosis not present

## 2023-10-07 DIAGNOSIS — R1031 Right lower quadrant pain: Secondary | ICD-10-CM | POA: Diagnosis not present

## 2023-10-08 DIAGNOSIS — Z9049 Acquired absence of other specified parts of digestive tract: Secondary | ICD-10-CM | POA: Diagnosis not present

## 2023-10-08 DIAGNOSIS — Z1289 Encounter for screening for malignant neoplasm of other sites: Secondary | ICD-10-CM | POA: Diagnosis not present

## 2023-10-08 DIAGNOSIS — K760 Fatty (change of) liver, not elsewhere classified: Secondary | ICD-10-CM | POA: Diagnosis not present

## 2023-12-01 DIAGNOSIS — E872 Acidosis, unspecified: Secondary | ICD-10-CM | POA: Diagnosis not present

## 2023-12-01 DIAGNOSIS — K7581 Nonalcoholic steatohepatitis (NASH): Secondary | ICD-10-CM | POA: Diagnosis not present

## 2023-12-01 DIAGNOSIS — Z7985 Long-term (current) use of injectable non-insulin antidiabetic drugs: Secondary | ICD-10-CM | POA: Diagnosis not present

## 2023-12-01 DIAGNOSIS — Z79899 Other long term (current) drug therapy: Secondary | ICD-10-CM | POA: Diagnosis not present

## 2023-12-01 DIAGNOSIS — Z5181 Encounter for therapeutic drug level monitoring: Secondary | ICD-10-CM | POA: Diagnosis not present

## 2023-12-01 DIAGNOSIS — Z794 Long term (current) use of insulin: Secondary | ICD-10-CM | POA: Diagnosis not present

## 2023-12-01 DIAGNOSIS — K7400 Hepatic fibrosis, unspecified: Secondary | ICD-10-CM | POA: Diagnosis not present

## 2023-12-01 DIAGNOSIS — G35 Multiple sclerosis: Secondary | ICD-10-CM | POA: Diagnosis not present

## 2023-12-01 DIAGNOSIS — Z7984 Long term (current) use of oral hypoglycemic drugs: Secondary | ICD-10-CM | POA: Diagnosis not present

## 2023-12-01 DIAGNOSIS — E119 Type 2 diabetes mellitus without complications: Secondary | ICD-10-CM | POA: Diagnosis not present

## 2023-12-09 DIAGNOSIS — D3132 Benign neoplasm of left choroid: Secondary | ICD-10-CM | POA: Diagnosis not present

## 2023-12-09 DIAGNOSIS — H5213 Myopia, bilateral: Secondary | ICD-10-CM | POA: Diagnosis not present

## 2023-12-09 DIAGNOSIS — Z7984 Long term (current) use of oral hypoglycemic drugs: Secondary | ICD-10-CM | POA: Diagnosis not present

## 2023-12-09 DIAGNOSIS — E119 Type 2 diabetes mellitus without complications: Secondary | ICD-10-CM | POA: Diagnosis not present

## 2024-01-06 DIAGNOSIS — E119 Type 2 diabetes mellitus without complications: Secondary | ICD-10-CM | POA: Diagnosis not present

## 2024-01-06 DIAGNOSIS — Z86711 Personal history of pulmonary embolism: Secondary | ICD-10-CM | POA: Diagnosis not present

## 2024-01-06 DIAGNOSIS — I10 Essential (primary) hypertension: Secondary | ICD-10-CM | POA: Diagnosis not present

## 2024-01-06 DIAGNOSIS — K7581 Nonalcoholic steatohepatitis (NASH): Secondary | ICD-10-CM | POA: Diagnosis not present

## 2024-03-04 DIAGNOSIS — E872 Acidosis, unspecified: Secondary | ICD-10-CM | POA: Diagnosis not present

## 2024-03-04 DIAGNOSIS — K7460 Unspecified cirrhosis of liver: Secondary | ICD-10-CM | POA: Diagnosis not present

## 2024-03-04 DIAGNOSIS — K7581 Nonalcoholic steatohepatitis (NASH): Secondary | ICD-10-CM | POA: Diagnosis not present

## 2024-03-04 DIAGNOSIS — Z9049 Acquired absence of other specified parts of digestive tract: Secondary | ICD-10-CM | POA: Diagnosis not present

## 2024-03-04 DIAGNOSIS — N289 Disorder of kidney and ureter, unspecified: Secondary | ICD-10-CM | POA: Diagnosis not present

## 2024-03-04 DIAGNOSIS — G35 Multiple sclerosis: Secondary | ICD-10-CM | POA: Diagnosis not present

## 2024-03-04 DIAGNOSIS — C220 Liver cell carcinoma: Secondary | ICD-10-CM | POA: Diagnosis not present

## 2024-03-04 DIAGNOSIS — E119 Type 2 diabetes mellitus without complications: Secondary | ICD-10-CM | POA: Diagnosis not present

## 2024-03-04 DIAGNOSIS — K7400 Hepatic fibrosis, unspecified: Secondary | ICD-10-CM | POA: Diagnosis not present

## 2024-03-07 DIAGNOSIS — I10 Essential (primary) hypertension: Secondary | ICD-10-CM | POA: Diagnosis not present

## 2024-03-07 DIAGNOSIS — E039 Hypothyroidism, unspecified: Secondary | ICD-10-CM | POA: Diagnosis not present

## 2024-03-07 DIAGNOSIS — E785 Hyperlipidemia, unspecified: Secondary | ICD-10-CM | POA: Diagnosis not present

## 2024-03-07 DIAGNOSIS — Z79899 Other long term (current) drug therapy: Secondary | ICD-10-CM | POA: Diagnosis not present

## 2024-03-07 DIAGNOSIS — G35 Multiple sclerosis: Secondary | ICD-10-CM | POA: Diagnosis not present

## 2024-03-07 DIAGNOSIS — E1169 Type 2 diabetes mellitus with other specified complication: Secondary | ICD-10-CM | POA: Diagnosis not present

## 2024-04-08 DIAGNOSIS — E872 Acidosis, unspecified: Secondary | ICD-10-CM | POA: Diagnosis not present

## 2024-04-08 DIAGNOSIS — G35D Multiple sclerosis, unspecified: Secondary | ICD-10-CM | POA: Diagnosis not present

## 2024-04-08 DIAGNOSIS — K7581 Nonalcoholic steatohepatitis (NASH): Secondary | ICD-10-CM | POA: Diagnosis not present

## 2024-04-08 DIAGNOSIS — K7400 Hepatic fibrosis, unspecified: Secondary | ICD-10-CM | POA: Diagnosis not present

## 2024-04-08 DIAGNOSIS — E119 Type 2 diabetes mellitus without complications: Secondary | ICD-10-CM | POA: Diagnosis not present

## 2024-04-12 DIAGNOSIS — E039 Hypothyroidism, unspecified: Secondary | ICD-10-CM | POA: Diagnosis not present

## 2024-04-12 DIAGNOSIS — E1169 Type 2 diabetes mellitus with other specified complication: Secondary | ICD-10-CM | POA: Diagnosis not present

## 2024-04-12 DIAGNOSIS — E785 Hyperlipidemia, unspecified: Secondary | ICD-10-CM | POA: Diagnosis not present

## 2024-04-12 DIAGNOSIS — I10 Essential (primary) hypertension: Secondary | ICD-10-CM | POA: Diagnosis not present

## 2024-04-12 DIAGNOSIS — E042 Nontoxic multinodular goiter: Secondary | ICD-10-CM | POA: Diagnosis not present

## 2024-04-14 DIAGNOSIS — E042 Nontoxic multinodular goiter: Secondary | ICD-10-CM | POA: Diagnosis not present

## 2024-04-14 DIAGNOSIS — E890 Postprocedural hypothyroidism: Secondary | ICD-10-CM | POA: Diagnosis not present

## 2024-04-18 DIAGNOSIS — E119 Type 2 diabetes mellitus without complications: Secondary | ICD-10-CM | POA: Diagnosis not present

## 2024-05-12 DIAGNOSIS — Z01419 Encounter for gynecological examination (general) (routine) without abnormal findings: Secondary | ICD-10-CM | POA: Diagnosis not present

## 2024-05-12 DIAGNOSIS — Z1231 Encounter for screening mammogram for malignant neoplasm of breast: Secondary | ICD-10-CM | POA: Diagnosis not present

## 2024-05-12 DIAGNOSIS — Z1331 Encounter for screening for depression: Secondary | ICD-10-CM | POA: Diagnosis not present

## 2024-05-16 DIAGNOSIS — I10 Essential (primary) hypertension: Secondary | ICD-10-CM | POA: Diagnosis not present

## 2024-05-16 DIAGNOSIS — R928 Other abnormal and inconclusive findings on diagnostic imaging of breast: Secondary | ICD-10-CM | POA: Diagnosis not present

## 2024-05-16 DIAGNOSIS — R0602 Shortness of breath: Secondary | ICD-10-CM | POA: Diagnosis not present

## 2024-05-18 DIAGNOSIS — I10 Essential (primary) hypertension: Secondary | ICD-10-CM | POA: Diagnosis not present

## 2024-05-18 DIAGNOSIS — R0609 Other forms of dyspnea: Secondary | ICD-10-CM | POA: Diagnosis not present

## 2024-05-18 DIAGNOSIS — E1169 Type 2 diabetes mellitus with other specified complication: Secondary | ICD-10-CM | POA: Diagnosis not present

## 2024-05-18 DIAGNOSIS — E785 Hyperlipidemia, unspecified: Secondary | ICD-10-CM | POA: Diagnosis not present

## 2024-05-25 DIAGNOSIS — N6325 Unspecified lump in the left breast, overlapping quadrants: Secondary | ICD-10-CM | POA: Diagnosis not present

## 2024-06-01 DIAGNOSIS — R0609 Other forms of dyspnea: Secondary | ICD-10-CM | POA: Diagnosis not present

## 2024-06-01 DIAGNOSIS — I10 Essential (primary) hypertension: Secondary | ICD-10-CM | POA: Diagnosis not present
